# Patient Record
Sex: Female | Born: 1955
Health system: Southern US, Community
[De-identification: ages and names within clinical notes are randomized; demographics above are authoritative.]

## PROBLEM LIST (undated history)

## (undated) DIAGNOSIS — D136 Benign neoplasm of pancreas: Secondary | ICD-10-CM

## (undated) DIAGNOSIS — E119 Type 2 diabetes mellitus without complications: Secondary | ICD-10-CM

## (undated) DIAGNOSIS — F411 Generalized anxiety disorder: Secondary | ICD-10-CM

## (undated) DIAGNOSIS — Z9221 Personal history of antineoplastic chemotherapy: Secondary | ICD-10-CM

## (undated) DIAGNOSIS — E7800 Pure hypercholesterolemia, unspecified: Secondary | ICD-10-CM

## (undated) DIAGNOSIS — M810 Age-related osteoporosis without current pathological fracture: Secondary | ICD-10-CM

## (undated) DIAGNOSIS — Z923 Personal history of irradiation: Secondary | ICD-10-CM

## (undated) DIAGNOSIS — C50919 Malignant neoplasm of unspecified site of unspecified female breast: Secondary | ICD-10-CM

## (undated) DIAGNOSIS — R030 Elevated blood-pressure reading, without diagnosis of hypertension: Secondary | ICD-10-CM

## (undated) DIAGNOSIS — H04559 Acquired stenosis of unspecified nasolacrimal duct: Secondary | ICD-10-CM

## (undated) HISTORY — DX: Elevated blood-pressure reading, without diagnosis of hypertension: R03.0

## (undated) HISTORY — DX: Benign neoplasm of pancreas: D13.6

## (undated) HISTORY — PX: NASAL SEPTUM SURGERY: SHX37

## (undated) HISTORY — PX: BREAST BIOPSY: SHX20

## (undated) HISTORY — DX: Acquired stenosis of unspecified nasolacrimal duct: H04.559

## (undated) HISTORY — PX: BREAST LUMPECTOMY: SHX2

## (undated) HISTORY — DX: Generalized anxiety disorder: F41.1

## (undated) HISTORY — PX: OTHER SURGICAL HISTORY: SHX169

## (undated) HISTORY — DX: Pure hypercholesterolemia, unspecified: E78.00

## (undated) HISTORY — PX: BREAST EXCISIONAL BIOPSY: SUR124

## (undated) HISTORY — DX: Malignant neoplasm of unspecified site of unspecified female breast: C50.919

## (undated) HISTORY — DX: Type 2 diabetes mellitus without complications: E11.9

## (undated) HISTORY — DX: Age-related osteoporosis without current pathological fracture: M81.0

---

## 1993-09-08 HISTORY — PX: OTHER SURGICAL HISTORY: SHX169

## 1998-09-08 HISTORY — PX: OTHER SURGICAL HISTORY: SHX169

## 1998-09-20 ENCOUNTER — Inpatient Hospital Stay (HOSPITAL_COMMUNITY): Admission: RE | Admit: 1998-09-20 | Discharge: 1998-09-26 | Payer: Self-pay | Admitting: Surgery

## 1998-11-08 ENCOUNTER — Other Ambulatory Visit: Admission: RE | Admit: 1998-11-08 | Discharge: 1998-11-08 | Payer: Self-pay | Admitting: Gynecology

## 1999-11-22 ENCOUNTER — Encounter: Payer: Self-pay | Admitting: Gynecology

## 1999-11-22 ENCOUNTER — Encounter: Admission: RE | Admit: 1999-11-22 | Discharge: 1999-11-22 | Payer: Self-pay | Admitting: Gynecology

## 2000-04-07 ENCOUNTER — Other Ambulatory Visit: Admission: RE | Admit: 2000-04-07 | Discharge: 2000-04-07 | Payer: Self-pay | Admitting: Gynecology

## 2000-12-08 ENCOUNTER — Encounter: Admission: RE | Admit: 2000-12-08 | Discharge: 2000-12-08 | Payer: Self-pay | Admitting: Gynecology

## 2000-12-08 ENCOUNTER — Encounter: Payer: Self-pay | Admitting: Gynecology

## 2001-08-03 ENCOUNTER — Other Ambulatory Visit: Admission: RE | Admit: 2001-08-03 | Discharge: 2001-08-03 | Payer: Self-pay | Admitting: Gynecology

## 2001-12-13 ENCOUNTER — Encounter: Admission: RE | Admit: 2001-12-13 | Discharge: 2001-12-13 | Payer: Self-pay | Admitting: Gynecology

## 2001-12-13 ENCOUNTER — Encounter: Payer: Self-pay | Admitting: Gynecology

## 2002-12-15 ENCOUNTER — Encounter: Admission: RE | Admit: 2002-12-15 | Discharge: 2002-12-15 | Payer: Self-pay | Admitting: Gynecology

## 2002-12-15 ENCOUNTER — Other Ambulatory Visit: Admission: RE | Admit: 2002-12-15 | Discharge: 2002-12-15 | Payer: Self-pay | Admitting: Gynecology

## 2002-12-15 ENCOUNTER — Encounter: Payer: Self-pay | Admitting: Gynecology

## 2003-01-31 ENCOUNTER — Encounter: Admission: RE | Admit: 2003-01-31 | Discharge: 2003-05-01 | Payer: Self-pay | Admitting: Pulmonary Disease

## 2003-11-08 ENCOUNTER — Encounter: Admission: RE | Admit: 2003-11-08 | Discharge: 2003-11-08 | Payer: Self-pay | Admitting: Gynecology

## 2004-02-12 ENCOUNTER — Other Ambulatory Visit: Admission: RE | Admit: 2004-02-12 | Discharge: 2004-02-12 | Payer: Self-pay | Admitting: Gynecology

## 2004-09-10 ENCOUNTER — Ambulatory Visit: Payer: Self-pay | Admitting: Pulmonary Disease

## 2004-09-13 ENCOUNTER — Ambulatory Visit: Payer: Self-pay | Admitting: Pulmonary Disease

## 2004-11-11 ENCOUNTER — Ambulatory Visit: Payer: Self-pay | Admitting: Pulmonary Disease

## 2005-02-12 ENCOUNTER — Other Ambulatory Visit: Admission: RE | Admit: 2005-02-12 | Discharge: 2005-02-12 | Payer: Self-pay | Admitting: Gynecology

## 2005-02-12 ENCOUNTER — Ambulatory Visit: Payer: Self-pay | Admitting: Pulmonary Disease

## 2005-02-12 ENCOUNTER — Encounter: Admission: RE | Admit: 2005-02-12 | Discharge: 2005-02-12 | Payer: Self-pay | Admitting: Gynecology

## 2005-02-21 ENCOUNTER — Ambulatory Visit: Payer: Self-pay | Admitting: Pulmonary Disease

## 2005-08-21 ENCOUNTER — Ambulatory Visit: Payer: Self-pay | Admitting: Pulmonary Disease

## 2005-08-25 ENCOUNTER — Ambulatory Visit: Payer: Self-pay | Admitting: Pulmonary Disease

## 2005-11-04 ENCOUNTER — Ambulatory Visit: Payer: Self-pay | Admitting: Pulmonary Disease

## 2006-02-18 ENCOUNTER — Other Ambulatory Visit: Admission: RE | Admit: 2006-02-18 | Discharge: 2006-02-18 | Payer: Self-pay | Admitting: Gynecology

## 2006-02-20 ENCOUNTER — Encounter: Admission: RE | Admit: 2006-02-20 | Discharge: 2006-02-20 | Payer: Self-pay | Admitting: Gynecology

## 2006-06-04 ENCOUNTER — Ambulatory Visit: Payer: Self-pay | Admitting: Pulmonary Disease

## 2006-06-10 ENCOUNTER — Ambulatory Visit: Payer: Self-pay | Admitting: Pulmonary Disease

## 2006-12-23 ENCOUNTER — Ambulatory Visit: Payer: Self-pay | Admitting: Pulmonary Disease

## 2006-12-23 LAB — CONVERTED CEMR LAB
AST: 19 units/L (ref 0–37)
Albumin: 3.7 g/dL (ref 3.5–5.2)
Basophils Absolute: 0.1 10*3/uL (ref 0.0–0.1)
CO2: 32 meq/L (ref 19–32)
Calcium: 9.2 mg/dL (ref 8.4–10.5)
Creatinine, Ser: 0.8 mg/dL (ref 0.4–1.2)
Eosinophils Absolute: 0.3 10*3/uL (ref 0.0–0.6)
GFR calc non Af Amer: 81 mL/min
Glucose, Bld: 120 mg/dL — ABNORMAL HIGH (ref 70–99)
HDL: 47.9 mg/dL (ref >39.0)
Hemoglobin: 11.3 g/dL — ABNORMAL LOW (ref 12.0–15.0)
LDL Cholesterol: 59 mg/dL (ref 0–99)
Lymphocytes Relative: 31.8 % (ref 12.0–46.0)
MCHC: 33.8 g/dL (ref 30.0–36.0)
MCV: 87.1 fL (ref 78.0–100.0)
Monocytes Absolute: 0.7 10*3/uL (ref 0.2–0.7)
Monocytes Relative: 13.5 % — ABNORMAL HIGH (ref 3.0–11.0)
Neutrophils Relative %: 48.5 % (ref 43.0–77.0)
RDW: 12.5 % (ref 11.5–14.6)
Sodium: 144 meq/L (ref 135–145)
Total Bilirubin: 0.6 mg/dL (ref 0.3–1.2)
Triglycerides: 74 mg/dL (ref 0–149)
VLDL: 15 mg/dL (ref 0–40)
WBC: 5.5 10*3/uL (ref 4.5–10.5)

## 2007-02-17 ENCOUNTER — Encounter: Admission: RE | Admit: 2007-02-17 | Discharge: 2007-02-17 | Payer: Self-pay | Admitting: Gynecology

## 2007-02-17 ENCOUNTER — Other Ambulatory Visit: Admission: RE | Admit: 2007-02-17 | Discharge: 2007-02-17 | Payer: Self-pay | Admitting: Gynecology

## 2007-05-17 ENCOUNTER — Ambulatory Visit: Payer: Self-pay | Admitting: Pulmonary Disease

## 2007-05-17 LAB — CONVERTED CEMR LAB
BUN: 18 mg/dL (ref 6–23)
Basophils Absolute: 0 10*3/uL (ref 0.0–0.1)
Chloride: 103 meq/L (ref 96–112)
Eosinophils Absolute: 0.4 10*3/uL (ref 0.0–0.6)
Eosinophils Relative: 5.7 % — ABNORMAL HIGH (ref 0.0–5.0)
GFR calc Af Amer: 98 mL/min
GFR calc non Af Amer: 81 mL/min
HDL: 50 mg/dL (ref >39.0)
Hgb A1c MFr Bld: 6.6 % — ABNORMAL HIGH (ref 4.6–6.0)
LDL Cholesterol: 72 mg/dL (ref 0–99)
Lymphocytes Relative: 39 % (ref 12.0–46.0)
MCHC: 33.6 g/dL (ref 30.0–36.0)
Monocytes Absolute: 0.7 10*3/uL (ref 0.2–0.7)
Monocytes Relative: 11.3 % — ABNORMAL HIGH (ref 3.0–11.0)
Neutro Abs: 2.9 10*3/uL (ref 1.4–7.7)
Potassium: 4.1 meq/L (ref 3.5–5.1)
Sodium: 143 meq/L (ref 135–145)
Vit D, 1,25-Dihydroxy: 34 (ref 20–57)

## 2007-05-25 ENCOUNTER — Ambulatory Visit: Payer: Self-pay | Admitting: Pulmonary Disease

## 2007-12-13 ENCOUNTER — Telehealth: Payer: Self-pay | Admitting: Pulmonary Disease

## 2007-12-21 ENCOUNTER — Ambulatory Visit: Payer: Self-pay | Admitting: Pulmonary Disease

## 2008-01-27 ENCOUNTER — Encounter: Payer: Self-pay | Admitting: Pulmonary Disease

## 2008-02-01 LAB — CONVERTED CEMR LAB
AST: 19 units/L (ref 0–37)
Albumin: 3.9 g/dL (ref 3.5–5.2)
Alkaline Phosphatase: 62 units/L (ref 39–117)
BUN: 17 mg/dL (ref 6–23)
Bacteria, UA: NEGATIVE
Basophils Relative: 1.7 % — ABNORMAL HIGH (ref 0.0–1.0)
Bilirubin Urine: NEGATIVE
Bilirubin, Direct: 0.1 mg/dL (ref 0.0–0.3)
CO2: 33 meq/L — ABNORMAL HIGH (ref 19–32)
Chloride: 102 meq/L (ref 96–112)
Cholesterol: 116 mg/dL (ref 0–200)
Creatinine, Ser: 0.8 mg/dL (ref 0.4–1.2)
Crystals: NEGATIVE
Eosinophils Absolute: 0.5 10*3/uL (ref 0.0–0.7)
Eosinophils Relative: 7.1 % — ABNORMAL HIGH (ref 0.0–5.0)
GFR calc non Af Amer: 81 mL/min
HDL: 42.4 mg/dL (ref >39.0)
Ketones, ur: NEGATIVE mg/dL
MCV: 90 fL (ref 78.0–100.0)
Neutrophils Relative %: 42.6 % — ABNORMAL LOW (ref 43.0–77.0)
Nitrite: NEGATIVE
Potassium: 4.2 meq/L (ref 3.5–5.1)
RBC: 4.12 M/uL (ref 3.87–5.11)
TSH: 1.28 microintl units/mL (ref 0.35–5.50)
Total Bilirubin: 0.6 mg/dL (ref 0.3–1.2)
Total CHOL/HDL Ratio: 2.7
Total Protein: 7.2 g/dL (ref 6.0–8.3)
Triglycerides: 73 mg/dL (ref 0–149)
Urine Glucose: NEGATIVE mg/dL
VLDL: 15 mg/dL (ref 0–40)

## 2008-02-15 ENCOUNTER — Encounter: Payer: Self-pay | Admitting: Pulmonary Disease

## 2008-02-22 ENCOUNTER — Encounter: Admission: RE | Admit: 2008-02-22 | Discharge: 2008-02-22 | Payer: Self-pay | Admitting: Gynecology

## 2008-02-24 DIAGNOSIS — I10 Essential (primary) hypertension: Secondary | ICD-10-CM | POA: Insufficient documentation

## 2008-02-24 DIAGNOSIS — E7800 Pure hypercholesterolemia, unspecified: Secondary | ICD-10-CM | POA: Insufficient documentation

## 2008-02-24 DIAGNOSIS — J45909 Unspecified asthma, uncomplicated: Secondary | ICD-10-CM

## 2008-02-24 DIAGNOSIS — F411 Generalized anxiety disorder: Secondary | ICD-10-CM | POA: Insufficient documentation

## 2008-02-24 DIAGNOSIS — Z794 Long term (current) use of insulin: Secondary | ICD-10-CM

## 2008-02-24 DIAGNOSIS — E1165 Type 2 diabetes mellitus with hyperglycemia: Secondary | ICD-10-CM

## 2008-05-22 ENCOUNTER — Encounter: Payer: Self-pay | Admitting: Pulmonary Disease

## 2008-06-05 ENCOUNTER — Telehealth: Payer: Self-pay | Admitting: Pulmonary Disease

## 2008-06-12 ENCOUNTER — Ambulatory Visit: Payer: Self-pay | Admitting: Pulmonary Disease

## 2008-06-22 ENCOUNTER — Ambulatory Visit: Payer: Self-pay | Admitting: Pulmonary Disease

## 2008-06-22 DIAGNOSIS — Z853 Personal history of malignant neoplasm of breast: Secondary | ICD-10-CM

## 2008-06-22 DIAGNOSIS — J309 Allergic rhinitis, unspecified: Secondary | ICD-10-CM | POA: Insufficient documentation

## 2008-06-22 DIAGNOSIS — D136 Benign neoplasm of pancreas: Secondary | ICD-10-CM

## 2008-06-22 DIAGNOSIS — H04559 Acquired stenosis of unspecified nasolacrimal duct: Secondary | ICD-10-CM

## 2008-06-22 LAB — CONVERTED CEMR LAB
AST: 20 units/L (ref 0–37)
Albumin: 4 g/dL (ref 3.5–5.2)
Alkaline Phosphatase: 64 units/L (ref 39–117)
BUN: 18 mg/dL (ref 6–23)
Bilirubin, Direct: 0.2 mg/dL (ref 0.0–0.3)
Calcium: 10 mg/dL (ref 8.4–10.5)
Cholesterol: 137 mg/dL (ref 0–200)
Glucose, Bld: 163 mg/dL — ABNORMAL HIGH (ref 70–99)
HDL: 46.4 mg/dL (ref >39.0)
Hgb A1c MFr Bld: 6.8 % — ABNORMAL HIGH (ref 4.6–6.0)
LDL Cholesterol: 76 mg/dL (ref 0–99)
Potassium: 4 meq/L (ref 3.5–5.1)
Sodium: 142 meq/L (ref 135–145)
Total CHOL/HDL Ratio: 3
Total Protein: 7.2 g/dL (ref 6.0–8.3)
Triglycerides: 71 mg/dL (ref 0–149)

## 2009-01-18 ENCOUNTER — Telehealth: Payer: Self-pay | Admitting: Pulmonary Disease

## 2009-01-25 ENCOUNTER — Ambulatory Visit: Payer: Self-pay | Admitting: Pulmonary Disease

## 2009-02-22 LAB — CONVERTED CEMR LAB
Albumin: 4.1 g/dL (ref 3.5–5.2)
Alkaline Phosphatase: 63 units/L (ref 39–117)
BUN: 22 mg/dL (ref 6–23)
Basophils Relative: 0.3 % (ref 0.0–3.0)
Bilirubin, Direct: 0.2 mg/dL (ref 0.0–0.3)
Creatinine, Ser: 0.8 mg/dL (ref 0.4–1.2)
Eosinophils Absolute: 0.3 10*3/uL (ref 0.0–0.7)
Eosinophils Relative: 5.2 % — ABNORMAL HIGH (ref 0.0–5.0)
Glucose, Bld: 124 mg/dL — ABNORMAL HIGH (ref 70–99)
Hemoglobin: 12.6 g/dL (ref 12.0–15.0)
Hgb A1c MFr Bld: 7 % — ABNORMAL HIGH (ref 4.6–6.5)
LDL Cholesterol: 81 mg/dL (ref 0–99)
Lymphocytes Relative: 34.7 % (ref 12.0–46.0)
MCHC: 33.7 g/dL (ref 30.0–36.0)
MCV: 89.8 fL (ref 78.0–100.0)
Monocytes Absolute: 0.5 10*3/uL (ref 0.1–1.0)
Monocytes Relative: 8.1 % (ref 3.0–12.0)
RDW: 12.1 % (ref 11.5–14.6)
Sodium: 141 meq/L (ref 135–145)
Total Protein: 7.5 g/dL (ref 6.0–8.3)
Triglycerides: 104 mg/dL (ref 0.0–149.0)
VLDL: 20.8 mg/dL (ref 0.0–40.0)
Vit D, 25-Hydroxy: 69 ng/mL (ref 30–89)
WBC: 6.6 10*3/uL (ref 4.5–10.5)

## 2009-02-26 ENCOUNTER — Encounter: Admission: RE | Admit: 2009-02-26 | Discharge: 2009-02-26 | Payer: Self-pay | Admitting: Gynecology

## 2009-04-16 ENCOUNTER — Encounter: Payer: Self-pay | Admitting: Pulmonary Disease

## 2009-05-28 ENCOUNTER — Encounter: Payer: Self-pay | Admitting: Pulmonary Disease

## 2009-06-20 ENCOUNTER — Telehealth: Payer: Self-pay | Admitting: Pulmonary Disease

## 2009-07-06 ENCOUNTER — Encounter: Admission: RE | Admit: 2009-07-06 | Discharge: 2009-07-06 | Payer: Self-pay | Admitting: Otolaryngology

## 2009-07-11 ENCOUNTER — Ambulatory Visit: Payer: Self-pay | Admitting: Pulmonary Disease

## 2009-07-18 ENCOUNTER — Ambulatory Visit: Payer: Self-pay | Admitting: Pulmonary Disease

## 2009-07-18 LAB — CONVERTED CEMR LAB
BUN: 23 mg/dL (ref 6–23)
Chloride: 99 meq/L (ref 96–112)
Glucose, Bld: 97 mg/dL (ref 70–99)
HDL: 49.1 mg/dL (ref >39.00)
Sodium: 141 meq/L (ref 135–145)
Total CHOL/HDL Ratio: 3

## 2010-01-25 ENCOUNTER — Telehealth (INDEPENDENT_AMBULATORY_CARE_PROVIDER_SITE_OTHER): Payer: Self-pay | Admitting: *Deleted

## 2010-02-05 ENCOUNTER — Ambulatory Visit: Payer: Self-pay | Admitting: Pulmonary Disease

## 2010-02-19 LAB — CONVERTED CEMR LAB
AST: 21 units/L (ref 0–37)
Albumin: 4 g/dL (ref 3.5–5.2)
Alkaline Phosphatase: 71 units/L (ref 39–117)
Basophils Absolute: 0 10*3/uL (ref 0.0–0.1)
CO2: 32 meq/L (ref 19–32)
Chloride: 99 meq/L (ref 96–112)
Cholesterol: 153 mg/dL (ref 0–200)
Creatinine, Ser: 0.7 mg/dL (ref 0.4–1.2)
Creatinine,U: 133.2 mg/dL
Glucose, Bld: 144 mg/dL — ABNORMAL HIGH (ref 70–99)
HCT: 36.5 % (ref 36.0–46.0)
Hemoglobin: 12.2 g/dL (ref 12.0–15.0)
Hgb A1c MFr Bld: 7.4 % — ABNORMAL HIGH (ref 4.6–6.5)
MCV: 90.1 fL (ref 78.0–100.0)
Microalb Creat Ratio: 3.2 mg/g (ref 0.0–30.0)
Monocytes Relative: 13.3 % — ABNORMAL HIGH (ref 3.0–12.0)
Neutro Abs: 3.1 10*3/uL (ref 1.4–7.7)
Sodium: 140 meq/L (ref 135–145)
Total Bilirubin: 0.4 mg/dL (ref 0.3–1.2)
Total Protein: 7.3 g/dL (ref 6.0–8.3)
VLDL: 10.4 mg/dL (ref 0.0–40.0)

## 2010-03-13 ENCOUNTER — Encounter: Payer: Self-pay | Admitting: Pulmonary Disease

## 2010-03-13 ENCOUNTER — Encounter: Admission: RE | Admit: 2010-03-13 | Discharge: 2010-03-13 | Payer: Self-pay | Admitting: Gynecology

## 2010-06-04 ENCOUNTER — Encounter: Payer: Self-pay | Admitting: Pulmonary Disease

## 2010-07-01 ENCOUNTER — Ambulatory Visit: Payer: Self-pay | Admitting: Pulmonary Disease

## 2010-07-02 LAB — CONVERTED CEMR LAB
BUN: 20 mg/dL (ref 6–23)
CO2: 30 meq/L (ref 19–32)
Calcium: 9.6 mg/dL (ref 8.4–10.5)
Chloride: 102 meq/L (ref 96–112)
GFR calc non Af Amer: 81.23 mL/min (ref >60)
Glucose, Bld: 120 mg/dL — ABNORMAL HIGH (ref 70–99)

## 2010-07-09 ENCOUNTER — Ambulatory Visit: Payer: Self-pay | Admitting: Pulmonary Disease

## 2010-07-09 ENCOUNTER — Telehealth (INDEPENDENT_AMBULATORY_CARE_PROVIDER_SITE_OTHER): Payer: Self-pay | Admitting: *Deleted

## 2010-07-16 ENCOUNTER — Telehealth (INDEPENDENT_AMBULATORY_CARE_PROVIDER_SITE_OTHER): Payer: Self-pay | Admitting: *Deleted

## 2010-10-02 ENCOUNTER — Telehealth: Payer: Self-pay | Admitting: Pulmonary Disease

## 2010-10-08 NOTE — Letter (Signed)
Summary: [facility]  [facility]   Imported By: Sherian Rein 06/11/2010 10:46:56  _____________________________________________________________________  External Attachment:    Type:   Image     Comment:   External Document

## 2010-10-08 NOTE — Progress Notes (Signed)
Summary: 6 month labs  Phone Note Call from Patient Call back at [facility] Phone [phone]   Caller: Patient Call For: nadel Summary of Call: pt was just seen. she forgot to ask nurse if she needs to do labs 6 months from now. she says that she normally does labs q6 months. pt instructions were to return in 1 yr (appt has been made for this). if pt needs to do labs in 6 months- call home # w/ appt/ date/ time for labs and she will put this on her calendar. (ok to leave detailed msg on phone).  Initial call taken by: Tivis Ringer, CNA,  July 09, 2010 3:34 PM  Follow-up for Phone Call        pt states that she usually gets  fasting labs drawn every 6 months, but she forgot to ask DR. Kriste Basque about this at her appt today. Her instruction states to follow-up in 1 year. Does SN want her to come in 6 months to have labs drawn or wait until 1 year? Please advise. Carron Curie CMA  July 09, 2010 3:55 PM   Additional Follow-up for Phone Call Additional follow up Details #1::        per SN---ok for pt to have fasting labs in 6 months---lip-272.0/bmp-401.9/hepat-790.5/tsh-244.9/cbcd-285.9/a1c-250.00/urine microalbumin-250.00.  thanks Randell Loop CMA  July 15, 2010 3:24 PM     Additional Follow-up for Phone Call Additional follow up Details #2::    Orders placed in IDX for qst wk of May.  LMOM for pt to be aware of this. Follow-up by: Vernie Murders,  July 15, 2010 3:45 PM

## 2010-10-08 NOTE — Progress Notes (Signed)
Summary: lipitor rx-LMTCBx  1  Phone Note Call from Patient Call back at Home Phone [phone]   Caller: Patient Call For: nadel Summary of Call: pt requests a rx sent to [facility] on [address] for lipitor- needs 30 days supply for 1 yr- 10mg .  Initial call taken by: Tivis Ringer, CNA,  July 16, 2010 3:40 PM  Follow-up for Phone Call        We justy gave her rx for all meds on 07/09/10 including this one for a 90 day supply- LMOMTCB to clarify msg Vernie Murders  July 16, 2010 4:01 PM   Additional Follow-up for Phone Call Additional follow up Details #1::        Spoke with pt.  She states that she is needing 30 day supply of med so that she can get 4 dollar copay and also the rx just states "as directed"- so she had to admit to only taking 1/2 of 20 mg tablet and they state this needs to be changed to 10 mg daily and she can still get the 4 dollar copay.  Rx was sent to pharm. Additional Follow-up by: Vernie Murders,  July 16, 2010 4:14 PM    New/Updated Medications: LIPITOR 10 MG TABS (ATORVASTATIN CALCIUM) 1 by mouth once daily Prescriptions: LIPITOR 10 MG TABS (ATORVASTATIN CALCIUM) 1 by mouth once daily  #30 x 11   Entered by:   Vernie Murders   Authorized by:   Michele Mcalpine MD   Signed by:   Vernie Murders on 07/16/2010   Method used:   Electronically to        [facility] (retail)       [address]       [place], [place]  [address]       Ph: [phone]       Fax: [phone]   RxID:   [id number]

## 2010-10-08 NOTE — Assessment & Plan Note (Signed)
Summary: rov ///kp   CC:  Yearly ROV & review of mult medical problems....  History of Present Illness: 55 y/o WF here for a follow up visit... she has multiple medical problems as noted below...    ~  Oct09:  she has had a remarkably good year- no signif medical issues & doing well without new complaints or concerns... she would like to get her meds refilled today... she can't take the flu shots due to an allergy yrs ago to egg whites...   ~  November 10,2010:  she reports another good yr... she saw DrKraus for sporadic intense ear pain & he couldn't find anything wrong- did CT & this is reported neg as well... ?related to fillings/ dental? and she has appt soon to check this...    ~  July 09, 2010:  she continues to do well- no new complaints or concerns... she had labs earlier this yr- all looked good 7 BS was 120-140 w/ A1c= 7.1 to 7.4 on her Metformin & Januvia... she saw DrHecker 9/11- no retinopathy... breathing is good w/o exac & hasn't needed her Proair... she sees DrLomax for GYN & he follows her BMD as well.    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  ALLERGIC RHINITIS (ICD-477.9) - life-long hx of allergic rhinitis and asthma, with eczema as well... allergy testing in 80's w/ markedly pos reactions to trees, grasses, molds, dust, dogs, cats, feathers, shellfish, eggs & other foods including wheat, spinach, beans... ? if IgE / Rast checked- we don't have Vol I of her old chart avail... prev nasal polyp surgery in 1980's by DrKraus...  ASTHMA (ICD-493.90) - hx of asthma and allergies well controlled on SINGULAIR 10mg Qod & PROAIR HFA Prn (hasn't needed)... very active, exercising some & no problems w/ breathing...  ~  11/10: f/u CXR clear... we discussed trying to wean the Singulair slowly...  ~  11/11: states she's using the Singulair Qod & hasn't needed the Proair  rescue.  HYPERTENSION, BORDERLINE (ICD-401.9) - on diet alone... weight = 160#,  5\' 9"  tall for a BMI = 24...  BP= 122/84 today & she denies HA, fatigue, visual changes, CP, palipit, dizziness, syncope, dyspnea, edema, etc...   HYPERCHOLESTEROLEMIA (ICD-272.0) - on LIPITOR 20mg - 1/2 tab daily... takes med regularly & tolerates well...   ~  FLP 9/08 showed TChol 132, TG 52, HDL 50, LDL 72... rec- continue same med.  ~  FLP 10/09 showed TChol 137, TG 71, HDL 46, LDL 76... rec- continue same.  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 on METFORMIN ER 500mg - 2Tabs /d,  & JANUVIA 100mg /d... BS at home are 100-120 range... NOTE: she had SPLENECTOMY at the same time.  ~  labs 9/08 showed BS= 125,  HgA1c= 6.6.Marland KitchenMarland Kitchen rec- same meds + diet.  ~  Ophthalmology= DrHecker 9/09 no diabetic retinopathy...  ~  labs 10/09 showed BS= 163,  HgA1c= 6.8.Marland Kitchen. not as good w/ weight gain, rec- same med + diet.  ~  labs 11/10 showed BS= 97, A1c= 7.0.Marland KitchenMarland Kitchen reminder to avoid sweets etc...  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  Hx of ADENOCARCINOMA, BREAST (ICD-174.9) - right breast cancer diagnosed in 2000 w/ stage IIA disease... right lumpectomy & node bx by DrSreck, XRT by DrGoodchild, & CMF chemotherapy by DrSherrill... no known recurrence in f/u and "released" by DrSherrill.  ~  f/u Mammogram 6/10 at [facility] was neg...  OSTEOPOROSIS (ICD-733.00) - prev on Actonel 35mg /wk, calcium, vitamins, Vit D 2000 u daily (off Actonel now per DrLomax)...  ~  BMD by DrLomax 2007 showed TScores -2.1 in Spine, & -1.7 in [facility]...  ~  BMD by DrLomax in 2009- we don't have these  results...  ~  BMD by DrLomax 7/11 showed TScores -2.0 Spine, & -2.2 FemNeck  ANXIETY (ICD-300.00) - on ALPRAZOLAM 0.5mg  Prn...  Health Maintenance - takes ASA 81mg /d... GYN= DrLomax for PAP, Mammograms [facility]), and BMD's... had PNEUMOVAX 2000... can't get flu shots w/ allergy to eggs...   Allergies (verified): 1)  ! * Albumin  Comments:  Nurse/Medical Assistant: The patient's medications and allergies were reviewed with the patient and were updated in the Medication and Allergy Lists. 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 mother deceased age 43 from leukemia father deceased age 88 from liver cancer 1 sibling alive age 77 1 sibling alive age 32 1 sibling alive age 79 1 sibling alive age 30  Social History: Reviewed history from 07/18/2009 and no changes required. 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  The patient denies fever, chills, sweats, anorexia, fatigue, weakness, malaise, weight loss, sleep disorder, blurring, diplopia, eye irritation, eye discharge, vision loss, eye pain, photophobia, earache, ear discharge, tinnitus, decreased hearing, nasal congestion, nosebleeds, sore throat, hoarseness, chest pain,  palpitations, syncope, orthopnea, PND, peripheral edema, cough, dyspnea at rest, excessive sputum, hemoptysis, wheezing, pleurisy, nausea, vomiting, diarrhea, constipation, change in bowel habits, abdominal pain, melena, hematochezia, jaundice, gas/bloating, indigestion/heartburn, dysphagia, odynophagia, dysuria, hematuria, urinary frequency, urinary hesitancy, nocturia, incontinence, back pain, joint pain, joint swelling, muscle cramps, muscle weakness, stiffness, arthritis, sciatica, restless legs, leg pain at night, leg pain with exertion, rash, itching, dryness, suspicious lesions, paralysis, paresthesias, seizures, tremors, vertigo, transient blindness, frequent falls, frequent headaches, difficulty walking, depression, anxiety, memory loss, confusion, cold intolerance, heat intolerance, polydipsia, polyphagia, polyuria, unusual weight change, abnormal bruising, bleeding, enlarged lymph nodes, urticaria, allergic rash, hay fever, and recurrent infections.    Vital Signs:  Patient profile:   55 year old female Height:      69 inches Weight:      160.25 pounds BMI:     23.75 O2 Sat:      98 % on Room air Temp:     98.9 degrees F oral Pulse rate:   72 / minute BP sitting:   122 / 84  (left arm) Cuff size:   regular  Vitals Entered By: Boone Master CNA/MA (July 09, 2010 2:36 PM)  O2 Flow:  Room air  Physical Exam  Additional Exam:  WD, WN, 55 y/o WF in NAD... GENERAL:  Alert & oriented; pleasant & cooperative... HEENT:  [place]/AT, EOM-wnl, PERRLA, EACs-clear, TMs-wnl, NOSE-clear, THROAT-clear & wnl. NECK:  Supple w/ full ROM; no JVD; normal carotid impulses w/o bruits; no thyromegaly or nodules palpated; no lymphadenopathy. Scar on neck from remote birthmark removal... CHEST:  Clear to P & A; without wheezes/ rales/ or rhonchi. Right breast scar from lumpectomy... HEART:  Regular Rhythm; without murmurs/ rubs/ or gallops. ABDOMEN:  Scar of surg, soft & nontender; normal bowel sounds; no  organomegaly or masses detected. EXT: without deformities or arthritic changes; no varicose veins/ venous insuffic/ or edema. NEURO:  CN's intact; motor testing normal; sensory testing normal; gait normal & balance OK. DERM:  No lesions noted; no rash etc...    Impression & Recommendations:  Problem # 1:  ROUTINE GENERAL MEDICAL EXAM@HEALTH  CARE FACL (ICD-V70.0) Meds refilled per request...  Problem # 2:  ASTHMA (ICD-493.90) Stable>  continue meds. Her updated medication list for this problem includes:    Singulair 10 Mg Tabs (Montelukast sodium) .Marland Kitchen... Take 1 tablet by mouth once a day as needed    Proair Hfa 108 (90 Base) Mcg/act Aers (Albuterol sulfate) ..... Inhale 2 puffs q 4 h as needed for wheezing...  Problem # 3:  HYPERTENSION, BORDERLINE (ICD-401.9) Controlled on diet Rx...  Problem # 4:  HYPERCHOLESTEROLEMIA (ICD-272.0) Controlled on Lip20... Her updated medication list for this problem includes:    Lipitor 20 Mg Tabs (Atorvastatin calcium) .Marland Kitchen... Take as directed...  Problem # 5:  DIABETES MELLITUS (ICD-250.00) She is stable but would love to see the A1c in the 6's... continue meds + diet Rx... Her updated medication list for this problem includes:    Aspirin Adult Low Strength 81 Mg Tbec (Aspirin) .Marland Kitchen... Take 1 tablet by mouth once a day    Metformin Hcl 500 Mg Xr24h-tab (Metformin hcl) .Marland Kitchen... Take 2 tabs by mouth once daily...    Januvia 100 Mg Tabs (Sitagliptin phosphate) .Marland Kitchen... Take 1 tablet by mouth once a day  Problem # 6:  Hx of ADENOCARCINOMA, BREAST (ICD-174.9) Aware>  stable w/o known recurrence...  Problem # 7:  OSTEOPOROSIS (ICD-733.00) Managed by DrLomax... The following medications were removed from the medication list:    Actonel 35 Mg Tabs (Risedronate sodium) .Marland Kitchen... Take one tablet by mouth once every month as directed by dr. Nicholas Lose  Problem # 8:  OTHER MEDICAL PROBLEMS AS NOTED>>  Complete Medication List: 1)  Singulair 10 Mg Tabs (Montelukast  sodium) .... Take 1 tablet by mouth once a day as needed 2)  Proair Hfa 108 (90 Base) Mcg/act Aers (Albuterol sulfate) .... Inhale 2 puffs q 4 h as needed for wheezing... 3)  Aspirin Adult Low Strength 81 Mg Tbec (Aspirin) .... Take 1 tablet by mouth once a day 4)  Lipitor 20 Mg Tabs (Atorvastatin calcium) .... Take as directed... 5)  Metformin Hcl 500 Mg Xr24h-tab (Metformin hcl) .... Take 2 tabs by mouth once daily.Marland KitchenMarland Kitchen 6)  Januvia 100 Mg Tabs (Sitagliptin phosphate) .... Take 1 tablet by mouth once a day 7)  Calcium 600/vitamin D 600-400 Mg-unit Tabs (Calcium carbonate-vitamin d) .... Take 2 tablets by mouth once daily as directed by dr. Nicholas Lose 8)  Daily Multiple Vitamins Tabs (Multiple vitamin) .... Take 1 tablet by mouth once a day 9)  Vitamin D3 2000 Unit  Caps (Cholecalciferol) .... Take 1 capsule by mouth once a day 10)  Alprazolam 0.5 Mg Tabs (Alprazolam) .... Take 1/2 to 1 tablet by mouth three times a day as needed...  Patient Instructions: 1)  Today we updated your med list- see below.... 2)  We refilled your meds for 90d supplies as requested... 3)  Keep up the great job w/ diet & weight bearing exercise... 4)  Call for any problems.Marland KitchenMarland Kitchen 5)  Please schedule a follow-up appointment in 1 year. Prescriptions: ALPRAZOLAM 0.5 MG  TABS (ALPRAZOLAM) take 1/2 to 1 tablet by mouth three times a day as needed...  #90 x 6   Entered and Authorized by:   Michele Mcalpine MD   Signed by:   Michele Mcalpine MD on 07/09/2010   Method used:   Print then Give to Patient   RxID:   [id number] JANUVIA 100 MG  TABS (SITAGLIPTIN PHOSPHATE) Take 1 tablet by mouth once a day  #90 x 4   Entered and Authorized by:   Michele Mcalpine MD   Signed by:   Michele Mcalpine MD on 07/09/2010   Method used:   Print then Give to Patient   RxID:   [id number] METFORMIN HCL 500 MG XR24H-TAB (METFORMIN HCL) take 2 tabs by mouth once daily...  #180 x 4   Entered and Authorized by:   Michele Mcalpine MD   Signed by:   Michele Mcalpine MD on 07/09/2010   Method used:   Print then Give to Patient   RxID:   [id number] LIPITOR 20 MG TABS (ATORVASTATIN CALCIUM) take as directed...  #90 x 4   Entered and Authorized by:   Michele Mcalpine MD   Signed by:   Michele Mcalpine MD on 07/09/2010   Method used:   Print then Give to Patient   RxID:   [id number] PROAIR HFA 108 (90 BASE) MCG/ACT AERS (ALBUTEROL SULFATE) inhale 2 puffs Q 4 H as needed for wheezing...  #1 x prn   Entered and Authorized by:   Michele Mcalpine MD   Signed by:   Michele Mcalpine MD on 07/09/2010   Method used:   Print then Give to Patient   RxID:   [id number] SINGULAIR 10 MG  TABS (MONTELUKAST SODIUM) Take 1 tablet by mouth once a day as needed  #90 x 4   Entered and Authorized by:   Michele Mcalpine MD   Signed by:   Michele Mcalpine MD on 07/09/2010   Method used:   Print then Give to Patient   RxID:   [id number]

## 2010-10-08 NOTE — Progress Notes (Signed)
Summary: set up labs  Phone Note Call from Patient Call back at Work Phone [phone]   Caller: Patient Call For: nadel Summary of Call: pt request that her "6 months fasting labs be set up".  Initial call taken by: Tivis Ringer, CNA,  Jan 25, 2010 9:38 AM  Follow-up for Phone Call        pls mark labs for pt and put in idx   Philipp Deputy CMA  Jan 25, 2010 9:48 AM    per SN---ok for pt to have labs done--flp-bmp-hepat-cbcd-tsh--v70.0  and a1c and microalbumin-250.00.  lmomtcb for pt to see when she will come in for the blood work. Randell Loop CMA  Jan 25, 2010 2:30 PM   Additional Follow-up for Phone Call Additional follow up Details #1::        labs are in computer for pt on 5-31 Randell Loop [facility]  Feb 01, 2010 1:57 PM

## 2010-10-10 NOTE — Progress Notes (Signed)
Summary: appointment today  Phone Note Call from Patient   Caller: Patient Call For: DR Lanea Vankirk Summary of Call: Patient phoned and would like to be seen today. Dr. Kriste Basque nor Babette Relic has anything today.  She started with head congestion but thinks it has moved to her lungs. She was coughing up a dark mucus this morning. She can be reached at [phone]. Pt uses [facility] at [facility] Initial call taken by: Vedia Coffer,  October 02, 2010 9:16 AM  Follow-up for Phone Call        Spoke with pt.  She is c/o prod cough with brown sputum since this am.  She states that she started to have sinus pressure and congestion 4 days ago and now it has moved to chest.  Chest feels tight.  She requested ov, I offered her appt with TP for tommorrow and she states that this can not wait.  Pls advise thanks! allergic to albumin Follow-up by: Vernie Murders,  October 02, 2010 9:29 AM  Additional Follow-up for Phone Call Additional follow up Details #1::        per SN---ok for pt to have augmentin 875  #14  1 by mouth two times a day  and use mucinex 600mg   2 by mouth two times a day with plenty of fluids.  called and spoke with pt and she is aware of med recs per SN---will call for any further problems Randell Loop CMA  October 02, 2010 1:44 PM     New/Updated Medications: AUGMENTIN 875-125 MG TABS (AMOXICILLIN-POT CLAVULANATE) take one tablet by mouth two times a day until gone---generic is ok Prescriptions: AUGMENTIN 875-125 MG TABS (AMOXICILLIN-POT CLAVULANATE) take one tablet by mouth two times a day until gone---generic is ok  #14 x 0   Entered by:   Randell Loop CMA   Authorized by:   Michele Mcalpine MD   Signed by:   Randell Loop CMA on 10/02/2010   Method used:   Electronically to        [facility] (retail)       [address]       [place], [place]  [address]       Ph: [phone]       Fax: [phone]   RxID:   [id number]

## 2011-01-07 ENCOUNTER — Other Ambulatory Visit: Payer: Self-pay

## 2011-01-07 ENCOUNTER — Other Ambulatory Visit: Payer: Self-pay | Admitting: Pulmonary Disease

## 2011-01-07 DIAGNOSIS — I10 Essential (primary) hypertension: Secondary | ICD-10-CM

## 2011-01-07 DIAGNOSIS — E039 Hypothyroidism, unspecified: Secondary | ICD-10-CM

## 2011-01-07 DIAGNOSIS — E119 Type 2 diabetes mellitus without complications: Secondary | ICD-10-CM

## 2011-01-07 DIAGNOSIS — R748 Abnormal levels of other serum enzymes: Secondary | ICD-10-CM

## 2011-01-07 DIAGNOSIS — E785 Hyperlipidemia, unspecified: Secondary | ICD-10-CM

## 2011-01-07 DIAGNOSIS — D649 Anemia, unspecified: Secondary | ICD-10-CM

## 2011-01-23 ENCOUNTER — Telehealth: Payer: Self-pay | Admitting: Pulmonary Disease

## 2011-01-23 NOTE — Telephone Encounter (Signed)
Called and spoke with pt and she is aware that her lab order is still in the computer and she can come in still for her labs.  Pt voiced her understanding.

## 2011-01-27 ENCOUNTER — Other Ambulatory Visit (INDEPENDENT_AMBULATORY_CARE_PROVIDER_SITE_OTHER): Payer: BC Managed Care – PPO

## 2011-01-27 DIAGNOSIS — D649 Anemia, unspecified: Secondary | ICD-10-CM

## 2011-01-27 DIAGNOSIS — E119 Type 2 diabetes mellitus without complications: Secondary | ICD-10-CM

## 2011-01-27 DIAGNOSIS — R748 Abnormal levels of other serum enzymes: Secondary | ICD-10-CM

## 2011-01-27 DIAGNOSIS — E039 Hypothyroidism, unspecified: Secondary | ICD-10-CM

## 2011-01-27 DIAGNOSIS — E785 Hyperlipidemia, unspecified: Secondary | ICD-10-CM

## 2011-01-27 DIAGNOSIS — I10 Essential (primary) hypertension: Secondary | ICD-10-CM

## 2011-01-27 LAB — CBC WITH DIFFERENTIAL/PLATELET
Eosinophils Absolute: 0.4 10*3/uL (ref 0.0–0.7)
HCT: 38.2 % (ref 36.0–46.0)
Lymphs Abs: 2.4 10*3/uL (ref 0.7–4.0)
MCHC: 33.8 g/dL (ref 30.0–36.0)
Monocytes Relative: 11.2 % (ref 3.0–12.0)
Platelets: 314 10*3/uL (ref 150.0–400.0)
RDW: 13.5 % (ref 11.5–14.6)
WBC: 7.1 10*3/uL (ref 4.5–10.5)

## 2011-01-27 LAB — BASIC METABOLIC PANEL
Calcium: 9.8 mg/dL (ref 8.4–10.5)
Creatinine, Ser: 0.8 mg/dL (ref 0.4–1.2)
Glucose, Bld: 131 mg/dL — ABNORMAL HIGH (ref 70–99)

## 2011-01-27 LAB — LIPID PANEL
HDL: 49.9 mg/dL (ref >39.00)
Total CHOL/HDL Ratio: 3
Triglycerides: 104 mg/dL (ref 0.0–149.0)

## 2011-01-27 LAB — MICROALBUMIN / CREATININE URINE RATIO
Creatinine,U: 269.4 mg/dL
Microalb Creat Ratio: 2.3 mg/g (ref 0.0–30.0)
Microalb, Ur: 6.2 mg/dL — ABNORMAL HIGH (ref 0.0–1.9)

## 2011-01-27 LAB — HEPATIC FUNCTION PANEL: Bilirubin, Direct: 0 mg/dL (ref 0.0–0.3)

## 2011-02-03 ENCOUNTER — Other Ambulatory Visit: Payer: Self-pay | Admitting: Pulmonary Disease

## 2011-02-05 ENCOUNTER — Other Ambulatory Visit: Payer: Self-pay | Admitting: Pulmonary Disease

## 2011-02-05 DIAGNOSIS — Z0000 Encounter for general adult medical examination without abnormal findings: Secondary | ICD-10-CM | POA: Insufficient documentation

## 2011-02-11 ENCOUNTER — Other Ambulatory Visit: Payer: Self-pay | Admitting: *Deleted

## 2011-02-11 MED ORDER — ALPRAZOLAM 0.5 MG PO TABS: 0.5000 mg | ORAL_TABLET | Freq: Three times a day (TID) | ORAL | Status: DC | PRN

## 2011-03-17 ENCOUNTER — Other Ambulatory Visit: Payer: Self-pay | Admitting: Gynecology

## 2011-03-17 DIAGNOSIS — Z1231 Encounter for screening mammogram for malignant neoplasm of breast: Secondary | ICD-10-CM

## 2011-03-26 ENCOUNTER — Ambulatory Visit
Admission: RE | Admit: 2011-03-26 | Discharge: 2011-03-26 | Disposition: A | Discharge status: Home / Self Care | Payer: BC Managed Care – PPO | Source: Ambulatory Visit | Attending: Gynecology | Admitting: Gynecology

## 2011-03-26 DIAGNOSIS — Z1231 Encounter for screening mammogram for malignant neoplasm of breast: Secondary | ICD-10-CM

## 2011-04-21 ENCOUNTER — Telehealth: Payer: Self-pay | Admitting: Pulmonary Disease

## 2011-04-21 MED ORDER — ATORVASTATIN CALCIUM 10 MG PO TABS: ORAL_TABLET | ORAL | Status: DC

## 2011-04-21 NOTE — Telephone Encounter (Signed)
Spoke with pt and is aware generic lipitor was sent to pharmacy. Pt verbalized understanding and needed nothing else further

## 2011-06-30 ENCOUNTER — Other Ambulatory Visit (INDEPENDENT_AMBULATORY_CARE_PROVIDER_SITE_OTHER): Payer: BC Managed Care – PPO

## 2011-06-30 DIAGNOSIS — Z0000 Encounter for general adult medical examination without abnormal findings: Secondary | ICD-10-CM

## 2011-06-30 LAB — HEPATIC FUNCTION PANEL
AST: 20 U/L (ref 0–37)
Albumin: 4 g/dL (ref 3.5–5.2)
Total Bilirubin: 0.5 mg/dL (ref 0.3–1.2)

## 2011-06-30 LAB — LIPID PANEL
HDL: 52.7 mg/dL (ref >39.00)
LDL Cholesterol: 85 mg/dL (ref 0–99)
Total CHOL/HDL Ratio: 3
Triglycerides: 75 mg/dL (ref 0.0–149.0)
VLDL: 15 mg/dL (ref 0.0–40.0)

## 2011-06-30 LAB — BASIC METABOLIC PANEL
CO2: 32 mEq/L (ref 19–32)
Calcium: 9.3 mg/dL (ref 8.4–10.5)
Chloride: 100 mEq/L (ref 96–112)
Glucose, Bld: 156 mg/dL — ABNORMAL HIGH (ref 70–99)
Potassium: 4.4 mEq/L (ref 3.5–5.1)
Sodium: 139 mEq/L (ref 135–145)

## 2011-06-30 LAB — CBC WITH DIFFERENTIAL/PLATELET
Eosinophils Relative: 7.8 % — ABNORMAL HIGH (ref 0.0–5.0)
HCT: 37.4 % (ref 36.0–46.0)
Hemoglobin: 12.6 g/dL (ref 12.0–15.0)
Lymphs Abs: 2.5 10*3/uL (ref 0.7–4.0)
Monocytes Relative: 12.6 % — ABNORMAL HIGH (ref 3.0–12.0)
Neutro Abs: 3.2 10*3/uL (ref 1.4–7.7)
WBC: 7.3 10*3/uL (ref 4.5–10.5)

## 2011-06-30 LAB — TSH: TSH: 1.73 u[IU]/mL (ref 0.35–5.50)

## 2011-07-08 ENCOUNTER — Encounter: Payer: Self-pay | Admitting: Pulmonary Disease

## 2011-07-08 ENCOUNTER — Ambulatory Visit (INDEPENDENT_AMBULATORY_CARE_PROVIDER_SITE_OTHER)
Admission: RE | Admit: 2011-07-08 | Discharge: 2011-07-08 | Disposition: A | Discharge status: Home / Self Care | Payer: BC Managed Care – PPO | Source: Ambulatory Visit | Attending: Pulmonary Disease | Admitting: Pulmonary Disease

## 2011-07-08 ENCOUNTER — Ambulatory Visit (INDEPENDENT_AMBULATORY_CARE_PROVIDER_SITE_OTHER): Payer: BC Managed Care – PPO | Admitting: Pulmonary Disease

## 2011-07-08 VITALS — BP 130/86 | HR 83 | Temp 96.7°F | Ht 69.0 in | Wt 169.4 lb

## 2011-07-08 DIAGNOSIS — F411 Generalized anxiety disorder: Secondary | ICD-10-CM

## 2011-07-08 DIAGNOSIS — J45909 Unspecified asthma, uncomplicated: Secondary | ICD-10-CM

## 2011-07-08 DIAGNOSIS — C50919 Malignant neoplasm of unspecified site of unspecified female breast: Secondary | ICD-10-CM

## 2011-07-08 DIAGNOSIS — Z0000 Encounter for general adult medical examination without abnormal findings: Secondary | ICD-10-CM

## 2011-07-08 DIAGNOSIS — M810 Age-related osteoporosis without current pathological fracture: Secondary | ICD-10-CM

## 2011-07-08 DIAGNOSIS — I10 Essential (primary) hypertension: Secondary | ICD-10-CM

## 2011-07-08 DIAGNOSIS — E119 Type 2 diabetes mellitus without complications: Secondary | ICD-10-CM

## 2011-07-08 DIAGNOSIS — E7800 Pure hypercholesterolemia, unspecified: Secondary | ICD-10-CM

## 2011-07-08 DIAGNOSIS — D136 Benign neoplasm of pancreas: Secondary | ICD-10-CM

## 2011-07-08 MED ORDER — DICYCLOMINE HCL 20 MG PO TABS: 20.0000 mg | ORAL_TABLET | Freq: Three times a day (TID) | ORAL | Status: DC | PRN

## 2011-07-08 MED ORDER — GLIMEPIRIDE 1 MG PO TABS: 1.0000 mg | ORAL_TABLET | Freq: Every day | ORAL | Status: DC

## 2011-07-08 MED ORDER — METFORMIN HCL 500 MG PO TABS: 1000.0000 mg | ORAL_TABLET | Freq: Every day | ORAL | Status: DC

## 2011-07-08 MED ORDER — MONTELUKAST SODIUM 10 MG PO TABS: 10.0000 mg | ORAL_TABLET | Freq: Every day | ORAL | Status: DC

## 2011-07-08 MED ORDER — ALPRAZOLAM 0.5 MG PO TABS: 0.5000 mg | ORAL_TABLET | Freq: Three times a day (TID) | ORAL | Status: DC | PRN

## 2011-07-08 MED ORDER — SITAGLIPTIN PHOSPHATE 100 MG PO TABS: 100.0000 mg | ORAL_TABLET | Freq: Every day | ORAL | Status: DC

## 2011-07-08 MED ORDER — ALBUTEROL SULFATE HFA 108 (90 BASE) MCG/ACT IN AERS: 2.0000 | INHALATION_SPRAY | Freq: Four times a day (QID) | RESPIRATORY_TRACT | Status: DC | PRN

## 2011-07-08 MED ORDER — ATORVASTATIN CALCIUM 10 MG PO TABS: ORAL_TABLET | ORAL | Status: DC

## 2011-07-08 NOTE — Patient Instructions (Signed)
Today we updated your med list in our EPIC system...    We decided to adjust your Diabetic meds as follows>>       Take the METFORMIN 500mg  - on etab twice daily at breakfast & dinner...       Add the new GLIMEPIRIDE 1mg  at breakfast...        And take the JANUVIA 100mg  at dinner...  We also wrote a new prescription for BENTYL (Dicyclomine) 20mg  to take up to 3 times daily as needed for abd cramping...    Don't forget to try the Metamucil/ Fiberall/ citrucel etc... approx 1 tablesp or so in water, juice, or other liq daily & adjust the amt of fluid to help regulate your stools...    You may also try GAS-X &/ or PHAZYME up to 4 times daily as needed for gas...  Today we did your follow up CXR...    Please call the PHONE TREE in a few days for your results...    Dial [id number] & when prompted enter your patient number followed by the # symbol...    Your patient number is:   [id number]    Keep up the good exercise program + your diet etc...  Call for any questions...  Let's plan a follow up visit to recheck your BS/ A1c in about 4-50months.Marland KitchenMarland Kitchen

## 2011-07-08 NOTE — Progress Notes (Signed)
Subjective:    Patient ID: Amber Washington, female    DOB: March 11, 1956, 55 y.o.   MRN: [id number]  HPI 55 y/o WF here for a follow up visit... she has multiple medical problems as noted below...   ~  Oct09:  she has had a remarkably good year- no signif medical issues & doing well without new complaints or concerns... she would like to get her meds refilled today... she can't take the flu shots due to an allergy yrs ago to egg whites...  ~  November 10,2010:  she reports another good yr... she saw DrKraus for sporadic intense ear pain & he couldn't find anything wrong- did CT & this is reported neg as well... ?related to fillings/ dental? and she has appt soon to check this...   ~  July 09, 2010:  she continues to do well- no new complaints or concerns... she had labs earlier this yr- all looked good & BS was 120-140 w/ A1c= 7.1 to 7.4 on her Metformin & Januvia... she saw DrHecker 9/11- no retinopathy... breathing is good w/o exac & hasn't needed her Proair... she sees DrLomax for GYN & he follows her BMD as well.  ~  July 08, 2011:  Yearly ROV & her CC= IBS-like symptoms w/ mild cramping & irreg BMs w/ gas etc (she has seen DrMedoff in the past);  otherw she reports breathing is good w/o asthma exac, BP controlled on diet rx alone, FLP looks good on Lip10, but BS/A1c are up w/ her 10# wt gain & we decided to add Glimep1mg /d (see below)... 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  ALLERGIC RHINITIS (ICD-477.9) - life-long hx of allergic rhinitis and asthma, with eczema as well... allergy testing in 80's w/ markedly pos reactions to trees, grasses, molds, dust, dogs, cats, feathers, shellfish, eggs & other foods including wheat, spinach, beans... ? if IgE / Rast checked- we don't have Vol I of her old chart avail... prev nasal  polyp surgery in 1980's by DrKraus...  ASTHMA (ICD-493.90) - hx of asthma and allergies well controlled on SINGULAIR 10mg Qod & PROAIR HFA Prn (hasn't needed)... very active, exercising some & no problems w/ breathing... ~  11/10: f/u CXR clear... we discussed trying to wean the Singulair slowly... ~  11/11: states she's using the Singulair Qod & hasn't needed the Proair rescue.  HYPERTENSION, BORDERLINE (ICD-401.9) - on diet alone... ~  11/11:  BP= 122/84 today (wt=160#) & she denies HA, fatigue, visual changes, CP, palipit, dizziness, syncope, dyspnea, edema, etc...  ~  10/12:  BP= 130/86 today (wt=169#) & she remains asymptomatic...  HYPERCHOLESTEROLEMIA (ICD-272.0) - on LIPITOR 10mg  daily... takes med regularly & tolerates well...  ~  FLP 9/08 showed TChol 132, TG 52, HDL 50, LDL 72... rec- continue same med. ~  FLP 10/09 showed TChol 137, TG 71, HDL 46, LDL 76... rec- continue same. 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 on METFORMIN ER 500mg - 2Tabs /d,  & JANUVIA 100mg /d... BS at home are  100-120 range... NOTE: she had SPLENECTOMY at the same time. ~  labs 9/08 showed BS= 125,  HgA1c= 6.6.Marland KitchenMarland Kitchen rec- same meds + diet. ~  Ophthalmology= DrHecker 9/09 no diabetic retinopathy... ~  labs 10/09 showed BS= 163,  HgA1c= 6.8.Marland Kitchen. not as good w/ weight gain, rec- same med + diet. ~  labs 11/10 showed BS= 97, A1c= 7.0.Marland KitchenMarland Kitchen reminder to avoid sweets etc... ~  labs in 2011 showed BS= 120-140, A1c= 7.1-7.4 on MetformER500-2Qam + Januv100/d. ~  9/12:  Neg Ophthalmology eval by DrHecker, no retinopathy... ~  Labs 10/12 showed BS= 156, A1c= 7.6... We decided to add GLIMEPIRIDE 1mg Qam + diet & get wt back down.  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  IBS & COLON POLYPS >> followed by [facility] but we don't have notes from him... ~  Colonoscopy 8/10 showed one 8mm sessile polyp in asc colon> bx= polypoid colonic mucosa w/ benign lymphoid aggregates...  Hx of ADENOCARCINOMA, BREAST (ICD-174.9) - right breast cancer diagnosed in 2000 w/ stage IIA disease... right lumpectomy & node bx by DrSreck, XRT by DrGoodchild, & CMF chemotherapy by DrSherrill... no known recurrence in f/u and "released" by DrSherrill. ~  f/u Mamm[facility] 6/10 at [facility] was neg...  OSTEOPOROSIS (ICD-733.00) - prev on Actonel 35mg /wk, calcium, vitamins, Vit D 2000 u daily (off Actonel now per DrLomax)... ~  BMD by DrLomax 2007 showed TScores -2.1 in Spine, & -1.7 in [facility]... ~  BMD by DrLomax in 2009- we don't have these results... ~  BMD by DrLomax 7/11 showed TScores -2.0 Spine, & -2.2 FemNeck  ANXIETY (ICD-300.00) - on ALPRAZOLAM 0.5mg  Prn...  Health Maintenance - takes ASA 81mg /d... GYN= DrLomax for PAP, Mammograms [facility]), and BMD's... had PNEUMOVAX 2000... can't get flu shots w/ allergy to eggs...   Past Surgical History  Procedure Date  . Surgery on neck for removal of birthmark   . Right breast cancer sugery 1995  . Distal pancreatectomy and splenectomy for mucinous cystadenoma of pancreas 2000    Outpatient Encounter Prescriptions as of 07/08/2011  Medication Sig Dispense Refill  . albuterol (PROVENTIL HFA;VENTOLIN HFA) 108 (90 BASE) MCG/ACT inhaler Inhale 2 puffs into the lungs every 6 (six) hours as needed.        . ALPRAZolam (XANAX) 0.5 MG tablet Take 1 tablet (0.5 mg total) by mouth 3 (three) times daily as needed for sleep or anxiety.  90 tablet  5  . aspirin 81 MG tablet Take 81 mg by mouth daily.        Marland Kitchen atorvastatin (LIPITOR) 10 MG tablet Take 1 tablet by mouth once a day  30 tablet  5  . Calcium Carbonate-Vitamin D  (CALCIUM 600+D) 600-400 MG-UNIT per tablet Take 1 tablet by mouth daily. Per Dr. Nicholas Lose      . Cholecalciferol (VITAMIN D) 2000 UNITS CAPS Take 1 capsule by mouth daily.        . metFORMIN (GLUCOPHAGE) 500 MG tablet Take 1,000 mg by mouth daily with breakfast.        . montelukast (SINGULAIR) 10 MG tablet Take 10 mg by mouth at bedtime.        . Multiple Vitamins-Minerals (MULTIVITAMIN & MINERAL PO) Take 1 tablet by mouth daily.        . sitaGLIPtin (JANUVIA) 100 MG tablet Take 100 mg by mouth daily.  Allergies  Allergen Reactions  . Other     Flu vaccine due to egg whites that she was allergic to as a child    Current Medications, Allergies, Past Medical History, Past Surgical History, Family History, and Social History were reviewed in [employer] record.    Review of Systems        The patient complains of dyspnea on exertion.  The patient denies fever, chills, sweats, anorexia, fatigue, weakness, malaise, weight loss, sleep disorder, blurring, diplopia, eye irritation, eye discharge, vision loss, eye pain, photophobia, earache, ear discharge, tinnitus, decreased hearing, nasal congestion, nosebleeds, sore throat, hoarseness, chest pain, palpitations, syncope, orthopnea, PND, peripheral edema, cough, dyspnea at rest, excessive sputum, hemoptysis, wheezing, pleurisy, nausea, vomiting, diarrhea, constipation, change in bowel habits, abdominal pain, melena, hematochezia, jaundice, gas/bloating, indigestion/heartburn, dysphagia, odynophagia, dysuria, hematuria, urinary frequency, urinary hesitancy, nocturia, incontinence, back pain, joint pain, joint swelling, muscle cramps, muscle weakness, stiffness, arthritis, sciatica, restless legs, leg pain at night, leg pain with exertion, rash, itching, dryness, suspicious lesions, paralysis, paresthesias, seizures, tremors, vertigo, transient blindness, frequent falls, frequent headaches, difficulty walking, depression,  anxiety, memory loss, confusion, cold intolerance, heat intolerance, polydipsia, polyphagia, polyuria, unusual weight change, abnormal bruising, bleeding, enlarged lymph nodes, urticaria, allergic rash, hay fever, and recurrent infections.   Objective:   Physical Exam     WD, WN, 55 y/o WF in NAD... GENERAL:  Alert & oriented; pleasant & cooperative... HEENT:  [place]/AT, EOM-wnl, PERRLA, EACs-clear, TMs-wnl, NOSE-clear, THROAT-clear & wnl. NECK:  Supple w/ full ROM; no JVD; normal carotid impulses w/o bruits; no thyromegaly or nodules palpated; no lymphadenopathy. Scar on neck from remote birthmark removal... CHEST:  Clear to P & A; without wheezes/ rales/ or rhonchi. Right breast scar from lumpectomy... HEART:  Regular Rhythm; without murmurs/ rubs/ or gallops. ABDOMEN:  Scar of surg, soft & nontender; normal bowel sounds; no organomegaly or masses detected. EXT: without deformities or arthritic changes; no varicose veins/ venous insuffic/ or edema. NEURO:  CN's intact; motor testing normal; sensory testing normal; gait normal & balance OK. DERM:  No lesions noted; no rash etc...   Assessment & Plan:   AR & ASTHMA>  Stable on Singulair Qod & Proair as needed; continue same...  HBP>  Controlled on diet alone, but caution w/ 10# wt gain; she will try harder for wt loss & monitor BP at home...  CHOL>  Stable on Lip10 + diet...  DM>  Her A1c is up to 7.6 on the Metformin+Januvia; we discussed diet/ exercise/ get wt down but decided to add GLIMEPIRIDE 1mg  Qam...  GI> Prob IBS & Polyps>  Followed by [facility] & up to date on colon screen...  Hx Breast Cancer>  No known recurrence, see details above, she gets yearly f/u mammograms etc...  Osteopenia>  Prev on Actonel but now drug holiday per [facility]; he does her BMDs & we reviewed these reportsw...  Other medical problems as noted.Marland KitchenMarland Kitchen

## 2011-07-09 ENCOUNTER — Other Ambulatory Visit: Payer: Self-pay | Admitting: Pulmonary Disease

## 2011-07-09 MED ORDER — METFORMIN HCL ER 500 MG PO TB24: 1000.0000 mg | ORAL_TABLET | Freq: Every day | ORAL | Status: DC

## 2011-07-09 NOTE — Telephone Encounter (Signed)
Last prescription was sent for regular Metformin and not the extended release. At 07/08/11 OV, Sn stated that the pt is taking Metformin ER 500 mg 2 tabs by mouth daily. We will make the correction to the pt's medication list and send a new prescription to the pharmacy.

## 2011-07-23 ENCOUNTER — Encounter: Payer: Self-pay | Admitting: Pulmonary Disease

## 2011-12-30 ENCOUNTER — Other Ambulatory Visit (INDEPENDENT_AMBULATORY_CARE_PROVIDER_SITE_OTHER): Payer: BC Managed Care – PPO

## 2011-12-30 DIAGNOSIS — I10 Essential (primary) hypertension: Secondary | ICD-10-CM

## 2011-12-30 DIAGNOSIS — E119 Type 2 diabetes mellitus without complications: Secondary | ICD-10-CM

## 2011-12-30 LAB — BASIC METABOLIC PANEL
CO2: 31 mEq/L (ref 19–32)
Calcium: 9.9 mg/dL (ref 8.4–10.5)
Creatinine, Ser: 0.8 mg/dL (ref 0.4–1.2)

## 2011-12-30 LAB — HEMOGLOBIN A1C: Hgb A1c MFr Bld: 6.7 % — ABNORMAL HIGH (ref 4.6–6.5)

## 2012-01-06 ENCOUNTER — Ambulatory Visit (INDEPENDENT_AMBULATORY_CARE_PROVIDER_SITE_OTHER): Payer: BC Managed Care – PPO | Admitting: Pulmonary Disease

## 2012-01-06 ENCOUNTER — Encounter: Payer: Self-pay | Admitting: Pulmonary Disease

## 2012-01-06 VITALS — BP 128/80 | HR 72 | Temp 97.0°F | Ht 69.0 in | Wt 172.2 lb

## 2012-01-06 DIAGNOSIS — F411 Generalized anxiety disorder: Secondary | ICD-10-CM

## 2012-01-06 DIAGNOSIS — M810 Age-related osteoporosis without current pathological fracture: Secondary | ICD-10-CM

## 2012-01-06 DIAGNOSIS — J309 Allergic rhinitis, unspecified: Secondary | ICD-10-CM

## 2012-01-06 DIAGNOSIS — E7800 Pure hypercholesterolemia, unspecified: Secondary | ICD-10-CM

## 2012-01-06 DIAGNOSIS — R0609 Other forms of dyspnea: Secondary | ICD-10-CM

## 2012-01-06 DIAGNOSIS — R0600 Dyspnea, unspecified: Secondary | ICD-10-CM

## 2012-01-06 DIAGNOSIS — D136 Benign neoplasm of pancreas: Secondary | ICD-10-CM

## 2012-01-06 DIAGNOSIS — E119 Type 2 diabetes mellitus without complications: Secondary | ICD-10-CM

## 2012-01-06 DIAGNOSIS — I10 Essential (primary) hypertension: Secondary | ICD-10-CM

## 2012-01-06 DIAGNOSIS — C50919 Malignant neoplasm of unspecified site of unspecified female breast: Secondary | ICD-10-CM

## 2012-01-06 DIAGNOSIS — J45909 Unspecified asthma, uncomplicated: Secondary | ICD-10-CM

## 2012-01-06 MED ORDER — FLUTICASONE-SALMETEROL 250-50 MCG/DOSE IN AEPB: 1.0000 | INHALATION_SPRAY | Freq: Two times a day (BID) | RESPIRATORY_TRACT | Status: DC

## 2012-01-06 MED ORDER — METHYLPREDNISOLONE ACETATE 80 MG/ML IJ SUSP
80.0000 mg | Freq: Once | INTRAMUSCULAR | Status: AC
  Administered 2012-01-06: 80 mg via INTRAMUSCULAR

## 2012-01-06 MED ORDER — PREDNISONE (PAK) 5 MG PO TABS: ORAL_TABLET | ORAL | Status: DC

## 2012-01-06 NOTE — Patient Instructions (Addendum)
Today we updated your med list in our EPIC system...    Continue your current medications the same...    We refilled the meds you requested...  We decided to treat your acute asthma exacerbation w/ a brief round of Prednisone> take as directed on the dosepak...    And we are adding ADVAIR 250/50- one inhalation twice daily...  Call for any questions...  Let's plan a regular follow up in 6 months, sooner if needed for any problems.Marland KitchenMarland Kitchen

## 2012-01-06 NOTE — Progress Notes (Signed)
Subjective:    Patient ID: Amber Washington, female    DOB: [birth year], 56 y.o.   MRN: [id number]  HPI 56 y/o WF here for a follow up visit... she has multiple medical problems as noted below...   ~  Oct09:  she has had a remarkably good year- no signif medical issues & doing well without new complaints or concerns... she would like to get her meds refilled today... she can't take the flu shots due to an allergy yrs ago to egg whites...  ~  November 10,2010:  she reports another good yr... she saw DrKraus for sporadic intense ear pain & he couldn't find anything wrong- did CT & this is reported neg as well... ?related to fillings/ dental? and she has appt soon to check this...   ~  July 09, 2010:  she continues to do well- no new complaints or concerns... she had labs earlier this yr- all looked good & BS was 120-140 w/ A1c= 7.1 to 7.4 on her Metformin & Januvia... she saw DrHecker 9/11- no retinopathy... breathing is good w/o exac & hasn't needed her Proair... she sees DrLomax for GYN & he follows her BMD as well.  ~  July 08, 2011:  Yearly ROV & her CC= IBS-like symptoms w/ mild cramping & irreg BMs w/ gas etc (she has seen DrMedoff in the past);  otherw she reports breathing is good w/o asthma exac, BP controlled on diet rx alone, FLP looks good on Lip10, but BS/A1c are up w/ her 10# wt gain & we decided to add Glimep1mg /d (see below)... She is allergic to flu vaccine; see prob list below:  ~  January 06, 2012:  92mo ROV & Amber Washington has noted incr asthma symptoms this spring- watery eyes, sneezing, congestion, SOB, "tight",sl wheezy, etc; taking her Singulair daily & using the Proair daily;  PFTs below w/ mild to mod airflow obstruction & we discusse4d need for controller med> Depo80/ Dosepak/ SYMBICORT250Bid...  otherw reports stable & we reviewed meds below> on all3 DM meds w/ FBS=126, A1c=6.7 Improved...  See prob list>> PFTs 4/13:  FVC=3.27 (85%), FEV1=2.14 (70%), %1sec=65, mid-flows= 40%  predicted... LABS 4/13:  BS=126, A1c=6.7, other chems= ok...   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  ALLERGIC RHINITIS (ICD-477.9) - life-long hx of allergic rhinitis and asthma, with eczema as well... allergy testing in 80's w/ markedly pos reactions to trees, grasses, molds, dust, dogs, cats, feathers, shellfish, eggs & other foods including wheat, spinach, beans... ? if IgE / Rast checked- we don't have Vol I of her old chart avail... prev nasal polyp surgery in 1980's by DrKraus... ~  4/13: she notes incr allergy symptoms this spring> on Singulair daily & advised OTC Antihist, Saline nasal mist, etc...  ASTHMA (ICD-493.90) - hx of asthma and allergies well controlled on SINGULAIR 10mg Qod & PROAIR HFA Prn... very active, exercising some & no problems w/ breathing... ~  11/10: f/u CXR clear... we discussed trying to wean the Singulair slowly... ~  11/11: states she's using the Singulair Qod & hasn't needed the Proair rescue. ~  4/13:  She notes incr Asthma symptoms w/ tightness, congestion, some wheezing; Rx w/ Depo/ Dosepak & SYMBICORT250-Bid... ~  PFTs 4/13:  FVC=3.27 (85%), FEV1=2.14 (70%), %1sec=65, mid-flows= 40% predicted...  HYPERTENSION, BORDERLINE (ICD-401.9) - on diet alone... ~  11/11:  BP= 122/84 (wt=160#) & she denies HA, fatigue, visual changes, CP, palipit, dizziness, syncope, dyspnea, edema, etc...  ~  10/12:  BP= 130/86 (wt=169#) & she remains asymptomatic... ~  4/13:  BP= 128/80 (wt=172#) & she denies HA, CP, palpit, edema, etc...  HYPERCHOLESTEROLEMIA (ICD-272.0) - on LIPITOR 10mg  daily... takes med regularly & tolerates well...  ~  FLP 9/08 showed TChol 132, TG 52, HDL 50, LDL 72... rec- continue same med. ~  FLP 10/09 showed TChol 137, TG 71, HDL 46, LDL 76... rec- continue same. 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 on METFORMIN ER 500mg - 2Tabs/d,  & JANUVIA 100mg /d... BS at home are 100-120 range... NOTE: she had SPLENECTOMY at the same time. ~  labs 9/08 showed BS= 125,  HgA1c= 6.6.Marland KitchenMarland Kitchen rec- same meds + diet. ~  Ophthalmology= DrHecker 9/09 no diabetic retinopathy... ~  labs 10/09 showed BS= 163,  HgA1c= 6.8.Marland Kitchen. not as good w/ weight gain, rec- same med + diet. ~  labs 11/10 showed BS= 97, A1c= 7.0.Marland KitchenMarland Kitchen reminder to avoid sweets etc... ~  labs in 2011 showed BS= 120-140, A1c= 7.1-7.4 on MetformER500-2Qam + Januv100/d. ~  9/12:  Neg Ophthalmology eval by DrHecker, no retinopathy... ~  Labs 10/12 showed BS= 156, A1c= 7.6... We decided to add GLIMEPIRIDE 1mg Qam + diet & get wt back down. ~  Labs 4/13 on all showed FBS=126, A1c=6.7.Marland KitchenMarland Kitchen Improved, continue same...  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  IBS & COLON POLYPS >> followed by [facility] but we don't have notes from him... ~  Colonoscopy 8/10 showed one 8mm sessile polyp in asc colon> bx= polypoid colonic mucosa w/ benign lymphoid aggregates...  Hx of ADENOCARCINOMA, BREAST (ICD-174.9) - right breast cancer diagnosed in 2000 w/ stage IIA disease... right lumpectomy & node bx by DrSreck, XRT by DrGoodchild, & CMF chemotherapy by DrSherrill... no known recurrence in f/u and "released" by DrSherrill. ~  f/u [facility] 6/10 at [facility] was neg...  OSTEOPOROSIS (ICD-733.00) - prev on Actonel 35mg /wk, calcium, vitamins, Vit D 2000 u daily (off Actonel now per DrLomax)... ~  BMD by DrLomax 2007 showed TScores -2.1 in Spine, & -1.7 in [facility]... ~  BMD by DrLomax in 2009- we don't have  these results... ~  BMD by DrLomax 7/11 showed TScores -2.0 Spine, & -2.2 FemNeck  ANXIETY (ICD-300.00) - on ALPRAZOLAM 0.5mg  Prn...  Health Maintenance - takes ASA 81mg /d... GYN= DrLomax for PAP, Mammograms [facility]), and BMD's... had PNEUMOVAX 2000... can't get flu shots w/ allergy to eggs...   Past Surgical History  Procedure Date  . Surgery on neck for removal of birthmark   . Right breast cancer sugery 1995  . Distal pancreatectomy and splenectomy for mucinous cystadenoma of pancreas 2000    Outpatient Encounter Prescriptions as of 01/06/2012  Medication Sig Dispense Refill  . albuterol (PROVENTIL HFA;VENTOLIN HFA) 108 (90 BASE) MCG/ACT inhaler Inhale 2 puffs into the lungs every 6 (six) hours as needed.  3 Inhaler  3  . ALPRAZolam (XANAX) 0.5 MG tablet Take 1 tablet (0.5 mg total) by mouth 3 (three)  times daily as needed for sleep or anxiety.  90 tablet  5  . aspirin 81 MG tablet Take 81 mg by mouth daily.        Marland Kitchen atorvastatin (LIPITOR) 10 MG tablet Take 1 tablet by mouth once a day  90 tablet  3  . Calcium Carbonate-Vitamin D (CALCIUM 600+D) 600-400 MG-UNIT per tablet Take 1 tablet by mouth daily. Per Dr. Nicholas Lose      . Cholecalciferol (VITAMIN D) 2000 UNITS CAPS Take 1 capsule by mouth daily.        Marland Kitchen glimepiride (AMARYL) 1 MG tablet Take 1 tablet (1 mg total) by mouth daily before breakfast.  90 tablet  3  . metFORMIN (GLUCOPHAGE-XR) 500 MG 24 hr tablet Take 2 tablets (1,000 mg total) by mouth daily with breakfast.  60 tablet  5  . montelukast (SINGULAIR) 10 MG tablet Take 1 tablet (10 mg total) by mouth at bedtime.  90 tablet  3  . Multiple Vitamins-Minerals (MULTIVITAMIN & MINERAL PO) Take 1 tablet by mouth daily.        . sitaGLIPtin (JANUVIA) 100 MG tablet Take 1 tablet (100 mg total) by mouth daily.  90 tablet  3  . dicyclomine (BENTYL) 20 MG tablet Take 1 tablet (20 mg total) by mouth 3 (three) times daily as needed.  180 tablet  3    Allergies  Allergen Reactions    . Other     Flu vaccine due to egg whites that she was allergic to as a child    Current Medications, Allergies, Past Medical History, Past Surgical History, Family History, and Social History were reviewed in [employer] record.    Review of Systems        The patient complains of dyspnea on exertion.  The patient denies fever, chills, sweats, anorexia, fatigue, weakness, malaise, weight loss, sleep disorder, blurring, diplopia, eye irritation, eye discharge, vision loss, eye pain, photophobia, earache, ear discharge, tinnitus, decreased hearing, nasal congestion, nosebleeds, sore throat, hoarseness, chest pain, palpitations, syncope, orthopnea, PND, peripheral edema, cough, dyspnea at rest, excessive sputum, hemoptysis, wheezing, pleurisy, nausea, vomiting, diarrhea, constipation, change in bowel habits, abdominal pain, melena, hematochezia, jaundice, gas/bloating, indigestion/heartburn, dysphagia, odynophagia, dysuria, hematuria, urinary frequency, urinary hesitancy, nocturia, incontinence, back pain, joint pain, joint swelling, muscle cramps, muscle weakness, stiffness, arthritis, sciatica, restless legs, leg pain at night, leg pain with exertion, rash, itching, dryness, suspicious lesions, paralysis, paresthesias, seizures, tremors, vertigo, transient blindness, frequent falls, frequent headaches, difficulty walking, depression, anxiety, memory loss, confusion, cold intolerance, heat intolerance, polydipsia, polyphagia, polyuria, unusual weight change, abnormal bruising, bleeding, enlarged lymph nodes, urticaria, allergic rash, hay fever, and recurrent infections.   Objective:   Physical Exam     WD, WN, 56 y/o WF in NAD... GENERAL:  Alert & oriented; pleasant & cooperative... HEENT:  [place]/AT, EOM-wnl, PERRLA, EACs-clear, TMs-wnl, NOSE-clear, THROAT-clear & wnl. NECK:  Supple w/ full ROM; no JVD; normal carotid impulses w/o bruits; no thyromegaly or nodules palpated; no  lymphadenopathy. Scar on neck from remote birthmark removal... CHEST:  Clear to P & A; without wheezes/ rales/ or rhonchi. Right breast scar from lumpectomy... HEART:  Regular Rhythm; without murmurs/ rubs/ or gallops. ABDOMEN:  Scar of surg, soft & nontender; normal bowel sounds; no organomegaly or masses detected. EXT: without deformities or arthritic changes; no varicose veins/ venous insuffic/ or edema. NEURO:  CN's intact; motor testing normal; sensory testing normal; gait normal & balance OK. DERM:  No lesions noted; no rash etc..Marland Kitchen  RADIOLOGY DATA:  Reviewed in the EPIC EMR & discussed w/ the patient...  LABORATORY DATA:  Reviewed in the EPIC EMR & discussed w/ the patient...   Assessment & Plan:   AR & ASTHMA>  Increased symptoms recently w/ PFT showing mild/Mod obstruction- rec Depo80, Dosepak, Symbicort250Bid regularly...  HBP>  Controlled on diet alone, but caution w/ wt gain; she will try harder for wt loss & monitor BP at home...  CHOL>  Stable on Lip10 + diet...  DM>  Her control is better w/ addition of GLIMEPIRIDE 1mg  Qam; she will continue all 3 meds & work on diet & wt reduction...  GI> Prob IBS & Polyps>  Followed by [facility] & up to date on colon screen...  Hx Breast Cancer>  No known recurrence, see details above, she gets yearly f/u mammograms etc...  Osteopenia>  Prev on Actonel but now drug holiday per [facility]; he does her BMDs & we reviewed these reportsw...  Other medical problems as noted...   Patient's Medications  New Prescriptions   FLUTICASONE-SALMETEROL (ADVAIR DISKUS) 250-50 MCG/DOSE AEPB    Inhale 1 puff into the lungs 2 (two) times daily.   PREDNISONE (STERAPRED UNI-PAK) 5 MG TABS    Take as directed--please give 6 day pack  Previous Medications   ALBUTEROL (PROVENTIL HFA;VENTOLIN HFA) 108 (90 BASE) MCG/ACT INHALER    Inhale 2 puffs into the lungs every 6 (six) hours as needed.   ALPRAZOLAM (XANAX) 0.5 MG TABLET    Take 1 tablet (0.5 mg total)  by mouth 3 (three) times daily as needed for sleep or anxiety.   ASPIRIN 81 MG TABLET    Take 81 mg by mouth daily.     ATORVASTATIN (LIPITOR) 10 MG TABLET    Take 1 tablet by mouth once a day   CALCIUM CARBONATE-VITAMIN D (CALCIUM 600+D) 600-400 MG-UNIT PER TABLET    Take 1 tablet by mouth daily. Per Dr. Nicholas Lose   CHOLECALCIFEROL (VITAMIN D) 2000 UNITS CAPS    Take 1 capsule by mouth daily.     DICYCLOMINE (BENTYL) 20 MG TABLET    Take 1 tablet (20 mg total) by mouth 3 (three) times daily as needed.   GLIMEPIRIDE (AMARYL) 1 MG TABLET    Take 1 tablet (1 mg total) by mouth daily before breakfast.   METFORMIN (GLUCOPHAGE-XR) 500 MG 24 HR TABLET    Take 2 tablets (1,000 mg total) by mouth daily with breakfast.   MONTELUKAST (SINGULAIR) 10 MG TABLET    Take 1 tablet (10 mg total) by mouth at bedtime.   MULTIPLE VITAMINS-MINERALS (MULTIVITAMIN & MINERAL PO)    Take 1 tablet by mouth daily.     SITAGLIPTIN (JANUVIA) 100 MG TABLET    Take 1 tablet (100 mg total) by mouth daily.  Modified Medications   No medications on file  Discontinued Medications   No medications on file

## 2012-01-29 ENCOUNTER — Other Ambulatory Visit: Payer: Self-pay | Admitting: Pulmonary Disease

## 2012-04-06 ENCOUNTER — Other Ambulatory Visit: Payer: Self-pay | Admitting: Gynecology

## 2012-04-06 DIAGNOSIS — Z1231 Encounter for screening mammogram for malignant neoplasm of breast: Secondary | ICD-10-CM

## 2012-04-14 ENCOUNTER — Ambulatory Visit
Admission: RE | Admit: 2012-04-14 | Discharge: 2012-04-14 | Disposition: A | Discharge status: Home / Self Care | Payer: BC Managed Care – PPO | Source: Ambulatory Visit | Attending: Gynecology | Admitting: Gynecology

## 2012-04-14 DIAGNOSIS — Z1231 Encounter for screening mammogram for malignant neoplasm of breast: Secondary | ICD-10-CM

## 2012-06-22 ENCOUNTER — Other Ambulatory Visit: Payer: Self-pay | Admitting: Pulmonary Disease

## 2012-06-22 ENCOUNTER — Telehealth: Payer: Self-pay | Admitting: Pulmonary Disease

## 2012-06-22 DIAGNOSIS — E119 Type 2 diabetes mellitus without complications: Secondary | ICD-10-CM

## 2012-06-22 DIAGNOSIS — I10 Essential (primary) hypertension: Secondary | ICD-10-CM

## 2012-06-22 DIAGNOSIS — E7800 Pure hypercholesterolemia, unspecified: Secondary | ICD-10-CM

## 2012-06-22 DIAGNOSIS — F411 Generalized anxiety disorder: Secondary | ICD-10-CM

## 2012-06-22 DIAGNOSIS — Z0000 Encounter for general adult medical examination without abnormal findings: Secondary | ICD-10-CM

## 2012-06-22 NOTE — Telephone Encounter (Signed)
Please advise SN thanks 

## 2012-06-22 NOTE — Telephone Encounter (Signed)
Per SN---ok to have the pt to come in for fasting labs prior to ov.   Called and spoke with pt and she is aware.

## 2012-06-28 ENCOUNTER — Other Ambulatory Visit (INDEPENDENT_AMBULATORY_CARE_PROVIDER_SITE_OTHER): Payer: BC Managed Care – PPO

## 2012-06-28 ENCOUNTER — Other Ambulatory Visit: Payer: Self-pay | Admitting: Pulmonary Disease

## 2012-06-28 DIAGNOSIS — Z0000 Encounter for general adult medical examination without abnormal findings: Secondary | ICD-10-CM

## 2012-06-28 DIAGNOSIS — E119 Type 2 diabetes mellitus without complications: Secondary | ICD-10-CM

## 2012-06-28 LAB — CBC WITH DIFFERENTIAL/PLATELET
Basophils Absolute: 0.1 10*3/uL (ref 0.0–0.1)
Eosinophils Relative: 4 % (ref 0.0–5.0)
Lymphs Abs: 3 10*3/uL (ref 0.7–4.0)
MCV: 89.4 fl (ref 78.0–100.0)
Monocytes Absolute: 0.8 10*3/uL (ref 0.1–1.0)
Monocytes Relative: 9.9 % (ref 3.0–12.0)
Neutrophils Relative %: 49.6 % (ref 43.0–77.0)
Platelets: 326 10*3/uL (ref 150.0–400.0)
RDW: 13.2 % (ref 11.5–14.6)
WBC: 8.4 10*3/uL (ref 4.5–10.5)

## 2012-06-28 LAB — BASIC METABOLIC PANEL
BUN: 22 mg/dL (ref 6–23)
CO2: 31 mEq/L (ref 19–32)
Chloride: 103 mEq/L (ref 96–112)
Glucose, Bld: 143 mg/dL — ABNORMAL HIGH (ref 70–99)
Potassium: 4.6 mEq/L (ref 3.5–5.1)
Sodium: 140 mEq/L (ref 135–145)

## 2012-06-28 LAB — HEMOGLOBIN A1C: Hgb A1c MFr Bld: 7 % — ABNORMAL HIGH (ref 4.6–6.5)

## 2012-06-28 LAB — MICROALBUMIN / CREATININE URINE RATIO: Microalb Creat Ratio: 3.4 mg/g (ref 0.0–30.0)

## 2012-06-28 LAB — LIPID PANEL: Cholesterol: 145 mg/dL (ref 0–200)

## 2012-06-28 LAB — HEPATIC FUNCTION PANEL
ALT: 19 U/L (ref 0–35)
AST: 18 U/L (ref 0–37)
Albumin: 3.9 g/dL (ref 3.5–5.2)
Alkaline Phosphatase: 62 U/L (ref 39–117)

## 2012-06-28 LAB — TSH: TSH: 2.21 u[IU]/mL (ref 0.35–5.50)

## 2012-07-06 ENCOUNTER — Encounter: Payer: Self-pay | Admitting: *Deleted

## 2012-07-07 ENCOUNTER — Encounter: Payer: Self-pay | Admitting: Pulmonary Disease

## 2012-07-07 ENCOUNTER — Ambulatory Visit (INDEPENDENT_AMBULATORY_CARE_PROVIDER_SITE_OTHER): Payer: BC Managed Care – PPO | Admitting: Pulmonary Disease

## 2012-07-07 VITALS — BP 122/80 | HR 76 | Temp 97.3°F | Ht 69.0 in | Wt 175.4 lb

## 2012-07-07 DIAGNOSIS — M810 Age-related osteoporosis without current pathological fracture: Secondary | ICD-10-CM

## 2012-07-07 DIAGNOSIS — E119 Type 2 diabetes mellitus without complications: Secondary | ICD-10-CM

## 2012-07-07 DIAGNOSIS — C50919 Malignant neoplasm of unspecified site of unspecified female breast: Secondary | ICD-10-CM

## 2012-07-07 DIAGNOSIS — D136 Benign neoplasm of pancreas: Secondary | ICD-10-CM

## 2012-07-07 DIAGNOSIS — I10 Essential (primary) hypertension: Secondary | ICD-10-CM

## 2012-07-07 DIAGNOSIS — F411 Generalized anxiety disorder: Secondary | ICD-10-CM

## 2012-07-07 DIAGNOSIS — J45909 Unspecified asthma, uncomplicated: Secondary | ICD-10-CM

## 2012-07-07 DIAGNOSIS — E7800 Pure hypercholesterolemia, unspecified: Secondary | ICD-10-CM

## 2012-07-07 DIAGNOSIS — J309 Allergic rhinitis, unspecified: Secondary | ICD-10-CM

## 2012-07-07 DIAGNOSIS — Z23 Encounter for immunization: Secondary | ICD-10-CM

## 2012-07-07 MED ORDER — ATORVASTATIN CALCIUM 10 MG PO TABS: ORAL_TABLET | ORAL | Status: DC

## 2012-07-07 MED ORDER — SITAGLIPTIN PHOSPHATE 100 MG PO TABS: 100.0000 mg | ORAL_TABLET | Freq: Every day | ORAL | Status: DC

## 2012-07-07 MED ORDER — MONTELUKAST SODIUM 10 MG PO TABS: 10.0000 mg | ORAL_TABLET | Freq: Every day | ORAL | Status: DC

## 2012-07-07 MED ORDER — ALPRAZOLAM 0.5 MG PO TABS: 0.5000 mg | ORAL_TABLET | Freq: Three times a day (TID) | ORAL | Status: DC | PRN

## 2012-07-07 MED ORDER — ACCU-CHEK AVIVA PLUS W/DEVICE KIT: 1.0000 | PACK | Freq: Once | Status: DC

## 2012-07-07 MED ORDER — METFORMIN HCL ER 500 MG PO TB24: ORAL_TABLET | ORAL | Status: DC

## 2012-07-07 MED ORDER — GLIMEPIRIDE 1 MG PO TABS: 1.0000 mg | ORAL_TABLET | Freq: Every day | ORAL | Status: DC

## 2012-07-07 MED ORDER — FLUTICASONE-SALMETEROL 250-50 MCG/DOSE IN AEPB: 1.0000 | INHALATION_SPRAY | Freq: Two times a day (BID) | RESPIRATORY_TRACT | Status: DC

## 2012-07-07 MED ORDER — FLUOCINONIDE-E 0.05 % EX CREA: TOPICAL_CREAM | Freq: Two times a day (BID) | CUTANEOUS | Status: DC | PRN

## 2012-07-07 MED ORDER — ALBUTEROL SULFATE HFA 108 (90 BASE) MCG/ACT IN AERS: 2.0000 | INHALATION_SPRAY | Freq: Four times a day (QID) | RESPIRATORY_TRACT | Status: DC | PRN

## 2012-07-07 NOTE — Patient Instructions (Addendum)
Today we updated your med list in our EPIC system...    Continue your current medications the same...    We refilled your meds per request...    We also wrote for a cortisone cream- LIDEX-E to use as needed for the eczema...  Today we gave you the combination Tetanus vaccine called the TDAP (it should be god for 10 yrs)...  We reviewed yoiur recent blood work 7 gave you a copy...  Call for any questions...  Let's plan a follow up visit in 6 months.Marland KitchenMarland Kitchen

## 2012-07-07 NOTE — Progress Notes (Signed)
Subjective:    Patient ID: Amber Washington, female    DOB: Dec 12, 1955, 56 y.o.   MRN: [id number]  HPI 56 y/o WF here for a follow up visit... she has multiple medical problems as noted below...   ~  July 09, 2010:  she continues to do well- no new complaints or concerns... she had labs earlier this yr- all looked good & BS was 120-140 w/ A1c= 7.1 to 7.4 on her Metformin & Januvia... she saw DrHecker 9/11- no retinopathy... breathing is good w/o exac & hasn't needed her Proair... she sees DrLomax for GYN & he follows her BMD as well.  ~  July 08, 2011:  Yearly ROV & her CC= IBS-like symptoms w/ mild cramping & irreg BMs w/ gas etc (she has seen DrMedoff in the past);  otherw she reports breathing is good w/o asthma exac, BP controlled on diet rx alone, FLP looks good on Lip10, but BS/A1c are up w/ her 10# wt gain & we decided to add Glimep1mg /d (see below)... She is allergic to flu vaccine; see prob list below:  ~  January 06, 2012:  54mo ROV & [name] has noted incr asthma symptoms this spring- watery eyes, sneezing, congestion, SOB, "tight",sl wheezy, etc; taking her Singulair daily & using the Proair daily;  PFTs below w/ mild to mod airflow obstruction & we discusse4d need for controller med> Depo80/ Dosepak/ SYMBICORT250Bid...  otherw reports stable & we reviewed meds below> on all3 DM meds w/ FBS=126, A1c=6.7 Improved...  See prob list>> PFTs 4/13:  FVC=3.27 (85%), FEV1=2.14 (70%), %1sec=65, mid-flows= 40% predicted... LABS 4/13:  BS=126, A1c=6.7, other chems= ok...  ~  July 07, 2102:  54mo ROV & [name] has had a good interval- but c/o being tired, caring for new grandbaby (daugh lives w/ her); notes flair of eczema on elbows & we discussed Rx w/ LidexE cream... We reviewed the following medical problems during today's office visit>>     AR, Asthma> on Singulair10, Advair250 (just once/d per pt), ProairHFA prn; she had resp exac 4/13 w/ Advair & Dosepak Rx> improved.    BorderlineHBP> on  ASA81; BP= 122/80 & she denies CP, palpit, SOB, edema, etc...    Chol> on Lip10; FLP showed TChol 145, TG 94, HDL 46, LDL 80    DM> on Metop500Bid, Glimep1mg , Januvia100; FBS= 143, A1c= 7.0; she is rec to get on better diet, exercise, etc...    GI- IBS, polyps> off prev Bentyl; followed by [facility]- last colon 8/10 was neg x one benign polyp, no adenomatous changes...    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    Hx breast cancer> right breast ca diagnosed 2000- surg, XRT, ChemoRx by DrSherrill; no known recurrence & she is followed by [facility] Prima now...    Osteopenia> off calcium, on VitD2000; she gets her BMDs from DrLomax, last 7/11 w/ TScore -2.2 in [facility] (she reports f/u 2013 was stable).    Anxiety> on Xanax0.5; she notes that Daugh & grandbaby have moved in w/ them... We reviewed prob list, meds, xrays and labs> see below for updates >> she refuses Flu shot, OK TDAP today...   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  ALLERGIC RHINITIS (ICD-477.9) - life-long hx of allergic rhinitis and asthma, with eczema as well... allergy testing in  80's w/ markedly pos reactions to trees, grasses, molds, dust, dogs, cats, feathers, shellfish, eggs & other foods including wheat, spinach, beans... ? if IgE / Rast checked- we don't have Vol I of her old chart avail... prev nasal polyp surgery in 1980's by DrKraus... ~  4/13: she notes incr allergy symptoms this spring> on Singulair daily & advised OTC Antihist, Saline nasal mist, etc...  ASTHMA (ICD-493.90) - hx of asthma and allergies well controlled on SINGULAIR 10mg Qod & PROAIR HFA Prn... very active, exercising some & no problems w/ breathing... ~  11/10: f/u CXR clear... we discussed trying to wean the Singulair slowly... ~  11/11: states she's using the  Singulair Qod & hasn't needed the Proair rescue. ~  4/13:  She notes incr Asthma symptoms w/ tightness, congestion, some wheezing; Rx w/ Depo/ Dosepak & ADVAIR250-Bid... ~  PFTs 4/13:  FVC=3.27 (85%), FEV1=2.14 (70%), %1sec=65, mid-flows= 40% predicted...  HYPERTENSION, BORDERLINE (ICD-401.9) - on diet alone... ~  11/11:  BP= 122/84 (wt=160#) & she denies HA, fatigue, visual changes, CP, palipit, dizziness, syncope, dyspnea, edema, etc...  ~  10/12:  BP= 130/86 (wt=169#) & she remains asymptomatic... ~  4/13:  BP= 128/80 (wt=172#) & she denies HA, CP, palpit, edema, etc... ~  10/13: on ASA81 + diet; BP= 122/80 & she denies CP, palpit, SOB, edema, etc...  HYPERCHOLESTEROLEMIA (ICD-272.0) - on LIPITOR 10mg  daily... takes med regularly & tolerates well...  ~  FLP 9/08 showed TChol 132, TG 52, HDL 50, LDL 72... rec- continue same med. ~  FLP 10/09 showed TChol 137, TG 71, HDL 46, LDL 76... rec- continue same. ~  FLP 11/10 showed Tchol 145, TG 84, HDL 49, LDL 79 ~  FLP 5/11 on Lip10 showed TChol 153, TG 52, HDL 52, LDL 91 ~  FLP 10/12 on Lip10 showed TChol 153, TG 76, HDL 53, LDL 85 ~  FLP 10/13 on Lip10 showed TChol 145, TG 94, HDL 46, LDL 80  DIABETES MELLITUS (ICD-250.00) - started after her partial pancreatectomy in 2000... on METFORMIN ER 500mg - 2Tabs/d,  & JANUVIA 100mg /d... BS at home are 100-120 range... NOTE: she had SPLENECTOMY at the same time. ~  labs 9/08 showed BS= 125,  HgA1c= 6.6.Marland KitchenMarland Kitchen rec- same meds + diet. ~  Ophthalmology= DrHecker 9/09 no diabetic retinopathy... ~  labs 10/09 showed BS= 163,  HgA1c= 6.8.Marland Kitchen. not as good w/ weight gain, rec- same med + diet. ~  labs 11/10 showed BS= 97, A1c= 7.0.Marland KitchenMarland Kitchen reminder to avoid sweets etc... ~  labs in 2011 showed BS= 120-140, A1c= 7.1-7.4 on MetformER500-2Qam + Januv100/d. ~  9/12:  Neg Ophthalmology eval by DrHecker, no retinopathy... ~  Labs 10/12 showed BS= 156, A1c= 7.6... We decided to add GLIMEPIRIDE 1mg Qam + diet & get wt back down. ~   Labs 4/13 on all showed FBS=126, A1c=6.7.Marland KitchenMarland Kitchen Improved, continue same... ~  Labs 10/13 on on Metop500Bid, Glimep1mg , Januvia100; FBS= 143, A1c= 7.0; needs better diet, same meds for now.  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  IBS & COLON POLYPS >> followed by [facility] but we don't have notes from him... ~  Colonoscopy 8/10 showed one 8mm sessile polyp in asc colon> bx= polypoid colonic mucosa w/ benign lymphoid aggregates...  Hx of ADENOCARCINOMA, BREAST (ICD-174.9) - right breast cancer diagnosed in 2000 w/ stage IIA disease... right lumpectomy & node bx  by DrSreck, XRT by DrGoodchild, & CMF chemotherapy by DrSherrill... no known recurrence in f/u and "released" by DrSherrill. ~  f/u Mammogram 6/10 at [facility] was neg...  OSTEOPOROSIS (ICD-733.00) - prev on Actonel 35mg /wk, calcium, vitamins, Vit D 2000 u daily (off Actonel now per DrLomax)... ~  BMD by DrLomax 2007 showed TScores -2.1 in Spine, & -1.7 in [facility]... ~  BMD by DrLomax in 2009- we don't have these results... ~  BMD by DrLomax 7/11 showed TScores -2.0 Spine, & -2.2 FemNeck ~  BMD 2013> we don't have copy but pt reports that it was stable...  ANXIETY (ICD-300.00) - on ALPRAZOLAM 0.5mg  Prn...  Health Maintenance - takes ASA 81mg /d... GYN= DrLomax for PAP, Mammograms [facility]), and BMD's... had PNEUMOVAX 2000... can't get flu shots w/ allergy to eggs...   Past Surgical History  Procedure Date  . Surgery on neck for removal of birthmark   . Right breast cancer sugery 1995  . Distal pancreatectomy and splenectomy for mucinous cystadenoma of pancreas 2000    Outpatient Encounter Prescriptions as of 07/07/2012  Medication Sig Dispense Refill  . albuterol (PROVENTIL HFA;VENTOLIN HFA) 108 (90 BASE) MCG/ACT inhaler Inhale 2 puffs into the lungs every 6 (six)  hours as needed.  3 Inhaler  3  . ALPRAZolam (XANAX) 0.5 MG tablet TAKE ONE TABLET THREE TIMES DAILY AS NEEDED FOR SLEEP OR ANXIETY  90 tablet  5  . aspirin 81 MG tablet Take 81 mg by mouth daily.        Marland Kitchen atorvastatin (LIPITOR) 10 MG tablet Take 1 tablet by mouth once a day  90 tablet  3  . Cholecalciferol (VITAMIN D) 2000 UNITS CAPS Take 1 capsule by mouth daily.        . Fluticasone-Salmeterol (ADVAIR DISKUS) 250-50 MCG/DOSE AEPB Inhale 1 puff into the lungs 2 (two) times daily.  60 each  5  . glimepiride (AMARYL) 1 MG tablet Take 1 tablet (1 mg total) by mouth daily before breakfast.  90 tablet  3  . metFORMIN (GLUCOPHAGE-XR) 500 MG 24 hr tablet       . montelukast (SINGULAIR) 10 MG tablet Take 1 tablet (10 mg total) by mouth at bedtime.  90 tablet  3  . sitaGLIPtin (JANUVIA) 100 MG tablet Take 1 tablet (100 mg total) by mouth daily.  90 tablet  3  . DISCONTD: metFORMIN (GLUCOPHAGE-XR) 500 MG 24 hr tablet TAKE TWO TABLETS BY MOUTH EVERY DAY WITH BREAKFAST  60 tablet  5  . DISCONTD: Calcium Carbonate-Vitamin D (CALCIUM 600+D) 600-400 MG-UNIT per tablet Take 1 tablet by mouth daily. Per Dr. Nicholas Lose      . DISCONTD: dicyclomine (BENTYL) 20 MG tablet Take 1 tablet (20 mg total) by mouth 3 (three) times daily as needed.  180 tablet  3  . DISCONTD: Multiple Vitamins-Minerals (MULTIVITAMIN & MINERAL PO) Take 1 tablet by mouth daily.        Marland Kitchen DISCONTD: predniSONE (STERAPRED UNI-PAK) 5 MG TABS Take as directed--please give 6 day pack  21 each  0    Allergies  Allergen Reactions  . Other     Flu vaccine due to egg whites that she was allergic to as a child    Current Medications, Allergies, Past Medical History, Past Surgical History, Family History, and Social History were reviewed in [employer] record.    Review of Systems        The patient complains of dyspnea on exertion.  The patient denies fever,  chills, sweats, anorexia, fatigue, weakness, malaise, weight loss,  sleep disorder, blurring, diplopia, eye irritation, eye discharge, vision loss, eye pain, photophobia, earache, ear discharge, tinnitus, decreased hearing, nasal congestion, nosebleeds, sore throat, hoarseness, chest pain, palpitations, syncope, orthopnea, PND, peripheral edema, cough, dyspnea at rest, excessive sputum, hemoptysis, wheezing, pleurisy, nausea, vomiting, diarrhea, constipation, change in bowel habits, abdominal pain, melena, hematochezia, jaundice, gas/bloating, indigestion/heartburn, dysphagia, odynophagia, dysuria, hematuria, urinary frequency, urinary hesitancy, nocturia, incontinence, back pain, joint pain, joint swelling, muscle cramps, muscle weakness, stiffness, arthritis, sciatica, restless legs, leg pain at night, leg pain with exertion, rash, itching, dryness, suspicious lesions, paralysis, paresthesias, seizures, tremors, vertigo, transient blindness, frequent falls, frequent headaches, difficulty walking, depression, anxiety, memory loss, confusion, cold intolerance, heat intolerance, polydipsia, polyphagia, polyuria, unusual weight change, abnormal bruising, bleeding, enlarged lymph nodes, urticaria, allergic rash, hay fever, and recurrent infections.   Objective:   Physical Exam     WD, WN, 56 y/o WF in NAD... GENERAL:  Alert & oriented; pleasant & cooperative... HEENT:  [place]/AT, EOM-wnl, PERRLA, EACs-clear, TMs-wnl, NOSE-clear, THROAT-clear & wnl. NECK:  Supple w/ full ROM; no JVD; normal carotid impulses w/o bruits; no thyromegaly or nodules palpated; no lymphadenopathy. Scar on neck from remote birthmark removal... CHEST:  Clear to P & A; without wheezes/ rales/ or rhonchi. Right breast scar from lumpectomy... HEART:  Regular Rhythm; without murmurs/ rubs/ or gallops. ABDOMEN:  Scar of surg, soft & nontender; normal bowel sounds; no organomegaly or masses detected. EXT: without deformities or arthritic changes; no varicose veins/ venous insuffic/ or edema. NEURO:  CN's  intact; motor testing normal; sensory testing normal; gait normal & balance OK. DERM:  No lesions noted; no rash etc...  RADIOLOGY DATA:  Reviewed in the EPIC EMR & discussed w/ the patient...  LABORATORY DATA:  Reviewed in the EPIC EMR & discussed w/ the patient...   Assessment & Plan:    AR & ASTHMA>  Increased symptoms recently w/ PFT showing mild/Mod obstruction- rec Depo80, Dosepak, Symbicort250Bid regularly...  HBP>  Controlled on diet alone, but caution w/ wt gain; she will try harder for wt loss & monitor BP at home...  CHOL>  Stable on Lip10 + diet...  DM>  Her control is better w/ addition of GLIMEPIRIDE 1mg  Qam; she will continue all 3 meds & work on diet & wt reduction...  GI> Prob IBS & Polyps>  Followed by [facility] & up to date on colon screen...  Hx Breast Cancer>  No known recurrence, see details above, she gets yearly f/u mammograms etc...  Osteopenia>  Prev on Actonel but now drug holiday per [facility]; he does her BMDs & we reviewed these reportsw...  Other medical problems as noted...   Patient's Medications  New Prescriptions   BD ULTRA-FINE LANCETS LANCETS    Use as instructed   BLOOD GLUCOSE MONITORING SUPPL (ACCU-CHEK AVIVA PLUS) W/DEVICE KIT    1 kit by Does not apply route once.   FLUOCINONIDE-EMOLLIENT (LIDEX-E) 0.05 % CREAM    Apply topically 2 (two) times daily as needed.  Previous Medications   ASPIRIN 81 MG TABLET    Take 81 mg by mouth daily.     CHOLECALCIFEROL (VITAMIN D) 2000 UNITS CAPS    Take 1 capsule by mouth daily.    Modified Medications   Modified Medication Previous Medication   ALBUTEROL (PROVENTIL HFA;VENTOLIN HFA) 108 (90 BASE) MCG/ACT INHALER albuterol (PROVENTIL HFA;VENTOLIN HFA) 108 (90 BASE) MCG/ACT inhaler      Inhale 2 puffs into the lungs every  6 (six) hours as needed.    Inhale 2 puffs into the lungs every 6 (six) hours as needed.   ALPRAZOLAM (XANAX) 0.5 MG TABLET ALPRAZolam (XANAX) 0.5 MG tablet      Take 1 tablet (0.5 mg  total) by mouth 3 (three) times daily as needed for sleep.    TAKE ONE TABLET THREE TIMES DAILY AS NEEDED FOR SLEEP OR ANXIETY   ATORVASTATIN (LIPITOR) 10 MG TABLET atorvastatin (LIPITOR) 10 MG tablet      Take 1 tablet by mouth once a day    Take 1 tablet by mouth once a day   FLUTICASONE-SALMETEROL (ADVAIR DISKUS) 250-50 MCG/DOSE AEPB Fluticasone-Salmeterol (ADVAIR DISKUS) 250-50 MCG/DOSE AEPB      Inhale 1 puff into the lungs 2 (two) times daily.    Inhale 1 puff into the lungs 2 (two) times daily.   GLIMEPIRIDE (AMARYL) 1 MG TABLET glimepiride (AMARYL) 1 MG tablet      Take 1 tablet (1 mg total) by mouth daily before breakfast.    Take 1 tablet (1 mg total) by mouth daily before breakfast.   GLUCOSE BLOOD (ACCU-CHEK AVIVA PLUS) TEST STRIP glucose blood (ACCU-CHEK AVIVA PLUS) test strip      TEST ONCE DAILY,  DX  250.00    Use as instructed   METFORMIN (GLUCOPHAGE-XR) 500 MG 24 HR TABLET metFORMIN (GLUCOPHAGE-XR) 500 MG 24 hr tablet      Take one tablet by mouth two times daily    TAKE TWO TABLETS BY MOUTH EVERY DAY WITH BREAKFAST   MONTELUKAST (SINGULAIR) 10 MG TABLET montelukast (SINGULAIR) 10 MG tablet      Take 1 tablet (10 mg total) by mouth at bedtime.    Take 1 tablet (10 mg total) by mouth at bedtime.   SITAGLIPTIN (JANUVIA) 100 MG TABLET sitaGLIPtin (JANUVIA) 100 MG tablet      Take 1 tablet (100 mg total) by mouth daily.    Take 1 tablet (100 mg total) by mouth daily.  Discontinued Medications   CALCIUM CARBONATE-VITAMIN D (CALCIUM 600+D) 600-400 MG-UNIT PER TABLET    Take 1 tablet by mouth daily. Per Dr. Nicholas Lose   DICYCLOMINE (BENTYL) 20 MG TABLET    Take 1 tablet (20 mg total) by mouth 3 (three) times daily as needed.   METFORMIN (GLUCOPHAGE-XR) 500 MG 24 HR TABLET       MULTIPLE VITAMINS-MINERALS (MULTIVITAMIN & MINERAL PO)    Take 1 tablet by mouth daily.     PREDNISONE (STERAPRED UNI-PAK) 5 MG TABS    Take as directed--please give 6 day pack

## 2012-07-14 ENCOUNTER — Other Ambulatory Visit: Payer: Self-pay | Admitting: *Deleted

## 2012-07-14 MED ORDER — BD ULTRA-FINE LANCETS MISC: Status: DC

## 2012-07-14 MED ORDER — GLUCOSE BLOOD VI STRP: ORAL_STRIP | Status: DC

## 2012-07-16 ENCOUNTER — Other Ambulatory Visit: Payer: Self-pay | Admitting: *Deleted

## 2012-07-16 MED ORDER — GLUCOSE BLOOD VI STRP: ORAL_STRIP | Status: DC

## 2012-08-30 ENCOUNTER — Other Ambulatory Visit: Payer: Self-pay | Admitting: Pulmonary Disease

## 2012-09-06 ENCOUNTER — Emergency Department (HOSPITAL_COMMUNITY)
Admission: EM | Admit: 2012-09-06 | Discharge: 2012-09-06 | Disposition: A | Discharge status: Home / Self Care | Payer: BC Managed Care – PPO | Source: Home / Self Care | Attending: Emergency Medicine | Admitting: Emergency Medicine

## 2012-09-06 ENCOUNTER — Encounter (HOSPITAL_COMMUNITY): Payer: Self-pay | Admitting: Emergency Medicine

## 2012-09-06 DIAGNOSIS — S61419A Laceration without foreign body of unspecified hand, initial encounter: Secondary | ICD-10-CM

## 2012-09-06 DIAGNOSIS — S61409A Unspecified open wound of unspecified hand, initial encounter: Secondary | ICD-10-CM

## 2012-09-06 MED ORDER — HYDROCODONE-ACETAMINOPHEN 5-500 MG PO TABS: 1.0000 | ORAL_TABLET | Freq: Four times a day (QID) | ORAL | Status: DC | PRN

## 2012-09-06 NOTE — ED Notes (Signed)
Called to evaluate this pt's hand lac. Pt has approx 1 inch lac to LEFT palm. States she cut it w/ a new sharp kitchen knife. Pt has +PMS to all digits this hand. Lac covered w/ 4"x4" gauze pads, wrapped w/ 2" coban. All w/o difficulty. VS assessed at this time as well. Pt instructed to wait in waiting area and to inform staff if anything changed in her present condition/complaint priro to being called to the exam room/treatment area. Pt & her husband verbalized their understanding of this instruction.

## 2012-09-06 NOTE — ED Notes (Signed)
Pt is here for a laceration to left palm that occurred around 17:30 while using a new knife.... Laceration is straight and about 2.5 cm... She is alert w/no signs of acute distress.

## 2012-09-06 NOTE — ED Provider Notes (Signed)
History     CSN: [id number]  Arrival date & time 09/06/12  4098   First MD Initiated Contact with Patient 09/06/12 1853      Chief Complaint  Patient presents with  . Laceration    (Consider location/radiation/quality/duration/timing/severity/associated sxs/prior treatment) HPI Comments: Patient was cutting a avocado where she accidentally cut her left palm. Was bleeding until a few minutes ago when she started putting pressure on the wound. Denies any numbness or tingling and has no difficulty extending or flexing the fifth or fourth digits.  Patient is a 56 y.o. female presenting with skin laceration. The history is provided by the patient.  Laceration  The incident occurred 1 to 2 hours ago. The laceration is 5 cm in size. The laceration mechanism was a a clean knife. The pain is at a severity of 5/10. The pain is moderate. The pain has been constant since onset. She reports no foreign bodies present.    Past Medical History  Diagnosis Date  . Stenosis of nasolacrimal duct, acquired   . Allergic rhinitis   . Asthma   . Borderline hypertension   . Hypercholesteremia   . Type II or unspecified type diabetes mellitus without mention of complication, not stated as uncontrolled   . Benign neoplasm of pancreas, except islets of Langerhans   . Malignant neoplasm of breast (female), unspecified site   . Osteoporosis, unspecified   . Anxiety state, unspecified     Past Surgical History  Procedure Date  . Surgery on neck for removal of birthmark   . Right breast cancer sugery 1995  . Distal pancreatectomy and splenectomy for mucinous cystadenoma of pancreas 2000    No family history on file.  History  Substance Use Topics  . Smoking status: Never Smoker   . Smokeless tobacco: Not on file  . Alcohol Use: Yes     Comment: social use    OB History    Grav Para Term Preterm Abortions TAB SAB Ect Mult Living                  Review of Systems  Constitutional: Negative  for chills, activity change and appetite change.  Skin: Positive for wound.  Neurological: Negative for weakness and numbness.    Allergies  Other  Home Medications   Current Outpatient Rx  Name  Route  Sig  Dispense  Refill  . ALPRAZOLAM 0.5 MG PO TABS      TAKE ONE TABLET BY MOUTH THREE TIMES DAILY AS NEEDED FOR SLEEP OR ANXIETY   90 tablet   5   . ATORVASTATIN CALCIUM 10 MG PO TABS      Take 1 tablet by mouth once a day   90 tablet   3   . GLIMEPIRIDE 1 MG PO TABS   Oral   Take 1 tablet (1 mg total) by mouth daily before breakfast.   90 tablet   3   . METFORMIN HCL ER 500 MG PO TB24      Take one tablet by mouth two times daily   180 tablet   3   . MONTELUKAST SODIUM 10 MG PO TABS   Oral   Take 1 tablet (10 mg total) by mouth at bedtime.   90 tablet   3   . SITAGLIPTIN PHOSPHATE 100 MG PO TABS   Oral   Take 1 tablet (100 mg total) by mouth daily.   90 tablet   3   . ALBUTEROL SULFATE HFA 108 (  90 BASE) MCG/ACT IN AERS   Inhalation   Inhale 2 puffs into the lungs every 6 (six) hours as needed.   1 Inhaler   6   . ASPIRIN 81 MG PO TABS   Oral   Take 81 mg by mouth daily.           . BD ULTRA-FINE LANCETS MISC      Use as instructed   100 each   12   . ACCU-CHEK AVIVA PLUS W/DEVICE KIT   Does not apply   1 kit by Does not apply route once.   1 kit   0     Test blood sugar as directed   . VITAMIN D 2000 UNITS PO CAPS   Oral   Take 1 capsule by mouth daily.           Marland Kitchen FLUOCINONIDE-E 0.05 % EX CREA   Topical   Apply topically 2 (two) times daily as needed.   30 g   5   . FLUTICASONE-SALMETEROL 250-50 MCG/DOSE IN AEPB   Inhalation   Inhale 1 puff into the lungs 2 (two) times daily.   3 each   3   . GLUCOSE BLOOD VI STRP      TEST ONCE DAILY,  DX  250.00   100 each   12   . HYDROCODONE-ACETAMINOPHEN 5-500 MG PO TABS   Oral   Take 1 tablet by mouth every 6 (six) hours as needed for pain.   15 tablet   0     BP 143/96   Pulse 76  Temp 98.1 F (36.7 C) (Oral)  Resp 18  SpO2 100%  Physical Exam  Vitals reviewed. Constitutional: She appears well-developed and well-nourished.  Musculoskeletal: She exhibits tenderness.       Hands: Neurological: She is alert.    ED Course  LACERATION REPAIR Performed by: Lynea Rollison Authorized by: Jimmie Molly Consent: Verbal consent obtained. Consent given by: patient Patient understanding: patient states understanding of the procedure being performed Required items: required blood products, implants, devices, and special equipment available Patient identity confirmed: verbally with patient Body area: upper extremity Location details: left hand Contamination: The wound is contaminated. Tendon involvement: none Nerve involvement: none Local anesthetic: lidocaine 1% with epinephrine Anesthetic total: 5 ml Irrigation solution: saline Skin closure: 4-0 nylon Technique: simple Approximation: close Approximation difficulty: simple Dressing: 4x4 sterile gauze Patient tolerance: Patient tolerated the procedure well with no immediate complications.   (including critical care time)  Labs Reviewed - No data to display No results found.   1. Laceration of hand       MDM   Uncomplicated- 5 cm laceration to the palmar surface of left hand. No tendon involvement no neural deficits. Sutured with nonabsorbable sutures otherwise to return 10 days for suture removal or sooner if any signs of infection. Patient instructed to use topical antibiotics.       Jimmie Molly, MD 09/06/12 [phone]

## 2012-12-21 ENCOUNTER — Telehealth: Payer: Self-pay | Admitting: Pulmonary Disease

## 2012-12-21 DIAGNOSIS — Z0000 Encounter for general adult medical examination without abnormal findings: Secondary | ICD-10-CM

## 2012-12-21 NOTE — Telephone Encounter (Signed)
Per SN---ok to come in for fasting labs.  i called and spoke with the pt and she is aware of orders placed in the computer for her. Nothing further is needed.

## 2012-12-21 NOTE — Telephone Encounter (Signed)
Last OV 07/07/12 Pending 01/07/13 for 6 month f/u Please advise what labs pt will need SN thanks

## 2012-12-23 ENCOUNTER — Other Ambulatory Visit (INDEPENDENT_AMBULATORY_CARE_PROVIDER_SITE_OTHER): Payer: BC Managed Care – PPO

## 2012-12-23 DIAGNOSIS — Z0000 Encounter for general adult medical examination without abnormal findings: Secondary | ICD-10-CM

## 2012-12-23 LAB — HEPATIC FUNCTION PANEL
AST: 20 U/L (ref 0–37)
Albumin: 4.2 g/dL (ref 3.5–5.2)
Alkaline Phosphatase: 64 U/L (ref 39–117)
Total Bilirubin: 0.7 mg/dL (ref 0.3–1.2)

## 2012-12-23 LAB — CBC WITH DIFFERENTIAL/PLATELET
Eosinophils Relative: 6.2 % — ABNORMAL HIGH (ref 0.0–5.0)
HCT: 38.8 % (ref 36.0–46.0)
Hemoglobin: 12.9 g/dL (ref 12.0–15.0)
Lymphs Abs: 2.8 10*3/uL (ref 0.7–4.0)
MCV: 87.4 fl (ref 78.0–100.0)
Monocytes Absolute: 0.9 10*3/uL (ref 0.1–1.0)
Monocytes Relative: 9.4 % (ref 3.0–12.0)
Neutro Abs: 5.3 10*3/uL (ref 1.4–7.7)
Platelets: 349 10*3/uL (ref 150.0–400.0)
WBC: 9.6 10*3/uL (ref 4.5–10.5)

## 2012-12-23 LAB — BASIC METABOLIC PANEL
BUN: 20 mg/dL (ref 6–23)
Calcium: 9.8 mg/dL (ref 8.4–10.5)
GFR: 70.13 mL/min (ref >60.00)
Potassium: 4.6 mEq/L (ref 3.5–5.1)
Sodium: 140 mEq/L (ref 135–145)

## 2012-12-23 LAB — LIPID PANEL
HDL: 40.9 mg/dL (ref >39.00)
LDL Cholesterol: 66 mg/dL (ref 0–99)
VLDL: 23.6 mg/dL (ref 0.0–40.0)

## 2012-12-23 LAB — TSH: TSH: 1.17 u[IU]/mL (ref 0.35–5.50)

## 2013-01-07 ENCOUNTER — Encounter: Payer: Self-pay | Admitting: Pulmonary Disease

## 2013-01-07 ENCOUNTER — Ambulatory Visit (INDEPENDENT_AMBULATORY_CARE_PROVIDER_SITE_OTHER): Payer: BC Managed Care – PPO | Admitting: Pulmonary Disease

## 2013-01-07 VITALS — BP 126/78 | HR 73 | Temp 97.5°F | Ht 69.0 in | Wt 165.0 lb

## 2013-01-07 DIAGNOSIS — E119 Type 2 diabetes mellitus without complications: Secondary | ICD-10-CM

## 2013-01-07 DIAGNOSIS — J309 Allergic rhinitis, unspecified: Secondary | ICD-10-CM

## 2013-01-07 DIAGNOSIS — D136 Benign neoplasm of pancreas: Secondary | ICD-10-CM

## 2013-01-07 DIAGNOSIS — C50919 Malignant neoplasm of unspecified site of unspecified female breast: Secondary | ICD-10-CM

## 2013-01-07 DIAGNOSIS — M899 Disorder of bone, unspecified: Secondary | ICD-10-CM

## 2013-01-07 DIAGNOSIS — F411 Generalized anxiety disorder: Secondary | ICD-10-CM

## 2013-01-07 DIAGNOSIS — E7800 Pure hypercholesterolemia, unspecified: Secondary | ICD-10-CM

## 2013-01-07 DIAGNOSIS — I10 Essential (primary) hypertension: Secondary | ICD-10-CM

## 2013-01-07 DIAGNOSIS — J45909 Unspecified asthma, uncomplicated: Secondary | ICD-10-CM

## 2013-01-07 DIAGNOSIS — Z0000 Encounter for general adult medical examination without abnormal findings: Secondary | ICD-10-CM

## 2013-01-07 DIAGNOSIS — M8580 Other specified disorders of bone density and structure, unspecified site: Secondary | ICD-10-CM | POA: Insufficient documentation

## 2013-01-07 MED ORDER — GLIMEPIRIDE 1 MG PO TABS: ORAL_TABLET | ORAL | Status: DC

## 2013-01-07 NOTE — Progress Notes (Addendum)
Subjective:    Patient ID: Amber Washington, female    DOB: 09-19-1955, 57 y.o.   MRN: [id number]  HPI 57 y/o WF here for a follow up visit... she has multiple medical problems as noted below...   ~  July 08, 2011:  Yearly ROV & her CC= IBS-like symptoms w/ mild cramping & irreg BMs w/ gas etc (she has seen DrMedoff in the past);  otherw she reports breathing is good w/o asthma exac, BP controlled on diet rx alone, FLP looks good on Lip10, but BS/A1c are up w/ her 10# wt gain & we decided to add Glimep1mg /d (see below)... She is allergic to flu vaccine; see prob list below:  ~  January 06, 2012:  55mo ROV & Amber Washington has noted incr asthma symptoms this spring- watery eyes, sneezing, congestion, SOB, "tight",sl wheezy, etc; taking her Singulair daily & using the Proair daily;  PFTs below w/ mild to mod airflow obstruction & we discusse4d need for controller med> Depo80/ Dosepak/ SYMBICORT250Bid...  otherw reports stable & we reviewed meds below> on all3 DM meds w/ FBS=126, A1c=6.7 Improved...  See prob list>> PFTs 4/13:  FVC=3.27 (85%), FEV1=2.14 (70%), %1sec=65, mid-flows= 40% predicted... LABS 4/13:  BS=126, A1c=6.7, other chems= ok...  ~  July 07, 2102:  55mo ROV & Amber Washington has had a good interval- but c/o being tired, caring for new grandbaby (daugh lives w/ her); notes flair of eczema on elbows & we discussed Rx w/ LidexE cream... We reviewed the following medical problems during today's office visit>>     AR, Asthma> on Singulair10, Advair250 (just once/d per pt), ProairHFA prn; she had resp exac 4/13 w/ Advair & Dosepak Rx> improved.    BorderlineHBP> on ASA81; BP= 122/80 & she denies CP, palpit, SOB, edema, etc...    Chol> on Lip10; FLP showed TChol 145, TG 94, HDL 46, LDL 80    DM> on Metop500Bid, Glimep1mg , Januvia100; FBS= 143, A1c= 7.0; she is rec to get on better diet, exercise, etc...    GI- IBS, polyps> off prev Bentyl; followed by [facility]- last colon 8/10 was neg x one benign polyp, no  adenomatous changes...    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    Hx breast cancer> right breast ca diagnosed 2000- surg, XRT, ChemoRx by DrSherrill; no known recurrence & she is followed by Amber Washington now...    Osteopenia> off calcium, on VitD2000; she gets her BMDs from DrLomax, last 7/11 w/ TScore -2.2 in [facility] (she reports f/u 2013 was stable).    Anxiety> on Xanax0.5; she notes that Daugh & grandbaby have moved in w/ them... We reviewed prob list, meds, xrays and labs> see below for updates >> she refuses Flu shot, OK TDAP today...  ~  Jan 07, 2013:  55mo ROV & CPX> Amber Washington has had a good interval overall and has lost 10# on diet & exercise program;  She reports 2 recent hypoglycemic episodes that occurred in the middle of the night & responded to oral intake;  We decided to DECREASE he Glimep1mg  to 1/2 tab Qam & she will continue to monitor...      AR, Asthma> on Singulair10, [id number] (just once/d per pt), ProairHFA prn; she denies resp infections or asthma exac; no cough, sput, SOB, wheezing, etc...    BorderlineHBP> on ASA81; BP= 126/78 & she denies CP, palpit, SOB, edema, etc...    Chol> on Lip10; FLP shows TChol 130,  TG 118, HDL 41, LDL 66    DM> on Metop500Bid, Glimep1mg , Januvia100; FBS= 118, A1c= 6.5; she has lost 10# down to 165# & had 2 recent hypoglycemic episodes; we decided to decr the Glimep1mg  to 1/2 tab Qam...    GI- IBS, polyps> followed by [facility]- last colon 8/10 was neg x one benign polyp, no adenomatous changes...    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    Hx breast cancer> right breast ca diagnosed 2000- surg, XRT, ChemoRx by DrSherrill; no known recurrence & she is followed by her GYN now (mammogram 8/13- neg)...    Osteopenia> off calcium, on VitD2000; she gets her BMDs from her GYN, report from  7/11 w/ TScore -2.2 in [facility] (she reports f/u 2013 was stable).    Anxiety> on Xanax0.5; she notes that Daugh & grandbaby have moved in w/ them...  We reviewed prob list, meds, xrays and labs> see below for updates >> she declines the seasonal Flu vaccine (allegy to eggs);  Depression screen is neg... LABS 4/14:  FLP- at goals on Lip10;  Chems- ok w/ BS=118 A1c=6.5;  CBC- wnl;  TSH=1.17;  VitD=58;  Umicroalb was neg 10/13... EKG 5/14 showed NSR, rate68, WNL, NAD...           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  ALLERGIC RHINITIS (ICD-477.9) - life-long hx of allergic rhinitis and asthma, with eczema as well... allergy testing in 80's w/ markedly pos reactions to trees, grasses, molds, dust, dogs, cats, feathers, shellfish, eggs & other foods including wheat, spinach, beans... ? if IgE / Rast checked- we don't have Vol I of her old chart avail... prev nasal polyp surgery in 1980's by DrKraus... ~  4/13: she notes incr allergy symptoms this spring> on Singulair daily & advised OTC Antihist, Saline nasal mist, etc...  ASTHMA (ICD-493.90) - hx of asthma and allergies well controlled on SINGULAIR 10mg Qod & PROAIR HFA Prn... very active, exercising some & no problems w/ breathing... ~  11/10: f/u CXR clear... we discussed trying to wean the Singulair slowly... ~  11/11: states she's using the Singulair Qod & hasn't needed the Proair rescue. ~  CXR 10/12 showed norm heart size, clear lungs, surg clips right axilla & left upper quad, NAD... ~  4/13:  She notes incr Asthma symptoms w/ tightness, congestion, some wheezing; Rx w/ Depo/ Dosepak & ADVAIR250-Bid... ~  PFTs 4/13:  FVC=3.27 (85%), FEV1=2.14 (70%), %1sec=65, mid-flows= 40% predicted... ~  5/14:  on Singulair10, Advair250 (just once/d per pt), ProairHFA prn; she denies resp infections or asthma exac; no cough, sput, SOB,  wheezing, etc  HYPERTENSION, BORDERLINE (ICD-401.9) - on diet alone... ~  11/11:  BP= 122/84 (wt=160#) & she denies HA, fatigue, visual changes, CP, palipit, dizziness, syncope, dyspnea, edema, etc...  ~  10/12:  BP= 130/86 (wt=169#) & she remains asymptomatic... ~  4/13:  BP= 128/80 (wt=172#) & she denies HA, CP, palpit, edema, etc... ~  10/13: on ASA81 + diet; BP= 122/80 & she denies CP, palpit, SOB, edema, etc... ~  5/14: on ASA81; BP= 126/78 after 10# wt loss & she denies CP, palpit, SOB, edema, etc  HYPERCHOLESTEROLEMIA (ICD-272.0) - on LIPITOR 10mg  daily... takes med regularly & tolerates well...  ~  FLP 9/08 showed TChol 132, TG 52, HDL 50,  LDL 72... rec- continue same med. ~  FLP 10/09 showed TChol 137, TG 71, HDL 46, LDL 76... rec- continue same. ~  FLP 11/10 showed Tchol 145, TG 84, HDL 49, LDL 79 ~  FLP 5/11 on Lip10 showed TChol 153, TG 52, HDL 52, LDL 91 ~  FLP 10/12 on Lip10 showed TChol 153, TG 76, HDL 53, LDL 85 ~  FLP 10/13 on Lip10 showed TChol 145, TG 94, HDL 46, LDL 80 ~  FLP 4/14 on Lip10 showed TChol 130, TG 118, HDL 41, LDL 66   DIABETES MELLITUS (ICD-250.00) - started after her partial pancreatectomy in 2000... on METFORMIN ER 500mg - 2Tabs/d,  & JANUVIA 100mg /d... BS at home are 100-120 range... NOTE: she had SPLENECTOMY at the same time. ~  labs 9/08 showed BS= 125,  HgA1c= 6.6.Marland KitchenMarland Kitchen rec- same meds + diet. ~  Ophthalmology= DrHecker 9/09 no diabetic retinopathy... ~  labs 10/09 showed BS= 163,  HgA1c= 6.8.Marland Kitchen. not as good w/ weight gain, rec- same med + diet. ~  labs 11/10 showed BS= 97, A1c= 7.0.Marland KitchenMarland Kitchen reminder to avoid sweets etc... ~  labs in 2011 showed BS= 120-140, A1c= 7.1-7.4 on MetformER500-2Qam + Januv100/d. ~  9/12:  Neg Ophthalmology eval by DrHecker, no retinopathy... ~  Labs 10/12 showed BS= 156, A1c= 7.6... We decided to add GLIMEPIRIDE 1mg Qam + diet & get wt back down. ~  Labs 4/13 on all showed FBS=126, A1c=6.7.Marland KitchenMarland Kitchen Improved, continue same... ~  Eye eval  9/13 from DrHecker was neg w/o retinopathy or macular edema... ~  Labs 10/13 on on Metop500Bid, Glimep1mg , Januvia100; FBS= 143, A1c= 7.0; needs better diet, same meds for now. ~  on Metop500Bid, Glimep1mg , Januvia100; FBS= 118, A1c= 6.5; she has lost 10# down to 165# & had 2 recent hypoglycemic episodes; we decided to decr the Glimep1mg  to 1/2 tab Qam.  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  IBS & COLON POLYPS >> followed by [facility] but we don't have notes from him... ~  Colonoscopy 8/10 showed one 8mm sessile polyp in asc colon> bx= polypoid colonic mucosa w/ benign lymphoid aggregates...  Hx of ADENOCARCINOMA, BREAST (ICD-174.9) - right breast cancer diagnosed in 2000 w/ stage IIA disease... right lumpectomy & node bx by DrSreck, XRT by DrGoodchild, & CMF chemotherapy by DrSherrill... no known recurrence in f/u and "released" by DrSherrill. ~  f/u Mammograms [facility] have all been neg... ~  5/14: right breast ca diagnosed 2000- surg, XRT, ChemoRx by DrSherrill; no known recurrence & she is followed by her GYN now.  OSTEOPOROSIS (ICD-733.00) - prev on Actonel 35mg /wk, calcium, vitamins, Vit D 2000 u daily (off Actonel now per DrLomax)... ~  BMD by DrLomax 2007 showed TScores -2.1 in Spine, & -1.7 in [facility]... ~  BMD by DrLomax in 2009- we don't have these results... ~  BMD by DrLomax 7/11 showed TScores -2.0 Spine, & -2.2 FemNeck ~  BMD 2013> we don't have copy but pt reports that it was stable...  ANXIETY (ICD-300.00) - on ALPRAZOLAM 0.5mg  Prn...  Health Maintenance - takes ASA 81mg /d... GYN= DrLomax for PAP, Mammograms [facility]), and BMD's... had PNEUMOVAX 2000... TDAP given 10/13... can't get flu shots w/ allergy to eggs...   Past Surgical History  Procedure Laterality Date  . Surgery on neck for removal of  birthmark    . Right breast cancer  sugery  1995  . Distal pancreatectomy and splenectomy for mucinous cystadenoma of pancreas  2000    Outpatient Encounter Prescriptions as of 01/07/2013  Medication Sig Dispense Refill  . atorvastatin (LIPITOR) 10 MG tablet Take 1 tablet by mouth once a day  90 tablet  3  . BD ULTRA-FINE LANCETS lancets Use as instructed  100 each  12  . albuterol (PROVENTIL HFA;VENTOLIN HFA) 108 (90 BASE) MCG/ACT inhaler Inhale 2 puffs into the lungs every 6 (six) hours as needed.  1 Inhaler  6  . ALPRAZolam (XANAX) 0.5 MG tablet TAKE ONE TABLET BY MOUTH THREE TIMES DAILY AS NEEDED FOR SLEEP OR ANXIETY  90 tablet  5  . aspirin 81 MG tablet Take 81 mg by mouth daily.        . Blood Glucose Monitoring Suppl (ACCU-CHEK AVIVA PLUS) W/DEVICE KIT 1 kit by Does not apply route once.  1 kit  0  . Cholecalciferol (VITAMIN D) 2000 UNITS CAPS Take 1 capsule by mouth daily.        . fluocinonide-emollient (LIDEX-E) 0.05 % cream Apply topically 2 (two) times daily as needed.  30 g  5  . Fluticasone-Salmeterol (ADVAIR DISKUS) 250-50 MCG/DOSE AEPB Inhale 1 puff into the lungs 2 (two) times daily.  3 each  3  . glimepiride (AMARYL) 1 MG tablet Take 1 tablet (1 mg total) by mouth daily before breakfast.  90 tablet  3  . glucose blood (ACCU-CHEK AVIVA PLUS) test strip TEST ONCE DAILY,  DX  250.00  100 each  12  . HYDROcodone-acetaminophen (LORTAB 5) 5-500 MG per tablet Take 1 tablet by mouth every 6 (six) hours as needed for pain.  15 tablet  0  . metFORMIN (GLUCOPHAGE-XR) 500 MG 24 hr tablet Take one tablet by mouth two times daily  180 tablet  3  . montelukast (SINGULAIR) 10 MG tablet Take 1 tablet (10 mg total) by mouth at bedtime.  90 tablet  3  . sitaGLIPtin (JANUVIA) 100 MG tablet Take 1 tablet (100 mg total) by mouth daily.  90 tablet  3   No facility-administered encounter medications on file as of 01/07/2013.    Allergies  Allergen Reactions  . Other     Flu vaccine due to egg  whites that she was allergic to as a child    Current Medications, Allergies, Past Medical History, Past Surgical History, Family History, and Social History were reviewed in [employer] record.    Review of Systems        The patient complains of dyspnea on exertion.  The patient denies fever, chills, sweats, anorexia, fatigue, weakness, malaise, weight loss, sleep disorder, blurring, diplopia, eye irritation, eye discharge, vision loss, eye pain, photophobia, earache, ear discharge, tinnitus, decreased hearing, nasal congestion, nosebleeds, sore throat, hoarseness, chest pain, palpitations, syncope, orthopnea, PND, peripheral edema, cough, dyspnea at rest, excessive sputum, hemoptysis, wheezing, pleurisy, nausea, vomiting, diarrhea, constipation, change in bowel habits, abdominal pain, melena, hematochezia, jaundice, gas/bloating, indigestion/heartburn, dysphagia, odynophagia, dysuria, hematuria, urinary frequency, urinary hesitancy, nocturia, incontinence, back pain, joint pain, joint swelling, muscle cramps, muscle weakness, stiffness, arthritis, sciatica, restless legs, leg pain at night, leg pain with exertion, rash, itching, dryness, suspicious lesions, paralysis, paresthesias, seizures, tremors, vertigo, transient blindness, frequent falls, frequent headaches, difficulty walking, depression, anxiety, memory loss, confusion, cold intolerance, heat intolerance, polydipsia, polyphagia, polyuria, unusual weight change, abnormal bruising, bleeding, enlarged lymph nodes, urticaria, allergic rash, hay fever, and recurrent infections.   Objective:   Physical  Exam     WD, WN, 57 y/o WF in NAD... GENERAL:  Alert & oriented; pleasant & cooperative... HEENT:  [place]/AT, EOM-wnl, PERRLA, EACs-clear, TMs-wnl, NOSE-clear, THROAT-clear & wnl. NECK:  Supple w/ full ROM; no JVD; normal carotid impulses w/o bruits; no thyromegaly or nodules palpated; no lymphadenopathy. Scar on neck from  remote birthmark removal... CHEST:  Clear to P & A; without wheezes/ rales/ or rhonchi. Right breast scar from lumpectomy... HEART:  Regular Rhythm; without murmurs/ rubs/ or gallops. ABDOMEN:  Scar of surg, soft & nontender; normal bowel sounds; no organomegaly or masses detected. EXT: without deformities or arthritic changes; no varicose veins/ venous insuffic/ or edema. NEURO:  CN's intact; motor testing normal; sensory testing normal; gait normal & balance OK. DERM:  No lesions noted; no rash etc...  RADIOLOGY DATA:  Reviewed in the EPIC EMR & discussed w/ the patient...  LABORATORY DATA:  Reviewed in the EPIC EMR & discussed w/ the patient...   Assessment & Plan:    AR & ASTHMA> stable on Advair, Proair rescue & singulair w/o recent exac...  HBP>  Controlled on diet alone, & improved w/ wt loss...  CHOL>  Stable on Lip10 + diet...  DM>  Her control is sl too tight in light of her 10# wt loss and two hypoglycemic episodes; we decided to decr the Glimep1mg  to 1/2 tab Qam...  GI> Prob IBS & Polyps>  Followed by [facility] & up to date on colon screen...  Hx Breast Cancer>  No known recurrence, see details above, she gets yearly f/u mammograms etc...  Osteopenia>  Prev on Actonel but now drug holiday per [facility]; he does her BMDs & we reviewed these reportsw...  Other medical problems as noted...   Patient's Medications  New Prescriptions   No medications on file  Previous Medications   ALBUTEROL (PROVENTIL HFA;VENTOLIN HFA) 108 (90 BASE) MCG/ACT INHALER    Inhale 2 puffs into the lungs every 6 (six) hours as needed.   ALPRAZOLAM (XANAX) 0.5 MG TABLET    TAKE ONE TABLET BY MOUTH THREE TIMES DAILY AS NEEDED FOR SLEEP OR ANXIETY   ASPIRIN 81 MG TABLET    Take 81 mg by mouth daily.     ATORVASTATIN (LIPITOR) 10 MG TABLET    Take 1 tablet by mouth once a day   BD ULTRA-FINE LANCETS LANCETS    Use as instructed   BLOOD GLUCOSE MONITORING SUPPL (ACCU-CHEK AVIVA PLUS) W/DEVICE KIT     1 kit by Does not apply route once.   CHOLECALCIFEROL (VITAMIN D) 2000 UNITS CAPS    Take 1 capsule by mouth daily.     FLUOCINONIDE-EMOLLIENT (LIDEX-E) 0.05 % CREAM    Apply topically 2 (two) times daily as needed.   FLUTICASONE-SALMETEROL (ADVAIR DISKUS) 250-50 MCG/DOSE AEPB    Inhale 1 puff into the lungs 2 (two) times daily.   GLUCOSE BLOOD (ACCU-CHEK AVIVA PLUS) TEST STRIP    TEST ONCE DAILY,  DX  250.00   METFORMIN (GLUCOPHAGE-XR) 500 MG 24 HR TABLET    Take one tablet by mouth two times daily   MONTELUKAST (SINGULAIR) 10 MG TABLET    Take 1 tablet (10 mg total) by mouth at bedtime.   SITAGLIPTIN (JANUVIA) 100 MG TABLET    Take 1 tablet (100 mg total) by mouth daily.  Modified Medications   Modified Medication Previous Medication   GLIMEPIRIDE (AMARYL) 1 MG TABLET glimepiride (AMARYL) 1 MG tablet      Take 1/2 tablet by mouth  every morning    Take 1 tablet (1 mg total) by mouth daily before breakfast.  Discontinued Medications   HYDROCODONE-ACETAMINOPHEN (LORTAB 5) 5-500 MG PER TABLET    Take 1 tablet by mouth every 6 (six) hours as needed for pain.

## 2013-01-07 NOTE — Patient Instructions (Addendum)
Today we updated your med list in our EPIC system...     We decided to DECREASE your GLIMEPIRIDE 1mg  to 1/2 tab each AM...    Call us if you have any further hypoglycemic episodes...  Keep up the great job w/ diet & exercise...  Call for any questions...  Let's plan a follow up visit in 34mo, sooner if needed for problems.Marland KitchenMarland Kitchen

## 2013-04-01 ENCOUNTER — Other Ambulatory Visit: Payer: Self-pay

## 2013-04-01 DIAGNOSIS — Z1231 Encounter for screening mammogram for malignant neoplasm of breast: Secondary | ICD-10-CM

## 2013-04-22 ENCOUNTER — Ambulatory Visit
Admission: RE | Admit: 2013-04-22 | Discharge: 2013-04-22 | Disposition: A | Discharge status: Home / Self Care | Payer: BC Managed Care – PPO | Source: Ambulatory Visit

## 2013-04-22 DIAGNOSIS — Z1231 Encounter for screening mammogram for malignant neoplasm of breast: Secondary | ICD-10-CM

## 2013-05-17 ENCOUNTER — Other Ambulatory Visit: Payer: Self-pay | Admitting: Pulmonary Disease

## 2013-05-23 ENCOUNTER — Telehealth: Payer: Self-pay | Admitting: Pulmonary Disease

## 2013-05-23 MED ORDER — AMLODIPINE BESYLATE 5 MG PO TABS: 5.0000 mg | ORAL_TABLET | Freq: Every day | ORAL | Status: DC

## 2013-05-23 NOTE — Telephone Encounter (Signed)
Pt aware of recs. RX has been sent. appt scheduled. Nothing further needed

## 2013-05-23 NOTE — Telephone Encounter (Signed)
Per SN---low sodium diet and start on amlodipine 5 mg  1 daily and refill prn.  ROV with BP check in 1 month.

## 2013-05-23 NOTE — Telephone Encounter (Signed)
I spoke with pt. She stated she saw her chiropractor on Friday for tension in her neck. She checked pt BP and it 140/99. Told pt to monitor her BP over the weekend. She stated only one time the lowest it got was 130's/80's. Mainly it was staying in the 140's-150's/80's. Before I called her this AM it was 146/86 and the nurse at school checked her BP. She stated she even went to check her BP at [facility] to make sure her BP cuff was not off and it was saying the same 157/80's. She does not take any BP medication. She stated nothing anything in her life has changed so not sure what is going on.  Please advise SN thanks Last OV 01/07/13 Pending 07/18/13 Allergies  Allergen Reactions  . Other     Flu vaccine due to egg whites that she was allergic to as a child

## 2013-05-27 ENCOUNTER — Telehealth: Payer: Self-pay | Admitting: Pulmonary Disease

## 2013-05-27 NOTE — Telephone Encounter (Signed)
I spoke with the pt and she started BP medication on Tuesday and was concerned because she is still getting some high readings. She states today it was 130's over 70's. I advised this is an ok reading and that it takes the medication time to get in your system to do its job and lower the bp to a normal level and stay there. I advised to continue to monitor and follow SN recs and f/u in 1 month. Advised if BP elevates like it originally was and continues to remain there and if she has HA, chest pain, blurry vision, etc to call. Carron Curie, CMA

## 2013-06-23 ENCOUNTER — Encounter: Payer: Self-pay | Admitting: Pulmonary Disease

## 2013-06-23 ENCOUNTER — Ambulatory Visit (INDEPENDENT_AMBULATORY_CARE_PROVIDER_SITE_OTHER): Payer: BC Managed Care – PPO | Admitting: Pulmonary Disease

## 2013-06-23 ENCOUNTER — Other Ambulatory Visit (INDEPENDENT_AMBULATORY_CARE_PROVIDER_SITE_OTHER): Payer: BC Managed Care – PPO

## 2013-06-23 VITALS — BP 112/68 | HR 71 | Temp 98.0°F | Ht 69.0 in | Wt 166.0 lb

## 2013-06-23 DIAGNOSIS — C50919 Malignant neoplasm of unspecified site of unspecified female breast: Secondary | ICD-10-CM

## 2013-06-23 DIAGNOSIS — D136 Benign neoplasm of pancreas: Secondary | ICD-10-CM

## 2013-06-23 DIAGNOSIS — F411 Generalized anxiety disorder: Secondary | ICD-10-CM

## 2013-06-23 DIAGNOSIS — E119 Type 2 diabetes mellitus without complications: Secondary | ICD-10-CM

## 2013-06-23 DIAGNOSIS — I10 Essential (primary) hypertension: Secondary | ICD-10-CM

## 2013-06-23 DIAGNOSIS — M8580 Other specified disorders of bone density and structure, unspecified site: Secondary | ICD-10-CM

## 2013-06-23 DIAGNOSIS — J45909 Unspecified asthma, uncomplicated: Secondary | ICD-10-CM

## 2013-06-23 DIAGNOSIS — M899 Disorder of bone, unspecified: Secondary | ICD-10-CM

## 2013-06-23 DIAGNOSIS — E7800 Pure hypercholesterolemia, unspecified: Secondary | ICD-10-CM

## 2013-06-23 LAB — BASIC METABOLIC PANEL
CO2: 29 mEq/L (ref 19–32)
Chloride: 100 mEq/L (ref 96–112)
Potassium: 4.8 mEq/L (ref 3.5–5.1)
Sodium: 138 mEq/L (ref 135–145)

## 2013-06-23 LAB — HEMOGLOBIN A1C: Hgb A1c MFr Bld: 7.1 % — ABNORMAL HIGH (ref 4.6–6.5)

## 2013-06-23 MED ORDER — LOSARTAN POTASSIUM 50 MG PO TABS: 50.0000 mg | ORAL_TABLET | Freq: Every day | ORAL | Status: DC

## 2013-06-23 NOTE — Patient Instructions (Signed)
Today we updated your med list in our EPIC system...     We decided to STOP the Amlodipine & START LOSARTAN 50mg  one tab daily...    Monitor your BP as we discussed & call me w/ any problems/ questions/ etc...  Today we did your follow up DM blood work...    We will contact you w/ the results when available...   Get back into your exercise routine and take care of Alencia...  Let's plan a follow up visit in 26mo, sooner if needed for problems.Marland KitchenMarland Kitchen

## 2013-06-23 NOTE — Progress Notes (Signed)
Subjective:    Patient ID: Amber Washington, female    DOB: 1956/08/12, 57 y.o.   MRN: [id number]  HPI 57 y/o WF here for a follow up visit... she has multiple medical problems as noted below...   ~  January 06, 2012:  92mo ROV & Amber Washington has noted incr asthma symptoms this spring- watery eyes, sneezing, congestion, SOB, "tight",sl wheezy, etc; taking her Singulair daily & using the Proair daily;  PFTs below w/ mild to mod airflow obstruction & we discusse4d need for controller med> Depo80/ Dosepak/ SYMBICORT250Bid...  otherw reports stable & we reviewed meds below> on all3 DM meds w/ FBS=126, A1c=6.7 Improved...  See prob list>> PFTs 4/13:  FVC=3.27 (85%), FEV1=2.14 (70%), %1sec=65, mid-flows= 40% predicted... LABS 4/13:  BS=126, A1c=6.7, other chems= ok...  ~  July 07, 2102:  92mo ROV & Amber Washington has had a good interval- but c/o being tired, caring for new grandbaby (daugh lives w/ her); notes flair of eczema on elbows & we discussed Rx w/ LidexE cream... We reviewed the following medical problems during today's office visit>>     AR, Asthma> on Singulair10, Advair250 (just once/d per pt), ProairHFA prn; she had resp exac 4/13 w/ Advair & Dosepak Rx> improved.    BorderlineHBP> on ASA81; BP= 122/80 & she denies CP, palpit, SOB, edema, etc...    Chol> on Lip10; FLP showed TChol 145, TG 94, HDL 46, LDL 80    DM> on Metop500Bid, Glimep1mg , Januvia100; FBS= 143, A1c= 7.0; she is rec to get on better diet, exercise, etc...    GI- IBS, polyps> off prev Bentyl; followed by [facility]- last colon 8/10 was neg x one benign polyp, no adenomatous changes...    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    Hx breast cancer> right breast ca diagnosed 2000- surg, XRT, ChemoRx by DrSherrill; no known recurrence & she is followed by [facility] Prima now...    Osteopenia> off calcium, on VitD2000; she gets her BMDs from DrLomax, last 7/11 w/ TScore  -2.2 in [facility] (she reports f/u 2013 was stable).    Anxiety> on Xanax0.5; she notes that Daugh & grandbaby have moved in w/ them... We reviewed prob list, meds, xrays and labs> see below for updates >> she refuses Flu shot, OK TDAP today...  ~  Jan 07, 2013:  92mo ROV & CPX> Amber Washington has had a good interval overall and has lost 10# on diet & exercise program;  She reports 2 recent hypoglycemic episodes that occurred in the middle of the night & responded to oral intake;  We decided to DECREASE he Glimep1mg  to 1/2 tab Qam & she will continue to monitor...     AR, Asthma> on Singulair10, [id number] (just once/d per pt), ProairHFA prn; she denies resp infections or asthma exac; no cough, sput, SOB, wheezing, etc...    BorderlineHBP> on ASA81; BP= 126/78 & she denies CP, palpit, SOB, edema, etc...    Chol> on Lip10; FLP shows TChol 130, TG 118, HDL 41, LDL 66    DM> on Metop500Bid, Glimep1mg , Januvia100; FBS= 118, A1c= 6.5; she has lost 10# down to 165# & had 2 recent hypoglycemic episodes; we decided to decr the Glimep1mg  to 1/2 tab Qam...    GI- IBS, polyps> followed by [facility]- last colon 8/10 was neg x one benign polyp, no adenomatous changes...    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    Hx breast cancer> right breast ca diagnosed 2000- surg, XRT, ChemoRx by DrSherrill; no known recurrence & she is followed by her GYN now (mammogram 8/13- neg)...    Osteopenia> off calcium, on VitD2000; she gets her BMDs from her GYN, report from 7/11 w/ TScore -2.2 in [facility] (she reports f/u 2013 was stable).    Anxiety> on Xanax0.5; she notes that Daugh & grandbaby have moved in w/ them... We reviewed prob list, meds, xrays and labs> see below for updates >> she declines the seasonal Flu vaccine (allegy to eggs);  Depression screen is neg... LABS 4/14:  FLP- at goals on Lip10;  Chems- ok w/ BS=118 A1c=6.5;  CBC- wnl;  TSH=1.17;   VitD=58;  Umicroalb was neg 10/13... EKG 5/14 showed NSR, rate68, WNL, NAD...   ~  June 23, 2013:  5-48mo ROV & add-on appt> Amber Washington describes recent elev BP assoc w/ onset on neck pain, had chiropractic rx but BP remained elev even when pain resolved; we called in Amlod5 but states feeling tired on this med & wants to switch; we discussed change to [facility] & monitor BP at home...     AR, Asthma> on Singulair10, [id number] (just once/d per pt), ProairHFA prn; she denies resp infections or asthma exac; no cough, sput, SOB, wheezing, etc...    BorderlineHBP> on ASA81 & Amlod5; BP= 112/68 & she denies CP, palpit, SOB, edema, etc; but she is tired/ fatigued & wants to stop the Amlod & ch to [facility]...    Chol> on Lip10; FLP 4/14 shows TChol 130, TG 118, HDL 41, LDL 66    DM> on Metop500Bid, Glimep1mg -1/2, Januvia100; FBS= 97, A1c= 7.1; wt is stable at 166# w/o further hypoglycemic episodes...    GI- IBS, polyps> followed by [facility]- last colon 8/10 was neg x one benign polyp, no adenomatous changes...    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    Hx breast cancer> right breast ca diagnosed 2000- surg, XRT, ChemoRx by DrSherrill; no known recurrence & she is followed by her GYN now (mammogram 8/13- neg)...    Osteopenia> off calcium, on VitD2000; she gets her BMDs from her GYN, report from 7/11 w/ TScore -2.2 in [facility] (she reports f/u 2013 was stable).    Anxiety> on Xanax0.5; she notes that Daugh & grandbaby have moved in w/ them... 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  ALLERGIC RHINITIS (ICD-477.9) - life-long hx of allergic rhinitis and asthma, with eczema as well...  allergy testing in 80's w/ markedly pos reactions to trees, grasses, molds, dust, dogs, cats, feathers, shellfish, eggs & other foods including wheat, spinach, beans... ? if IgE / Rast checked- we don't have Vol I of her old chart avail... prev nasal polyp surgery in 1980's by DrKraus... ~  4/13: she notes incr allergy symptoms this spring> on Singulair daily & advised OTC Antihist, Saline nasal mist, etc...  ASTHMA (ICD-493.90) - hx of asthma and allergies well controlled on SINGULAIR 10mg Qod & PROAIR HFA Prn... very active, exercising some & no problems w/ breathing... ~  11/10: f/u CXR clear.Marland KitchenMarland Kitchen  we discussed trying to wean the Singulair slowly... ~  11/11: states she's using the Singulair Qod & hasn't needed the Proair rescue. ~  CXR 10/12 showed norm heart size, clear lungs, surg clips right axilla & left upper quad, NAD... ~  4/13:  She notes incr Asthma symptoms w/ tightness, congestion, some wheezing; Rx w/ Depo/ Dosepak & ADVAIR250-Bid... ~  PFTs 4/13:  FVC=3.27 (85%), FEV1=2.14 (70%), %1sec=65, mid-flows= 40% predicted... ~  5/14 & 10/14:  on Singulair10, Advair250 (just once/d per pt), ProairHFA prn; she denies resp infections or asthma exac; no cough, sput, SOB, wheezing, etc  HYPERTENSION, BORDERLINE (ICD-401.9) - on diet alone... ~  11/11:  BP= 122/84 (wt=160#) & she denies HA, fatigue, visual changes, CP, palipit, dizziness, syncope, dyspnea, edema, etc...  ~  10/12:  BP= 130/86 (wt=169#) & she remains asymptomatic... ~  4/13:  BP= 128/80 (wt=172#) & she denies HA, CP, palpit, edema, etc... ~  10/13: on ASA81 + diet; BP= 122/80 & she denies CP, palpit, SOB, edema, etc... ~  5/14: on ASA81; BP= 126/78 after 10# wt loss & she denies CP, palpit, SOB, edema, etc ~  10/14: she called w/ BP elev from neck pain, anxiety; Amlodip5 called in & BP improved to 112/68;   HYPERCHOLESTEROLEMIA (ICD-272.0) - on LIPITOR 10mg  daily... takes med regularly & tolerates well...  ~  FLP 9/08 showed TChol  132, TG 52, HDL 50, LDL 72... rec- continue same med. ~  FLP 10/09 showed TChol 137, TG 71, HDL 46, LDL 76... rec- continue same. ~  FLP 11/10 showed Tchol 145, TG 84, HDL 49, LDL 79 ~  FLP 5/11 on Lip10 showed TChol 153, TG 52, HDL 52, LDL 91 ~  FLP 10/12 on Lip10 showed TChol 153, TG 76, HDL 53, LDL 85 ~  FLP 10/13 on Lip10 showed TChol 145, TG 94, HDL 46, LDL 80 ~  FLP 4/14 on Lip10 showed TChol 130, TG 118, HDL 41, LDL 66   DIABETES MELLITUS (ICD-250.00) - started after her partial pancreatectomy in 2000... on METFORMIN ER 500mg - 2Tabs/d,  & JANUVIA 100mg /d... BS at home are 100-120 range... NOTE: she had SPLENECTOMY at the same time. ~  labs 9/08 showed BS= 125,  HgA1c= 6.6.Marland KitchenMarland Kitchen rec- same meds + diet. ~  Ophthalmology= DrHecker 9/09 no diabetic retinopathy... ~  labs 10/09 showed BS= 163,  HgA1c= 6.8.Marland Kitchen. not as good w/ weight gain, rec- same med + diet. ~  labs 11/10 showed BS= 97, A1c= 7.0.Marland KitchenMarland Kitchen reminder to avoid sweets etc... ~  labs in 2011 showed BS= 120-140, A1c= 7.1-7.4 on MetformER500-2Qam + Januv100/d. ~  9/12:  Neg Ophthalmology eval by DrHecker, no retinopathy... ~  Labs 10/12 showed BS= 156, A1c= 7.6... We decided to add GLIMEPIRIDE 1mg Qam + diet & get wt back down. ~  Labs 4/13 on all showed FBS=126, A1c=6.7.Marland KitchenMarland Kitchen Improved, continue same... ~  Eye eval 9/13 from DrHecker was neg w/o retinopathy or macular edema... ~  Labs 10/13 on on Metop500Bid, Glimep1mg , Januvia100; FBS= 143, A1c= 7.0; needs better diet, same meds for now. ~  5/14: on Metop500Bid, Glimep1mg , Januvia100; FBS= 118, A1c= 6.5; she has lost 10# down to 165# & had 2 recent hypoglycemic episodes; we decided to decr the Glimep1mg  to 1/2 tab Qam. 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  IBS & COLON POLYPS >> followed by [facility], [facility], [facility] but we don't have notes from him... ~  Colonoscopy 8/10 showed one 8mm sessile polyp in asc colon> bx= polypoid colonic mucosa w/ benign lymphoid aggregates...  Hx of ADENOCARCINOMA, BREAST (ICD-174.9) - right breast cancer diagnosed in 2000 w/ stage IIA disease... right lumpectomy & node bx by DrSreck, XRT by DrGoodchild, & CMF chemotherapy by DrSherrill... no known recurrence in f/u and "released" by DrSherrill. ~  f/u Mammograms at [facility] have all been neg... ~  5/14: right breast ca diagnosed 2000- surg, XRT, ChemoRx by DrSherrill; no known recurrence & she is followed by her GYN now.  OSTEOPOROSIS (ICD-733.00) - prev on Actonel 35mg /wk, calcium, vitamins, Vit D 2000 u daily (off Actonel now per DrLomax)... ~  BMD by DrLomax 2007 showed TScores -2.1 in Spine, & -1.7 in [facility]... ~  BMD by DrLomax in 2009- we don't have these results... ~  BMD by DrLomax 7/11 showed TScores -2.0 Spine, & -2.2 FemNeck ~  BMD 2013> we don't have copy but pt reports that it was stable...  ANXIETY (ICD-300.00) - on ALPRAZOLAM 0.5mg  Prn...  Health Maintenance - takes ASA 81mg /d... GYN= DrLomax for PAP, Mammograms [facility]), and BMD's... had PNEUMOVAX 2000... TDAP given 10/13... can't get flu shots w/ allergy to eggs...   Past Surgical History  Procedure Laterality Date  . Surgery on neck for removal of birthmark    . Right breast cancer sugery  1995  . Distal pancreatectomy and splenectomy for mucinous cystadenoma of pancreas  2000    Outpatient Encounter Prescriptions as of 06/23/2013  Medication Sig Dispense Refill  . albuterol (PROVENTIL HFA;VENTOLIN HFA) 108 (90 BASE) MCG/ACT inhaler Inhale 2 puffs into the lungs every 6 (six) hours as needed.  1 Inhaler  6  . ALPRAZolam (XANAX) 0.5 MG tablet TAKE ONE TABLET BY MOUTH THREE TIMES DAILY AS NEEDED FOR SLEEP OR ANXIETY  90 tablet  0  . amLODipine  (NORVASC) 5 MG tablet Take 1 tablet (5 mg total) by mouth daily.  30 tablet  11  . aspirin 81 MG tablet Take 81 mg by mouth daily.        Marland Kitchen atorvastatin (LIPITOR) 10 MG tablet Take 1 tablet by mouth once a day  90 tablet  3  . BD ULTRA-FINE LANCETS lancets Use as instructed  100 each  12  . Blood Glucose Monitoring Suppl (ACCU-CHEK AVIVA PLUS) W/DEVICE KIT 1 kit by Does not apply route once.  1 kit  0  . Cholecalciferol (VITAMIN D) 2000 UNITS CAPS Take 1 capsule by mouth daily.        . fluocinonide-emollient (LIDEX-E) 0.05 % cream Apply topically 2 (two) times daily as needed.  30 g  5  . Fluticasone-Salmeterol (ADVAIR DISKUS) 250-50 MCG/DOSE AEPB Inhale 1 puff into the lungs 2 (two) times daily.  3 each  3  . glimepiride (AMARYL) 1 MG tablet Take 1/2 tablet by mouth every morning  45 tablet  3  . glucose blood (ACCU-CHEK AVIVA PLUS) test strip TEST ONCE DAILY,  DX  250.00  100 each  12  . metFORMIN (GLUCOPHAGE-XR) 500 MG 24 hr tablet Take one tablet by mouth two times daily  180 tablet  3  . montelukast (SINGULAIR) 10 MG tablet Take 1 tablet (10 mg total) by mouth at bedtime.  90 tablet  3  . sitaGLIPtin (JANUVIA) 100 MG tablet Take  1 tablet (100 mg total) by mouth daily.  90 tablet  3   No facility-administered encounter medications on file as of 06/23/2013.    Allergies  Allergen Reactions  . Other     Flu vaccine due to egg whites that she was allergic to as a child    Current Medications, Allergies, Past Medical History, Past Surgical History, Family History, and Social History were reviewed in [employer] record.    Review of Systems        The patient complains of dyspnea on exertion.  The patient denies fever, chills, sweats, anorexia, fatigue, weakness, malaise, weight loss, sleep disorder, blurring, diplopia, eye irritation, eye discharge, vision loss, eye pain, photophobia, earache, ear discharge, tinnitus, decreased hearing, nasal congestion,  nosebleeds, sore throat, hoarseness, chest pain, palpitations, syncope, orthopnea, PND, peripheral edema, cough, dyspnea at rest, excessive sputum, hemoptysis, wheezing, pleurisy, nausea, vomiting, diarrhea, constipation, change in bowel habits, abdominal pain, melena, hematochezia, jaundice, gas/bloating, indigestion/heartburn, dysphagia, odynophagia, dysuria, hematuria, urinary frequency, urinary hesitancy, nocturia, incontinence, back pain, joint pain, joint swelling, muscle cramps, muscle weakness, stiffness, arthritis, sciatica, restless legs, leg pain at night, leg pain with exertion, rash, itching, dryness, suspicious lesions, paralysis, paresthesias, seizures, tremors, vertigo, transient blindness, frequent falls, frequent headaches, difficulty walking, depression, anxiety, memory loss, confusion, cold intolerance, heat intolerance, polydipsia, polyphagia, polyuria, unusual weight change, abnormal bruising, bleeding, enlarged lymph nodes, urticaria, allergic rash, hay fever, and recurrent infections.   Objective:   Physical Exam     WD, WN, 57 y/o WF in NAD... GENERAL:  Alert & oriented; pleasant & cooperative... HEENT:  [place]/AT, EOM-wnl, PERRLA, EACs-clear, TMs-wnl, NOSE-clear, THROAT-clear & wnl. NECK:  Supple w/ full ROM; no JVD; normal carotid impulses w/o bruits; no thyromegaly or nodules palpated; no lymphadenopathy. Scar on neck from remote birthmark removal... CHEST:  Clear to P & A; without wheezes/ rales/ or rhonchi. Right breast scar from lumpectomy... HEART:  Regular Rhythm; without murmurs/ rubs/ or gallops. ABDOMEN:  Scar of surg, soft & nontender; normal bowel sounds; no organomegaly or masses detected. EXT: without deformities or arthritic changes; no varicose veins/ venous insuffic/ or edema. NEURO:  CN's intact; motor testing normal; sensory testing normal; gait normal & balance OK. DERM:  No lesions noted; no rash etc...  RADIOLOGY DATA:  Reviewed in the EPIC EMR &  discussed w/ the patient...  LABORATORY DATA:  Reviewed in the EPIC EMR & discussed w/ the patient...   Assessment & Plan:    AR & ASTHMA> stable on Advair, Proair rescue & Singulair w/o recent exac...  HBP>  Controlled on diet alone, & improved w/ wt loss...  CHOL>  Stable on Lip10 + diet...  DM>  Stable on MetformBid, Glimep1mg - 1/2 tab Qam, and Januvia100 ...  GI> Prob IBS & Polyps>  Followed by [id number] & up to date on colon screen...  Hx Breast Cancer>  No known recurrence, see details above, she gets yearly f/u mammograms etc...  Osteopenia>  Prev on Actonel but now drug holiday per [facility]; he does her BMDs & we reviewed these reportsw...  Other medical problems as noted...   Patient's Medications  New Prescriptions   LOSARTAN (COZAAR) 50 MG TABLET    Take 1 tablet (50 mg total) by mouth daily.  Previous Medications   ALBUTEROL (PROVENTIL HFA;VENTOLIN HFA) 108 (90 BASE) MCG/ACT INHALER    Inhale 2 puffs into the lungs every 6 (six) hours as needed.   ALPRAZOLAM (XANAX) 0.5 MG TABLET    TAKE  ONE TABLET BY MOUTH THREE TIMES DAILY AS NEEDED FOR SLEEP OR ANXIETY   ASPIRIN 81 MG TABLET    Take 81 mg by mouth daily.     ATORVASTATIN (LIPITOR) 10 MG TABLET    Take 1 tablet by mouth once a day   BD ULTRA-FINE LANCETS LANCETS    Use as instructed   BLOOD GLUCOSE MONITORING SUPPL (ACCU-CHEK AVIVA PLUS) W/DEVICE KIT    1 kit by Does not apply route once.   CHOLECALCIFEROL (VITAMIN D) 2000 UNITS CAPS    Take 1 capsule by mouth daily.     FLUOCINONIDE-EMOLLIENT (LIDEX-E) 0.05 % CREAM    Apply topically 2 (two) times daily as needed.   FLUTICASONE-SALMETEROL (ADVAIR DISKUS) 250-50 MCG/DOSE AEPB    Inhale 1 puff into the lungs 2 (two) times daily.   GLIMEPIRIDE (AMARYL) 1 MG TABLET    Take 1/2 tablet by mouth every morning   GLUCOSE BLOOD (ACCU-CHEK AVIVA PLUS) TEST STRIP    TEST ONCE DAILY,  DX  250.00   METFORMIN (GLUCOPHAGE-XR) 500 MG 24 HR TABLET    Take one tablet by mouth two  times daily   MONTELUKAST (SINGULAIR) 10 MG TABLET    Take 1 tablet (10 mg total) by mouth at bedtime.   SITAGLIPTIN (JANUVIA) 100 MG TABLET    Take 1 tablet (100 mg total) by mouth daily.  Modified Medications   No medications on file  Discontinued Medications   AMLODIPINE (NORVASC) 5 MG TABLET    Take 1 tablet (5 mg total) by mouth daily.

## 2013-06-28 ENCOUNTER — Telehealth: Payer: Self-pay | Admitting: *Deleted

## 2013-06-28 MED ORDER — ATORVASTATIN CALCIUM 10 MG PO TABS: ORAL_TABLET | ORAL | Status: DC

## 2013-06-28 MED ORDER — ALBUTEROL SULFATE HFA 108 (90 BASE) MCG/ACT IN AERS: 2.0000 | INHALATION_SPRAY | Freq: Four times a day (QID) | RESPIRATORY_TRACT | Status: DC | PRN

## 2013-06-28 MED ORDER — LOSARTAN POTASSIUM 50 MG PO TABS: 50.0000 mg | ORAL_TABLET | Freq: Every day | ORAL | Status: DC

## 2013-06-28 MED ORDER — GLIMEPIRIDE 1 MG PO TABS: ORAL_TABLET | ORAL | Status: DC

## 2013-06-28 MED ORDER — MONTELUKAST SODIUM 10 MG PO TABS: 10.0000 mg | ORAL_TABLET | Freq: Every day | ORAL | Status: DC

## 2013-06-28 MED ORDER — SITAGLIPTIN PHOSPHATE 100 MG PO TABS: 100.0000 mg | ORAL_TABLET | Freq: Every day | ORAL | Status: DC

## 2013-06-28 MED ORDER — METFORMIN HCL ER 500 MG PO TB24: ORAL_TABLET | ORAL | Status: DC

## 2013-06-28 NOTE — Telephone Encounter (Signed)
Called and spoke with pt and she is aware of lab results and pt is aware.  Refills have been sent in to the pharmacy and she is aware.

## 2013-06-28 NOTE — Telephone Encounter (Signed)
Pt returned [name]'s call & can be reached at [phone].  Pt's mail order pharmacy is [facility].  Amber Washington

## 2013-06-28 NOTE — Telephone Encounter (Signed)
Called to review lab results with pt.  i also need to find out which mail order she would like her meds to go to.

## 2013-07-18 ENCOUNTER — Ambulatory Visit: Payer: BC Managed Care – PPO | Admitting: Pulmonary Disease

## 2013-07-19 ENCOUNTER — Ambulatory Visit (INDEPENDENT_AMBULATORY_CARE_PROVIDER_SITE_OTHER)
Admission: RE | Admit: 2013-07-19 | Discharge: 2013-07-19 | Disposition: A | Discharge status: Home / Self Care | Payer: BC Managed Care – PPO | Source: Ambulatory Visit | Attending: Pulmonary Disease | Admitting: Pulmonary Disease

## 2013-07-19 ENCOUNTER — Encounter: Payer: Self-pay | Admitting: Pulmonary Disease

## 2013-07-19 ENCOUNTER — Ambulatory Visit (INDEPENDENT_AMBULATORY_CARE_PROVIDER_SITE_OTHER): Payer: BC Managed Care – PPO | Admitting: Pulmonary Disease

## 2013-07-19 VITALS — BP 120/70 | HR 77 | Temp 98.9°F | Ht 69.0 in | Wt 167.0 lb

## 2013-07-19 DIAGNOSIS — E7800 Pure hypercholesterolemia, unspecified: Secondary | ICD-10-CM

## 2013-07-19 DIAGNOSIS — J069 Acute upper respiratory infection, unspecified: Secondary | ICD-10-CM | POA: Insufficient documentation

## 2013-07-19 DIAGNOSIS — J45909 Unspecified asthma, uncomplicated: Secondary | ICD-10-CM

## 2013-07-19 DIAGNOSIS — E119 Type 2 diabetes mellitus without complications: Secondary | ICD-10-CM

## 2013-07-19 DIAGNOSIS — I10 Essential (primary) hypertension: Secondary | ICD-10-CM

## 2013-07-19 MED ORDER — PREDNISONE (PAK) 5 MG PO TABS: ORAL_TABLET | ORAL | Status: DC

## 2013-07-19 MED ORDER — HYDROCOD POLST-CHLORPHEN POLST 10-8 MG/5ML PO LQCR: 5.0000 mL | Freq: Two times a day (BID) | ORAL | Status: DC | PRN

## 2013-07-19 MED ORDER — AZITHROMYCIN 250 MG PO TABS: ORAL_TABLET | ORAL | Status: DC

## 2013-07-19 MED ORDER — METHYLPREDNISOLONE ACETATE 80 MG/ML IJ SUSP
80.0000 mg | Freq: Once | INTRAMUSCULAR | Status: AC
  Administered 2013-07-19: 80 mg via INTRAMUSCULAR

## 2013-07-19 NOTE — Patient Instructions (Signed)
Today we updated your med list in our EPIC system...    Continue your current medications the same...  For the upper resp infection & cough>>    We did a follow up CXR to be sure all is clear (we will call you w/ the result)...    We gave you a Depo shot for the airway inflammation...    Take the ZPak antibiotic as directed...    We wrote for a Prednisone dosepak to start tomorrow AM...    Finally you may use the TUSSIONEX cough syrup every 12h as needed...  Continue the Advair, Singulair, & Mucinex as you are doing...  Call for any questions.Marland KitchenMarland Kitchen

## 2013-08-22 ENCOUNTER — Other Ambulatory Visit: Payer: Self-pay | Admitting: Pulmonary Disease

## 2013-08-24 ENCOUNTER — Other Ambulatory Visit: Payer: Self-pay | Admitting: Pulmonary Disease

## 2013-08-24 ENCOUNTER — Telehealth: Payer: Self-pay | Admitting: Pulmonary Disease

## 2013-08-24 MED ORDER — ALPRAZOLAM 0.5 MG PO TABS: ORAL_TABLET | ORAL | Status: DC

## 2013-08-24 NOTE — Telephone Encounter (Signed)
rx for the alprazolam has been called to the pharmacy.   Pt is aware and nothing further is needed.

## 2013-09-22 NOTE — Progress Notes (Signed)
Subjective:    Patient ID: Amber Washington, female    DOB: Sep 20, 1955, 58 y.o.   MRN: [id number]  HPI 58 y/o WF here for a follow up visit... she has multiple medical problems as noted below...   ~  January 06, 2012:  50moROV & VAstinhas noted incr asthma symptoms this spring- watery eyes, sneezing, congestion, SOB, "tight",sl wheezy, etc; taking her Singulair daily & using the Proair daily;  PFTs below w/ mild to mod airflow obstruction & we discusse4d need for controller med> Depo80/ Dosepak/ SYMBICORT250Bid...  otherw reports stable & we reviewed meds below> on all3 DM meds w/ FBS=126, A1c=6.7 Improved...  See prob list>> PFTs 4/13:  FVC=3.27 (85%), FEV1=2.14 (70%), %1sec=65, mid-flows= 40% predicted... LABS 4/13:  BS=126, A1c=6.7, other chems= ok...  ~  July 07, 2102:  637moOV & ViBrannonas had a good interval- but c/o being tired, caring for new grandbaby (daugh lives w/ her); notes flair of eczema on elbows & we discussed Rx w/ LidexE cream... We reviewed the following medical problems during today's office visit>>     AR, Asthma> on Singulair10, Advair250 (just once/d per pt), ProairHFA prn; she had resp exac 4/13 w/ Advair & Dosepak Rx> improved.    BorderlineHBP> on ASA81; BP= 122/80 & she denies CP, palpit, SOB, edema, etc...    Chol> on Lip10; FLP showed TChol 145, TG 94, HDL 46, LDL 80    DM> on Metop500Bid, Glimep1m88mJanuvia100; FBS= 143, A1c= 7.0; she is rec to get on better diet, exercise, etc...    GI- IBS, polyps> off prev Bentyl; [facility] was neg x one benign polyp, no adenomatous changes...    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    Hx breast cancer> right breast ca diagnosed 2000- surg, XRT, ChemoRx by DrSherrill; no known recurrence & she is followed by DrLOsvaldo Shipperw...    Osteopenia> off calcium, on VitD2000; she gets her BMDs from DrLomax, last 7/11 [facility]/ [facility]).    Anxiety> on Xanax0.5; she notes that DauRaviagrandbaby have moved in w/ them... We reviewed prob list, meds, xrays and labs> see below for updates >> she refuses Flu shot, OK TDAP today...  ~  Jan 07, 2013:  44mo38mo & CPX> ViviMarchetta had a good interval overall and has lost 10# on diet & exercise program;  She reports 2 recent hypoglycemic episodes that occurred in the middle of the night & responded to oral intake;  We decided to DECREASE he Glimep1mg 42m1/2 tab Qam & she will continue to monitor...     AR, Asthma> on Singulair10, [id number] once/d per pt), ProairHFA prn; she denies resp infections or asthma exac; no cough, sput, SOB, wheezing, etc...    BorderlineHBP> on ASA81; BP= 126/78 & she denies CP, palpit, SOB, edema, etc...    Chol> on Lip10; FLP shows TChol 130, TG 118, HDL 41, LDL 66    DM> on Metop500Bid, Glimep1mg, 69muvia100; FBS= 118, A1c= 6.5; she has lost 10# down to 165# & had 2 recent hypoglycemic episodes; we decided to decr the Glimep1mg to68m2 tab Qam...    GI- IBS, polyps> [facility] was neg x one benign polyp, no adenomatous changes...    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    Hx breast cancer> right breast ca diagnosed 2000- surg, XRT, ChemoRx by DrSherrill; no known recurrence & she is followed by her GYN now (mammogram 8/13- neg)...    Osteopenia> off calcium, on VitD2000; she gets her BMDs from her GYN, report from 7/11 w/ TScore -2.2 in [facility] (she reports f/u 2013 was stable).    Anxiety> on Xanax0.5; she notes that [place] & grandbaby have moved in w/ them... We reviewed prob list, meds, xrays and labs> see below for updates >> she declines the seasonal Flu vaccine (allegy to eggs);  Depression screen is neg... LABS 4/14:  FLP- at goals on Lip10;  Chems- ok w/ BS=118 A1c=6.5;  CBC- wnl;  TSH=1.17;   VitD=58;  Umicroalb was neg 10/13... EKG 5/14 showed NSR, rate68, WNL, NAD...   ~  June 23, 2013:  5-19moROV & add-on appt> VRishitadescribes recent elev BP assoc w/ onset on neck pain, had chiropractic rx but BP remained elev even when pain resolved; we called in Amlod5 but states feeling tired on this med & wants to switch; we [facility] monitor BP at home...     AR, Asthma> on Singulair10, [id number](just once/d per pt), ProairHFA prn; she denies resp infections or asthma exac; no cough, sput, SOB, wheezing, etc...    BorderlineHBP> on ASA81 & Amlod5; BP= 112/68 & she denies CP, palpit, SOB, edema, etc; but she is tired/ fatigued & wants to stop the Amlod & [facility]    Chol> on Lip10; FLP 4/14 shows TChol 130, TG 118, HDL 41, LDL 66    DM> on Metop500Bid, Glimep144m1/2, Januvia100; FBS= 97, A1c= 7.1; wt is stable at 166# w/o further hypoglycemic episodes...    GI- IBS, polyps> [facility]10 was neg x one benign polyp, no adenomatous changes...    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    Hx breast cancer> right breast ca diagnosed 2000- surg, XRT, ChemoRx by DrSherrill; no known recurrence & she is followed by her GYN now (mammogram 8/13- neg)...    Osteopenia> off calcium, on VitD2000; she gets her BMDs from her GYN, report from 7/11 w/ [facility] f/u 2013 was stable).    Anxiety> on Xanax0.5; she notes that DaLangdon grandbaby have moved in w/ them... We reviewed prob list, meds, xrays and labs> see below for updates >> She declines the 2014 Flu vaccine... LABS 10/14:  Chems- ok w/ BS=97, A1c=7.1...Marland Kitchen ~  July 19, 2013:  64m864moV & add-on appt requested for URI symptoms> VivTemimaticed the onset of URI "cold" symptoms over the last 1-2wks w/ head congestion, stopped up, dry cough, chest tightness/ aching/ sore, w/ onset some  wheezing/ SOB; she's had assoc cills but no fever or sweats;  Exam shows nasal congestion, pharyngeal erythema w/o exud/ drainage, few scat rhonchi & end-exp wheezing;  CXR shows norm heart size, clear lungs x min scarring at left base and surg clips in the right axillary area & LUQ of abd, NAD;  We decided to treat this AB exac w/ ZPak, Depo80, Pred taper, Tussionex, and continue the AdvRFFMBW466ingulair10, AlbutHFA rescue inhaler, & Mucinex/ Fluids/ etc...           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  ALLERGIC RHINITIS (ICD-477.9) - life-long hx of allergic rhinitis and asthma, with eczema as well... allergy testing in 80's w/ markedly pos reactions to trees, grasses, molds, dust, dogs, cats, feathers, shellfish, eggs & other foods including wheat, spinach, beans... ? if IgE / Rast checked- we don't have Vol I of her old chart avail... prev nasal polyp surgery in 1980's by DrKraus... ~  4/13: she notes incr allergy symptoms this spring> on Singulair daily & advised OTC Antihist, Saline nasal mist, etc...  ASTHMA (ICD-493.90) - hx of asthma and allergies well controlled on SINGULAIR 31mQod & PROAIR HFA Prn... very active, exercising some & no problems w/ breathing... ~  11/10: f/u CXR clear... we discussed trying to wean the Singulair slowly... ~  11/11: states she's using the Singulair Qod & hasn't needed the Proair rescue. ~  CXR 10/12 showed norm heart size, clear lungs, surg clips right axilla & left upper quad, NAD... ~  4/13:  She notes incr Asthma symptoms w/ tightness, congestion, some wheezing; Rx w/ Depo/ Dosepak & ADVAIR250-Bid... ~  PFTs 4/13:  FVC=3.27 (85%), FEV1=2.14 (70%), %1sec=65, mid-flows= 40% predicted... ~  5/14 & 10/14:  on Singulair10, Advair250 (just once/d per pt), ProairHFA prn; she denies resp infections or asthma exac; no cough, sput, SOB, wheezing,  etc ~  11/14: she presented w/ an AB exac> onset of URI "cold" symptoms w/ head congestion, dry cough, chest tightness/ aching/ sore, w/ onset some wheezing/ SOB; Exam shows few scat rhonchi & end-exp wheezing;  CXR shows norm heart size, clear lungs x min scarring at left base and surg clips in the right axillary area & LUQ of abd, NAD;  We decided to treat this AB exac w/ ZPak, Depo80, Pred taper, Tussionex, and continue all the other meds.  HYPERTENSION, BORDERLINE (ICD-401.9) - on diet alone... ~  11/11:  BP= 122/84 (wt=160#) & she denies HA, fatigue, visual changes, CP, palipit, dizziness, syncope, dyspnea, edema, etc...  ~  10/12:  BP= 130/86 (wt=169#) & she remains asymptomatic... ~  4/13:  BP= 128/80 (wt=172#) & she denies HA, CP, palpit, edema, etc... ~  10/13: on ASA81 + diet; BP= 122/80 & she denies CP, palpit, SOB, edema, etc... ~  5/14: on ASA81; BP= 126/78 after 10# wt loss & she denies CP, palpit, SOB, edema, etc ~  10/14: she called w/ BP elev from neck pain, anxiety; Amlodip5 called in & BP improved to 112/68... ~  10/14: on ASA81 & Amlod5; BP= 112/68 & she denies CP, palpit, SOB, edema, etc; but she is tired/ fatigued & wants to stop the Amlod & [facility] ~  11/14: on ASA81 & Losar50; BP= 120/70  HYPERCHOLESTEROLEMIA (ICD-272.0) - on LIPITOR 113mdaily... takes med regularly & tolerates well...  ~  [place] showed TChol 132, TG 52, HDL 50, LDL 72... rec- continue same med. ~  [place]/09 showed TChol 137, TG 71, HDL 46, LDL 76... rec- continue same. ~  FL[place]1/10 showed Tchol 145, TG 84, HDL 49, LDL 79 ~  FLP 5/11 on Lip10 showed TChol 153, TG 52, HDL 52, LDL 91 ~  FLP 10/12 on Lip10 showed TChol 153, TG 76, HDL 53, LDL 85 ~  FLP 10/13 on Lip10 showed TChol 145, TG 94, HDL 46, LDL 80 ~  FLP 4/14 on Lip10 showed TChol 130, TG 118, HDL 41, LDL 66   DIABETES MELLITUS (ICD-250.00) - started after her partial pancreatectomy  in 2000... on METFORMIN ER 566m- 2Tabs/d,  & JANUVIA  1030md... BS at home are 100-120 range... NOTE: she had SPLENECTOMY at the same time. ~  labs 9/08 showed BS= 125,  HgA1c= 6.6...Marland KitchenMarland Kitchenec- same meds + diet. ~  Ophthalmology= DrHecker 9/09 no diabetic retinopathy... ~  labs 10/09 showed BS= 163,  HgA1c= 6.8...Marland Kitchennot as good w/ weight gain, rec- same med + diet. ~  labs 11/10 showed BS= 97, A1c= 7.0...Marland KitchenMarland Kitcheneminder to avoid sweets etc... ~  labs in 2011 showed BS= 120-140, A1c= 7.1-7.4 on MetformER500-2Qam + Januv100/d. ~  9/12:  Neg Ophthalmology eval by DrHecker, no retinopathy... ~  Labs 10/12 showed BS= 156, A1c= 7.6... We decided to add GLIMEPIRIDE 82m782mm + diet & get wt back down. ~  Labs 4/13 on all 3me32mshowed FBS=126, A1c=6.7... IMarland KitchenMarland Kitchenroved, continue same... ~  Eye eval 9/13 from DrHecker was neg w/o retinopathy or macular edema... ~  Labs 10/13 on on Metop500Bid, Glimep82mg,48mnuvia100; FBS= 143, A1c= 7.0; needs better diet, same meds for now. ~  5/14: on Metop500Bid, Glimep82mg, 60muvia100; FBS= 118, A1c= 6.5; she has lost 10# down to 165# & had 2 recent hypoglycemic episodes; we decided to decr the Glimep82mg to13m2 tab Qam. 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  IBS & COLON POLYPS >> [facility]... ~  Colonoscopy 8/10 showed one 8mm sess34m polyp in asc colon> bx= polypoid colonic mucosa w/ benign lymphoid aggregates...  Hx of ADENOCARCINOMA, BREAST (ICD-174.9) - right breast cancer diagnosed in 2000 w/ stage IIA disease... right lumpectomy & node bx by DrSreck, XRT by DrGoodchild, & CMF chemotherapy by DrSherrill... no known recurrence in f/u and "released" by DrSherrill. ~  [facility]... ~  5/14: right breast ca diagnosed 2000- surg, XRT, ChemoRx by DrSherrill; no known recurrence & she is followed by her GYN now.  OSTEOPOROSIS (ICD-733.00) - prev on Actonel 35mg/wk, 85mium, vitamins, Vit D 2000 u daily (off Actonel now per DrLomax)... ~  BMD by DrLomax 2007 showed TScores -2.1 in Spine, & -1.7 in FemNeck...[facility] by DrLomax in 2009- we don't have these results... ~  BMD by DrLomax 7/11 showed TScores -2.0 Spine, & -2.2 FemNeck ~  BMD 2013> we don't have copy but pt reports that it was stable...  ANXIETY (ICD-300.00) - on ALPRAZOLAM 0.5mg Prn...6mealth Maintenance - takes ASA 882mg/d... G20mDrLomax for PAP, [facility]amm[facility]... had PNEUMOVAX 2000... TDAP given 10/13... can't get flu shots w/ allergy to eggs...   Past Surgical History  Procedure Laterality Date  . Surgery on neck for removal of birthmark    . Right breast cancer sugery  1995  . Distal pancreatectomy and splenectomy for mucinous cystadenoma of pancreas  2000    Outpatient Encounter Prescriptions as of 07/19/2013  Medication Sig  . albuterol (PROVENTIL HFA;VENTOLIN HFA) 108 (90 BASE) MCG/ACT inhaler Inhale 2 puffs into the lungs every 6 (six) hours as needed.  . aspirin 81Marland KitchenMG tablet Take 81 mg by mouth daily.    . atorvastatMarland Kitchenn (LIPITOR) 10 MG tablet Take 1 tablet by mouth once a day  . BD ULTRA-FINE LANCETS  lancets Use as instructed  . Blood Glucose Monitoring Suppl (ACCU-CHEK AVIVA PLUS) W/DEVICE KIT 1 kit by Does not apply route once.  . Cholecalciferol (VITAMIN D) 2000 UNITS CAPS Take 1 capsule by mouth daily.    . fluocinonide-emollient (LIDEX-E) 0.05 % cream Apply topically 2 (two) times daily as needed.  . Fluticasone-Salmeterol (ADVAIR DISKUS) 250-50 MCG/DOSE AEPB Inhale 1 puff into the lungs 2 (two) times daily.  Marland Kitchen glimepiride (AMARYL) 1 MG tablet Take 1/2 tablet by mouth every morning  . glucose blood (ACCU-CHEK AVIVA PLUS) test strip TEST ONCE DAILY,  DX  250.00   . losartan (COZAAR) 50 MG tablet Take 1 tablet (50 mg total) by mouth daily.  . metFORMIN (GLUCOPHAGE-XR) 500 MG 24 hr tablet Take one tablet by mouth two times daily  . montelukast (SINGULAIR) 10 MG tablet Take 1 tablet (10 mg total) by mouth at bedtime.  . sitaGLIPtin (JANUVIA) 100 MG tablet Take 1 tablet (100 mg total) by mouth daily.  . [DISCONTINUED] ALPRAZolam (XANAX) 0.5 MG tablet TAKE ONE TABLET BY MOUTH THREE TIMES DAILY AS NEEDED FOR SLEEP OR ANXIETY  . azithromycin (ZITHROMAX) 250 MG tablet Take as directed  . chlorpheniramine-HYDROcodone (TUSSIONEX PENNKINETIC ER) 10-8 MG/5ML LQCR Take 5 mLs by mouth every 12 (twelve) hours as needed for cough.  . predniSONE (STERAPRED UNI-PAK) 5 MG TABS tablet Take as directed  . [EXPIRED] methylPREDNISolone acetate (DEPO-MEDROL) injection 80 mg     Allergies  Allergen Reactions  . Other     Flu vaccine due to egg whites that she was allergic to as a child    Current Medications, Allergies, Past Medical History, Past Surgical History, Family History, and Social History were reviewed in [employer] record.    Review of Systems        The patient complains of dyspnea on exertion.  The patient denies fever, chills, sweats, anorexia, fatigue, weakness, malaise, weight loss, sleep disorder, blurring, diplopia, eye irritation, eye discharge, vision loss, eye pain, photophobia, earache, ear discharge, tinnitus, decreased hearing, nasal congestion, nosebleeds, sore throat, hoarseness, chest pain, palpitations, syncope, orthopnea, PND, peripheral edema, cough, dyspnea at rest, excessive sputum, hemoptysis, wheezing, pleurisy, nausea, vomiting, diarrhea, constipation, change in bowel habits, abdominal pain, melena, hematochezia, jaundice, gas/bloating, indigestion/heartburn, dysphagia, odynophagia, dysuria, hematuria, urinary frequency, urinary hesitancy, nocturia, incontinence, back pain, joint pain, joint swelling, muscle cramps,  muscle weakness, stiffness, arthritis, sciatica, restless legs, leg pain at night, leg pain with exertion, rash, itching, dryness, suspicious lesions, paralysis, paresthesias, seizures, tremors, vertigo, transient blindness, frequent falls, frequent headaches, difficulty walking, depression, anxiety, memory loss, confusion, cold intolerance, heat intolerance, polydipsia, polyphagia, polyuria, unusual weight change, abnormal bruising, bleeding, enlarged lymph nodes, urticaria, allergic rash, hay fever, and recurrent infections.   Objective:   Physical Exam     WD, WN, 58 y/o WF in NAD... GENERAL:  Alert & oriented; pleasant & cooperative... HEENT:  [place]/AT, EOM-wnl, PERRLA, EACs-clear, TMs-wnl, NOSE-clear, THROAT-clear & wnl. NECK:  Supple w/ full ROM; no JVD; normal carotid impulses w/o bruits; no thyromegaly or nodules palpated; no lymphadenopathy. Scar on neck from remote birthmark removal... CHEST:  Clear to P & A; without wheezes/ rales/ or rhonchi. Right breast scar from lumpectomy... HEART:  Regular Rhythm; without murmurs/ rubs/ or gallops. ABDOMEN:  Scar of surg, soft & nontender; normal bowel sounds; no organomegaly or masses detected. EXT: without deformities or arthritic changes; no varicose veins/ venous insuffic/ or edema. NEURO:  CN's intact; motor testing normal; sensory testing normal; gait normal &  balance OK. DERM:  No lesions noted; no rash etc...  RADIOLOGY DATA:  Reviewed in the EPIC EMR & discussed w/ the patient...  LABORATORY DATA:  Reviewed in the EPIC EMR & discussed w/ the patient...   Assessment & Plan:    AR & ASTHMA> she has a URI ansd AB exac> treat w/ Depo80, Pred taper, ZPak, and continue her Advair, Singulair, AlbutHFA, Mucinex, etc...  HBP>  Controlled on diet alone, & improved w/ wt loss...  CHOL>  Stable on Lip10 + diet...  DM>  Stable on MetformBid, Glimep4m- 1/2 tab Qam, and Januvia100 ...  GI> Prob IBS & Polyps>  Followed by DLTRVUYEB& up to  date on colon screen...  Hx Breast Cancer>  No known recurrence, see details above, she gets yearly f/u mammograms etc...  Osteopenia>  Prev on Actonel but [facility] & we reviewed these reportsw...  Other medical problems as noted...   Patient's Medications  New Prescriptions   AZITHROMYCIN (ZITHROMAX) 250 MG TABLET    Take as directed   CHLORPHENIRAMINE-HYDROCODONE (TUSSIONEX PENNKINETIC ER) 10-8 MG/5ML LQCR    Take 5 mLs by mouth every 12 (twelve) hours as needed for cough.   PREDNISONE (STERAPRED UNI-PAK) 5 MG TABS TABLET    Take as directed  Previous Medications   ALBUTEROL (PROVENTIL HFA;VENTOLIN HFA) 108 (90 BASE) MCG/ACT INHALER    Inhale 2 puffs into the lungs every 6 (six) hours as needed.   ASPIRIN 81 MG TABLET    Take 81 mg by mouth daily.     ATORVASTATIN (LIPITOR) 10 MG TABLET    Take 1 tablet by mouth once a day   BD ULTRA-FINE LANCETS LANCETS    Use as instructed   BLOOD GLUCOSE MONITORING SUPPL (ACCU-CHEK AVIVA PLUS) W/DEVICE KIT    1 kit by Does not apply route once.   CHOLECALCIFEROL (VITAMIN D) 2000 UNITS CAPS    Take 1 capsule by mouth daily.     FLUOCINONIDE-EMOLLIENT (LIDEX-E) 0.05 % CREAM    Apply topically 2 (two) times daily as needed.   FLUTICASONE-SALMETEROL (ADVAIR DISKUS) 250-50 MCG/DOSE AEPB    Inhale 1 puff into the lungs 2 (two) times daily.   GLIMEPIRIDE (AMARYL) 1 MG TABLET    Take 1/2 tablet by mouth every morning   GLUCOSE BLOOD (ACCU-CHEK AVIVA PLUS) TEST STRIP    TEST ONCE DAILY,  DX  250.00   LOSARTAN (COZAAR) 50 MG TABLET    Take 1 tablet (50 mg total) by mouth daily.   METFORMIN (GLUCOPHAGE-XR) 500 MG 24 HR TABLET    Take one tablet by mouth two times daily   MONTELUKAST (SINGULAIR) 10 MG TABLET    Take 1 tablet (10 mg total) by mouth at bedtime.   SITAGLIPTIN (JANUVIA) 100 MG TABLET    Take 1 tablet (100 mg total) by mouth daily.  Modified Medications   Modified Medication Previous Medication   ALPRAZOLAM  (XANAX) 0.5 MG TABLET ALPRAZolam (XANAX) 0.5 MG tablet      TAKE ONE TABLET BY MOUTH THREE TIMES DAILY AS NEEDED FOR SLEEP OR ANXIETY    TAKE ONE TABLET BY MOUTH THREE TIMES DAILY AS NEEDED FOR SLEEP OR ANXIETY  Discontinued Medications   No medications on file

## 2013-10-20 ENCOUNTER — Telehealth: Payer: Self-pay | Admitting: Pulmonary Disease

## 2013-10-20 MED ORDER — METHYLPREDNISOLONE 4 MG PO KIT: PACK | ORAL | Status: DC

## 2013-10-20 NOTE — Telephone Encounter (Signed)
Per SN---  Was she exposed to the flu??  Rest Heat to chest Tylenol otc mucinex 600 mg  2 po bid Fluids Delsym 2 tsp bid for cough Does she need medrol dosepak?   If so, ok to call this in as well. thanks

## 2013-10-20 NOTE — Telephone Encounter (Signed)
Spoke with pt. Reports severe chest congestion, dry cough, chest pain when coughing and wheezing. Denies SOB. Onset was 4 days ago. Fever/chills and body aches were present but have resolved. Would like SN's recs.  Allergies  Allergen Reactions  . Other     Flu vaccine due to egg whites that she was allergic to as a child   SN - please advise. Thanks.

## 2013-10-20 NOTE — Telephone Encounter (Signed)
Spoke with the pt and notified of recs per SN She states was not exposed to flu that she knows of  She felt like she did need the medrol pack and so this was sent to [facility]

## 2013-11-02 ENCOUNTER — Telehealth: Payer: Self-pay | Admitting: Pulmonary Disease

## 2013-11-02 ENCOUNTER — Ambulatory Visit (INDEPENDENT_AMBULATORY_CARE_PROVIDER_SITE_OTHER): Payer: BC Managed Care – PPO | Admitting: Adult Health

## 2013-11-02 ENCOUNTER — Encounter: Payer: Self-pay | Admitting: Adult Health

## 2013-11-02 VITALS — BP 112/76 | HR 97 | Temp 97.8°F | Ht 69.0 in | Wt 171.2 lb

## 2013-11-02 DIAGNOSIS — K219 Gastro-esophageal reflux disease without esophagitis: Secondary | ICD-10-CM | POA: Insufficient documentation

## 2013-11-02 NOTE — Telephone Encounter (Signed)
Spoke with pt. She wanted to schedule an appt to be evaluated today. She reports she has not been feeling so well and last night she had terrible heartburn. Requested appt. She is scheduled to see TP this AM.

## 2013-11-02 NOTE — Patient Instructions (Signed)
Begin Prilosec 20mg  daily before meal x 1 month .  GERD diet  Allegra 180mg  daily for 1 week then As needed  For drainage.  Fluids and rest  Follow up As needed   Please contact office for sooner follow up if symptoms do not improve or worsen or seek emergency care  Dr. Lenna Gilford is retiring and we will need to set you up with a new Primary Care provider.  Please let us know if you would like [place] to assist with this referral.

## 2013-11-02 NOTE — Progress Notes (Signed)
Subjective:    Patient ID: Amber Washington, female    DOB: 1955/09/20, 58 y.o.   MRN: [id number]  HPI 58 y/o WF    11/02/2013 Acute OV  Complains of burning sensation in upper chest and throat.  1 episode of reflux into her throat. Few episodes on /off over last 3 days. Has taken Tums with some help.  Worse after eating . No chest pain or exertional chest pain or dyspnea. No dysphagia .  Had URI 3 weeks with cough, congestion, fever, aches of heartburn, chest tightness, fatigue x3 days. Called in steroid pack. Cough and congestion resolved.  Has drainage in throat.  No fever, no n/v/d.           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  ALLERGIC RHINITIS (ICD-477.9) - life-long hx of allergic rhinitis and asthma, with eczema as well... allergy testing in 80's w/ markedly pos reactions to trees, grasses, molds, dust, dogs, cats, feathers, shellfish, eggs & other foods including wheat, spinach, beans... ? if IgE / Rast checked- we don't have Vol I of her old chart avail... prev nasal polyp surgery in 1980's by DrKraus... ~  4/13: she notes incr allergy symptoms this spring> on Singulair daily & advised OTC Antihist, Saline nasal mist, etc...  ASTHMA (ICD-493.90) - hx of asthma and allergies well controlled on SINGULAIR 59mQod & PROAIR HFA Prn... very active, exercising some & no problems w/ breathing... ~  11/10: f/u CXR clear... we discussed trying to wean the Singulair slowly... ~  11/11: states she's using the Singulair Qod & hasn't needed the Proair rescue. ~  CXR 10/12 showed norm heart size, clear lungs, surg clips right axilla & left upper quad, NAD... ~  4/13:  She notes incr Asthma symptoms w/ tightness, congestion, some wheezing; Rx w/ Depo/ Dosepak & ADVAIR250-Bid... ~  PFTs 4/13:  FVC=3.27 (85%), FEV1=2.14 (70%), %1sec=65, mid-flows= 40% predicted... ~  5/14 &  10/14:  on Singulair10, Advair250 (just once/d per pt), ProairHFA prn; she denies resp infections or asthma exac; no cough, sput, SOB, wheezing, etc ~  11/14: she presented w/ an AB exac> onset of URI "cold" symptoms w/ head congestion, dry cough, chest tightness/ aching/ sore, w/ onset some wheezing/ SOB; Exam shows few scat rhonchi & end-exp wheezing;  CXR shows norm heart size, clear lungs x min scarring at left base and surg clips in the right axillary area & LUQ of abd, NAD;  We decided to treat this AB exac w/ ZPak, Depo80, Pred taper, Tussionex, and continue all the other meds.  HYPERTENSION, BORDERLINE (ICD-401.9) - on diet alone... ~  11/11:  BP= 122/84 (wt=160#) & she denies HA, fatigue, visual changes, CP, palipit, dizziness, syncope, dyspnea, edema, etc...  ~  10/12:  BP= 130/86 (wt=169#) & she remains asymptomatic... ~  4/13:  BP= 128/80 (wt=172#) & she denies HA, CP, palpit, edema, etc... ~  10/13: on ASA81 + diet; BP= 122/80 & she denies CP, palpit, SOB, edema, etc... ~  5/14: on ASA81; BP= 126/78 after 10# wt loss & she denies CP, palpit, SOB, edema, etc ~  10/14: she called w/ BP elev from neck pain, anxiety; Amlodip5 called in & BP improved to 112/68... ~  10/14: on ASA81 & Amlod5; BP= 112/68 & she denies CP, palpit, SOB, edema, etc; but she is tired/ fatigued & wants to stop the  Amlod & ch to [id number]. ~  11/14: on ASA81 & Losar50; BP= 120/70  HYPERCHOLESTEROLEMIA (ICD-272.0) - on LIPITOR 2m daily... takes med regularly & tolerates well...  ~  F[place]gram9/08 showed TChol 132, TG 52, HDL 50, LDL 72... rec- continue same med. ~  [place] showed TChol 137, TG 71, HDL 46, LDL 76... rec- continue same. ~  FAmargosa11/10 showed Tchol 145, TG 84, HDL 49, LDL 79 ~  FLP 5/11 on Lip10 showed TChol 153, TG 52, HDL 52, LDL 91 ~  FLP 10/12 on Lip10 showed TChol 153, TG 76, HDL 53, LDL 85 ~  FLP 10/13 on Lip10 showed TChol 145, TG 94, HDL 46, LDL 80 ~  FLP 4/14 on Lip10 showed TChol 130, TG 118,  HDL 41, LDL 66   DIABETES MELLITUS (ICD-250.00) - started after her partial pancreatectomy in 2000... on METFORMIN ER 5057m 2Tabs/d,  & JANUVIA 10019m... BS at home are 100-120 range... NOTE: she had SPLENECTOMY at the same time. ~  labs 9/08 showed BS= 125,  HgA1c= 6.6... Marland KitchenMarland Kitchenc- same meds + diet. ~  Ophthalmology= DrHecker 9/09 no diabetic retinopathy... ~  labs 10/09 showed BS= 163,  HgA1c= 6.8... Marland Kitchenot as good w/ weight gain, rec- same med + diet. ~  labs 11/10 showed BS= 97, A1c= 7.0... Marland KitchenMarland Kitchenminder to avoid sweets etc... ~  labs in 2011 showed BS= 120-140, A1c= 7.1-7.4 on MetformER500-2Qam + Januv100/d. ~  9/12:  Neg Ophthalmology eval by DrHecker, no retinopathy... ~  Labs 10/12 showed BS= 156, A1c= 7.6... We decided to add GLIMEPIRIDE 1mg62m + diet & get wt back down. ~  Labs 4/13 on all 3med74mhowed FBS=126, A1c=6.7... ImMarland KitchenMarland Kitchenoved, continue same... ~  Eye eval 9/13 from DrHecker was neg w/o retinopathy or macular edema... ~  Labs 10/13 on on Metop500Bid, Glimep1mg, 21muvia100; FBS= 143, A1c= 7.0; needs better diet, same meds for now. ~  5/14: on Metop500Bid, Glimep1mg, J83mvia100; FBS= 118, A1c= 6.5; she has lost 10# down to 165# & had 2 recent hypoglycemic episodes; we decided to decr the Glimep1mg to 66m tab Qam. 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  IBS & COLON POLYPS >> [facility]... ~  Colonoscopy 8/10 showed one 8mm sessi79mpolyp in asc colon> bx= polypoid colonic mucosa w/ benign lymphoid aggregates...  Hx of ADENOCARCINOMA, BREAST (ICD-174.9) - right breast cancer diagnosed in 2000 w/ stage IIA disease... right lumpectomy & node bx by DrSreck, XRT by DrGoodchild, &  CMF chemotherapy by DrSherrill... no known recurrence in f/u and "released" by DrSherrill. ~  [facility] ~  5/14: right breast ca diagnosed 2000- surg, XRT, ChemoRx by DrSherrill; no known recurrence & she is followed by her GYN now.  OSTEOPOROSIS (ICD-733.00) - prev on Actonel 35mg/wk, c13mum, vitamins, Vit D 2000 u daily (off Actonel now per DrLomax)... ~  BMD by DrLomax 2007 showed TScores -2.1 in Spine, & -1.7 in FemNeck... [facility] we don't have these results... ~  BMD by DrLomax 7/11 showed TScores -2.0 Spine, & -2.2 FemNeck ~  BMD 2013> we don't have copy but pt reports that it was stable...  ANXIETY (  ICD-300.00) - on ALPRAZOLAM 0.60m Prn...  Health Maintenance - takes ASA 834md... GYN= DrLomax for PAP, [facility] and BMD[facility]... had PNEUMOVAX 2000... TDAP given 10/13... can't get flu shots w/ allergy to eggs...   Past Surgical History  Procedure Laterality Date  . Surgery on neck for removal of birthmark    . Right breast cancer sugery  1995  . Distal pancreatectomy and splenectomy for mucinous cystadenoma of pancreas  2000    Outpatient Encounter Prescriptions as of 11/02/2013  Medication Sig  . albuterol (PROVENTIL HFA;VENTOLIN HFA) 108 (90 BASE) MCG/ACT inhaler Inhale 2 puffs into the lungs every 6 (six) hours as needed.  . ALPRAZolam (XANAX) 0.5 MG tablet TAKE ONE TABLET BY MOUTH THREE TIMES DAILY AS NEEDED FOR SLEEP OR ANXIETY  . aspirin 81 MG tablet Take 81 mg by mouth daily.    . Marland Kitchentorvastatin (LIPITOR) 10 MG tablet Take 1 tablet by mouth once a day  . BD ULTRA-FINE LANCETS lancets Use as instructed  . Blood Glucose Monitoring Suppl (ACCU-CHEK AVIVA PLUS) W/DEVICE KIT 1 kit by Does not apply route once.  . Cholecalciferol (VITAMIN D) 2000 UNITS CAPS Take 1 capsule by mouth daily.    . fluocinonide-emollient (LIDEX-E) 0.05 % cream Apply topically 2 (two) times daily as needed.  .  Fluticasone-Salmeterol (ADVAIR DISKUS) 250-50 MCG/DOSE AEPB Inhale 1 puff into the lungs 2 (two) times daily.  . Marland Kitchenlimepiride (AMARYL) 1 MG tablet Take 1/2 tablet by mouth every morning  . glucose blood (ACCU-CHEK AVIVA PLUS) test strip TEST ONCE DAILY,  DX  250.00  . losartan (COZAAR) 50 MG tablet Take 1 tablet (50 mg total) by mouth daily.  . metFORMIN (GLUCOPHAGE-XR) 500 MG 24 hr tablet Take one tablet by mouth two times daily  . montelukast (SINGULAIR) 10 MG tablet Take 1 tablet (10 mg total) by mouth at bedtime.  . sitaGLIPtin (JANUVIA) 100 MG tablet Take 1 tablet (100 mg total) by mouth daily.  . [DISCONTINUED] azithromycin (ZITHROMAX) 250 MG tablet Take as directed  . [DISCONTINUED] chlorpheniramine-HYDROcodone (TUSSIONEX PENNKINETIC ER) 10-8 MG/5ML LQCR Take 5 mLs by mouth every 12 (twelve) hours as needed for cough.  . [DISCONTINUED] methylPREDNISolone (MEDROL DOSEPAK) 4 MG tablet follow package directions  . [DISCONTINUED] predniSONE (STERAPRED UNI-PAK) 5 MG TABS tablet Take as directed    Allergies  Allergen Reactions  . Other     Flu vaccine due to egg whites that she was allergic to as a child    Current Medications, Allergies, Past Medical History, Past Surgical History, Family History, and Social History were reviewed in [employer].    Review of Systems        Constitutional:   No  weight loss, night sweats,  Fevers, chills, fatigue, or  lassitude.  HEENT:   No headaches,  Difficulty swallowing,  Tooth/dental problems, or  Sore throat,                No sneezing, itching, ear ache, nasal congestion, post nasal drip,   CV:  No chest pain,  Orthopnea, PND, swelling in lower extremities, anasarca, dizziness, palpitations, syncope.   GI  ++ heartburn, indigestion,  NO abdominal pain, nausea, vomiting, diarrhea, change in bowel habits, loss of appetite, bloody stools.   Resp: No shortness of breath with exertion or at rest.  No excess mucus,  no productive cough,  No non-productive cough,  No coughing up of blood.  No change in color of mucus.  No wheezing.  No chest wall deformity  Skin: no rash or lesions.  GU: no dysuria, change in color of urine, no urgency or frequency.  No flank pain, no hematuria   MS:  No joint pain or swelling.  No decreased range of motion.  No back pain.  Psych:  No change in mood or affect. No depression or anxiety.  No memory loss.       Objective:   Physical Exam     WD, WN, 58 y/o WF in NAD... GENERAL:  Alert & oriented; pleasant & cooperative... HEENT:  [place]/AT, EOM-wnl, PERRLA, EACs-clear, TMs-wnl, NOSE-clear, THROAT-clear & wnl. NECK:  Supple w/ full ROM; no JVD; normal carotid impulses w/o bruits; no thyromegaly or nodules palpated; no lymphadenopathy. Scar on neck from remote birthmark removal... CHEST:  Clear to P & A; without wheezes/ rales/ or rhonchi. Right breast scar from lumpectomy... HEART:  Regular Rhythm; without murmurs/ rubs/ or gallops. ABDOMEN:  Scar of surg, soft & nontender; normal bowel sounds; no organomegaly or masses detected., no guarding or rebound  EXT: without deformities or arthritic changes; no varicose veins/ venous insuffic/ or edema. NEURO:   gait normal & balance OK. DERM:  No lesions noted; no rash etc...    Assessment & Plan:

## 2013-11-02 NOTE — Assessment & Plan Note (Signed)
GERD   Plan  Begin Prilosec 20mg  daily before meal x 1 month .  GERD diet  Fluids and rest  Follow up As needed   Please contact office for sooner follow up if symptoms do not improve or worsen or seek emergency care  Dr. Lenna Gilford is retiring and we will need to set you up with a new Primary Care provider.  Please let us know if you would like [place] to assist with this referral.

## 2013-11-16 ENCOUNTER — Telehealth: Payer: Self-pay | Admitting: Pulmonary Disease

## 2013-11-16 NOTE — Telephone Encounter (Signed)
Don't see anything in epic. Please advise leigh if you tried calling pt? thanks

## 2013-11-17 NOTE — Telephone Encounter (Signed)
Pt is returning [name]'s call again.  Amber Washington

## 2013-11-17 NOTE — Telephone Encounter (Signed)
Called and spoke with pt and she is aware that she will need to set up with new primary care.  Pt was given the number to primary care at the [place] office and she will call to set up new appt.  Nothing further is needed.

## 2013-12-26 ENCOUNTER — Ambulatory Visit: Payer: BC Managed Care – PPO | Admitting: Pulmonary Disease

## 2013-12-27 ENCOUNTER — Other Ambulatory Visit: Payer: Self-pay | Admitting: Pulmonary Disease

## 2014-01-23 ENCOUNTER — Encounter: Payer: Self-pay | Admitting: Family Medicine

## 2014-01-23 ENCOUNTER — Ambulatory Visit (INDEPENDENT_AMBULATORY_CARE_PROVIDER_SITE_OTHER): Payer: BC Managed Care – PPO | Admitting: Family Medicine

## 2014-01-23 VITALS — BP 118/70 | HR 111 | Temp 99.6°F | Wt 170.0 lb

## 2014-01-23 DIAGNOSIS — E119 Type 2 diabetes mellitus without complications: Secondary | ICD-10-CM

## 2014-01-23 DIAGNOSIS — J45901 Unspecified asthma with (acute) exacerbation: Secondary | ICD-10-CM

## 2014-01-23 MED ORDER — ALBUTEROL SULFATE (5 MG/ML) 0.5% IN NEBU: 2.5000 mg | INHALATION_SOLUTION | Freq: Once | RESPIRATORY_TRACT | Status: DC

## 2014-01-23 MED ORDER — ONETOUCH DELICA LANCETS 33G MISC: Status: DC

## 2014-01-23 MED ORDER — ALBUTEROL SULFATE HFA 108 (90 BASE) MCG/ACT IN AERS: 2.0000 | INHALATION_SPRAY | Freq: Four times a day (QID) | RESPIRATORY_TRACT | Status: DC | PRN

## 2014-01-23 MED ORDER — FLUTICASONE-SALMETEROL 250-50 MCG/DOSE IN AEPB: INHALATION_SPRAY | RESPIRATORY_TRACT | Status: DC

## 2014-01-23 MED ORDER — PREDNISONE 10 MG PO TABS: ORAL_TABLET | ORAL | Status: DC

## 2014-01-23 MED ORDER — METHYLPREDNISOLONE ACETATE 80 MG/ML IJ SUSP
80.0000 mg | Freq: Once | INTRAMUSCULAR | Status: AC
  Administered 2014-01-23: 80 mg via INTRAMUSCULAR

## 2014-01-23 MED ORDER — GLUCOSE BLOOD VI STRP: ORAL_STRIP | Status: DC

## 2014-01-23 NOTE — Progress Notes (Signed)
Pre visit review using our clinic review tool, if applicable. No additional management support is needed unless otherwise documented below in the visit note. 

## 2014-01-23 NOTE — Progress Notes (Signed)
  Subjective:     Amber Washington is a 58 y.o. female here for evaluation of a cough. Onset of symptoms was 3 weeks ago. Symptoms have been gradually worsening since that time. The cough is barky and productive and is aggravated by pollens and reclining position. Associated symptoms include: shortness of breath, sputum production and wheezing. Patient does have a history of asthma. Patient does have a history of environmental allergens. Patient has not traveled recently. Patient does not have a history of smoking. Patient has not had a previous chest x-ray. Patient has not had a PPD done.  The following portions of the patient's history were reviewed and updated as appropriate: allergies, current medications, past family history, past medical history, past social history, past surgical history and problem list.  Review of Systems Pertinent items are noted in HPI.    Objective:    Oxygen saturation 95% on room air BP 118/70  Pulse 111  Temp(Src) 99.6 F (37.6 C) (Oral)  Wt 170 lb (77.111 kg)  SpO2 95% General appearance: alert, cooperative, appears stated age and no distress Ears: normal TM's and external ear canals both ears Nose: Nares normal. Septum midline. Mucosa normal. No drainage or sinus tenderness. Throat: lips, mucosa, and tongue normal; teeth and gums normal Neck: no adenopathy, supple, symmetrical, trachea midline and thyroid not enlarged, symmetric, no tenderness/mass/nodules Lungs: wheezes bilaterally Heart: S1, S2 normal Extremities: extremities normal, atraumatic, no cyanosis or edema    Assessment:    Asthma    Plan:    B-agonist inhaler. Call if shortness of breath worsens, blood in sputum, change in character of cough, development of fever or chills, inability to maintain nutrition and hydration. Avoid exposure to tobacco smoke and fumes. Steroid inhaler as ordered. pred taper -- refill inhalers  Depo medrol given Call with any  ? Or worsening symptoms  1.  Asthma with acute exacerbation \ - predniSONE (DELTASONE) 10 MG tablet; 3 po qd for 3 days then 2 po qd for 3 days the 1 po qd for 3 days  Dispense: 18 tablet; Refill: 0 - Fluticasone-Salmeterol (ADVAIR DISKUS) 250-50 MCG/DOSE AEPB; USE ONE INHALATION TWO TIMES A DAY  Dispense: 3 each; Refill: 0 - albuterol (PROVENTIL HFA;VENTOLIN HFA) 108 (90 BASE) MCG/ACT inhaler; Inhale 2 puffs into the lungs every 6 (six) hours as needed.  Dispense: 3 Inhaler; Refill: 3 - methylPREDNISolone acetate (DEPO-MEDROL) injection 80 mg; Inject 1 mL (80 mg total) into the muscle once.  2. Diabetes If sugars start going up call office for insulin and ss coverage--- pt states it normally does not go up - ONETOUCH DELICA LANCETS 70J MISC; As directed  Dispense: 100 each; Refill: 11 - glucose blood test strip; Use as instructed  Dispense: 100 each; Refill: 12

## 2014-01-23 NOTE — Patient Instructions (Signed)
Asthma, Adult Asthma is a recurring condition in which the airways tighten and narrow. Asthma can make it difficult to breathe. It can cause coughing, wheezing, and shortness of breath. Asthma episodes (also called asthma attacks) range from minor to life-threatening. Asthma cannot be cured, but medicines and lifestyle changes can help control it. CAUSES Asthma is believed to be caused by inherited (genetic) and environmental factors, but its exact cause is unknown. Asthma may be triggered by allergens, lung infections, or irritants in the air. Asthma triggers are different for each person. Common triggers include:   Animal dander.  Dust mites.  Cockroaches.  Pollen from trees or grass.  Mold.  Smoke.  Air pollutants such as dust, household cleaners, hair sprays, aerosol sprays, paint fumes, strong chemicals, or strong odors.  Cold air, weather changes, and winds (which increase molds and pollens in the air).  Strong emotional expressions such as crying or laughing hard.  Stress.  Certain medicines (such as aspirin) or types of drugs (such as beta-blockers).  Sulfites in foods and drinks. Foods and drinks that may contain sulfites include dried fruit, potato chips, and sparkling grape juice.  Infections or inflammatory conditions such as the flu, a cold, or an inflammation of the nasal membranes (rhinitis).  Gastroesophageal reflux disease (GERD).  Exercise or strenuous activity. SYMPTOMS Symptoms may occur immediately after asthma is triggered or many hours later. Symptoms include:  Wheezing.  Excessive nighttime or early morning coughing.  Frequent or severe coughing with a common cold.  Chest tightness.  Shortness of breath. DIAGNOSIS  The diagnosis of asthma is made by a review of your medical history and a physical exam. Tests may also be performed. These may include:  Lung function studies. These tests show how much air you breath in and out.  Allergy  tests.  Imaging tests such as X-rays. TREATMENT  Asthma cannot be cured, but it can usually be controlled. Treatment involves identifying and avoiding your asthma triggers. It also involves medicines. There are 2 classes of medicine used for asthma treatment:   Controller medicines. These prevent asthma symptoms from occurring. They are usually taken every day.  Reliever or rescue medicines. These quickly relieve asthma symptoms. They are used as needed and provide short-term relief. Your health care provider will help you create an asthma action plan. An asthma action plan is a written plan for managing and treating your asthma attacks. It includes a list of your asthma triggers and how they may be avoided. It also includes information on when medicines should be taken and when their dosage should be changed. An action plan may also involve the use of a device called a peak flow meter. A peak flow meter measures how well the lungs are working. It helps you monitor your condition. HOME CARE INSTRUCTIONS   Take medicine as directed by your health care provider. Speak with your health care provider if you have questions about how or when to take the medicines.  Use a peak flow meter as directed by your health care provider. Record and keep track of readings.  Understand and use the action plan to help minimize or stop an asthma attack without needing to seek medical care.  Control your home environment in the following ways to help prevent asthma attacks:  Do not smoke. Avoid being exposed to secondhand smoke.  Change your heating and air conditioning filter regularly.  Limit your use of fireplaces and wood stoves.  Get rid of pests (such as roaches and   mice) and their droppings.  Throw away plants if you see mold on them.  Clean your floors and dust regularly. Use unscented cleaning products.  Try to have someone else vacuum for you regularly. Stay out of rooms while they are being  vacuumed and for a short while afterward. If you vacuum, use a dust mask from a hardware store, a double-layered or microfilter vacuum cleaner bag, or a vacuum cleaner with a HEPA filter.  Replace carpet with wood, tile, or vinyl flooring. Carpet can trap dander and dust.  Use allergy-proof pillows, mattress covers, and box spring covers.  Wash bed sheets and blankets every week in hot water and dry them in a dryer.  Use blankets that are made of polyester or cotton.  Clean bathrooms and kitchens with bleach. If possible, have someone repaint the walls in these rooms with mold-resistant paint. Keep out of the rooms that are being cleaned and painted.  Wash hands frequently. SEEK MEDICAL CARE IF:   You have wheezing, shortness of breath, or a cough even if taking medicine to prevent attacks.  The colored mucus you cough up (sputum) is thicker than usual.  Your sputum changes from clear or white to yellow, green, gray, or bloody.  You have any problems that may be related to the medicines you are taking (such as a rash, itching, swelling, or trouble breathing).  You are using a reliever medicine more than 2 3 times per week.  Your peak flow is still at 50 79% of you personal best after following your action plan for 1 hour. SEEK IMMEDIATE MEDICAL CARE IF:   You seem to be getting worse and are unresponsive to treatment during an asthma attack.  You are short of breath even at rest.  You get short of breath when doing very little physical activity.  You have difficulty eating, drinking, or talking due to asthma symptoms.  You develop chest pain.  You develop a fast heartbeat.  You have a bluish color to your lips or fingernails.  You are lightheaded, dizzy, or faint.  Your peak flow is less than 50% of your personal best.  You have a fever or persistent symptoms for more than 2 3 days.  You have a fever and symptoms suddenly get worse. MAKE SURE YOU:   Understand these  instructions.  Will watch your condition.  Will get help right away if you are not doing well or get worse. Document Released: 08/25/2005 Document Revised: 04/27/2013 Document Reviewed: 03/24/2013 ExitCare Patient Information 2014 [employer].  

## 2014-01-25 ENCOUNTER — Telehealth: Payer: Self-pay | Admitting: Family Medicine

## 2014-01-25 MED ORDER — AMOXICILLIN-POT CLAVULANATE 875-125 MG PO TABS: 1.0000 | ORAL_TABLET | Freq: Two times a day (BID) | ORAL | Status: DC

## 2014-01-25 NOTE — Telephone Encounter (Signed)
augmentin 875 mg 1 po bid for 10 days--- ov if no better after that

## 2014-01-25 NOTE — Telephone Encounter (Signed)
Caller name: Kylah  Call back number: [phone] Pharmacy: Suzie Portela @ [facility]  Reason for call:   Pt was seen on 5/18 for New Pt, but has since started having bad drainage and congestion.  Pt wants to know if she can get something called in for this, or does she need an apt.  Please advise.

## 2014-01-25 NOTE — Telephone Encounter (Signed)
Pt was seen by Etter Sjogren

## 2014-01-25 NOTE — Telephone Encounter (Signed)
Pt called to see if anything had been seen in regards to her phone call from this morning.  Informed pt that Dr had ordered an RX, and it would be called in as soon as the CMA got to the messages.  Pt understood, just didn't want to have a bad night like last night.

## 2014-01-25 NOTE — Telephone Encounter (Signed)
Spoke with patient and she is aware the Rx has been sent. She stated she is having a cough and I advised she can take OTC delsym and if no relief in a few days to call us back. She agreed.     KP

## 2014-02-15 ENCOUNTER — Telehealth: Payer: Self-pay

## 2014-02-15 NOTE — Telephone Encounter (Signed)
Medication and allergies:  Reviewed and updated  90 day supply/mail order:  [place], [place] pharmacy: [facility]  - [place]    Immunizations due:  UTD   A/P: No changes to personal, family history or past surgical hx PAP- 04/2013--normal per patient-OB-GYN-Eve Key, NP [facility]- 04/16/09-colon polyp--Dr. Medoff [facility]- 04/25/13--BI-RAD-negative BD- 12/08/00-normal Flu- allergy to egg whites Tdap- 07/07/12 PNA 23- 09/08/1998   To Discuss with Provider: She would like a physical and labs drawn with this appointment. Having issues with receiving test strips from [place] due to insurance. ---called [facility] on [address] who stated that they have faxed [place] three times regarding the strips and now the prescription is on hold.  Pharm Tech was asked to refax form to our main fax. She stated that she would.  Faxed received at 1:55pm.  Form placed in CMA yellow folder.

## 2014-02-16 ENCOUNTER — Encounter: Payer: Self-pay | Admitting: Family Medicine

## 2014-02-16 ENCOUNTER — Ambulatory Visit (INDEPENDENT_AMBULATORY_CARE_PROVIDER_SITE_OTHER): Payer: BC Managed Care – PPO | Admitting: Family Medicine

## 2014-02-16 ENCOUNTER — Other Ambulatory Visit: Payer: Self-pay | Admitting: General Practice

## 2014-02-16 VITALS — BP 122/84 | HR 88 | Temp 98.4°F | Resp 16 | Ht 69.0 in | Wt 170.2 lb

## 2014-02-16 DIAGNOSIS — E119 Type 2 diabetes mellitus without complications: Secondary | ICD-10-CM | POA: Diagnosis not present

## 2014-02-16 DIAGNOSIS — J45909 Unspecified asthma, uncomplicated: Secondary | ICD-10-CM

## 2014-02-16 DIAGNOSIS — Z79899 Other long term (current) drug therapy: Secondary | ICD-10-CM | POA: Diagnosis not present

## 2014-02-16 DIAGNOSIS — E7800 Pure hypercholesterolemia, unspecified: Secondary | ICD-10-CM | POA: Diagnosis not present

## 2014-02-16 DIAGNOSIS — I10 Essential (primary) hypertension: Secondary | ICD-10-CM

## 2014-02-16 LAB — LIPID PANEL
CHOLESTEROL: 152 mg/dL (ref 0–200)
HDL: 58.6 mg/dL (ref >39.00)
LDL Cholesterol: 79 mg/dL (ref 0–99)
NonHDL: 93.4
TRIGLYCERIDES: 72 mg/dL (ref 0.0–149.0)
Total CHOL/HDL Ratio: 3
VLDL: 14.4 mg/dL (ref 0.0–40.0)

## 2014-02-16 LAB — CBC WITH DIFFERENTIAL/PLATELET
BASOS PCT: 0.3 % (ref 0.0–3.0)
Basophils Absolute: 0 10*3/uL (ref 0.0–0.1)
EOS PCT: 3.1 % (ref 0.0–5.0)
Eosinophils Absolute: 0.2 10*3/uL (ref 0.0–0.7)
HCT: 37.6 % (ref 36.0–46.0)
Hemoglobin: 12.4 g/dL (ref 12.0–15.0)
LYMPHS PCT: 36.5 % (ref 12.0–46.0)
Lymphs Abs: 2.7 10*3/uL (ref 0.7–4.0)
MCHC: 33.1 g/dL (ref 30.0–36.0)
MCV: 88.7 fl (ref 78.0–100.0)
Monocytes Absolute: 0.7 10*3/uL (ref 0.1–1.0)
Monocytes Relative: 10.2 % (ref 3.0–12.0)
Neutro Abs: 3.6 10*3/uL (ref 1.4–7.7)
Neutrophils Relative %: 49.9 % (ref 43.0–77.0)
Platelets: 310 10*3/uL (ref 150.0–400.0)
RBC: 4.24 Mil/uL (ref 3.87–5.11)
RDW: 14.1 % (ref 11.5–15.5)
WBC: 7.3 10*3/uL (ref 4.0–10.5)

## 2014-02-16 LAB — BASIC METABOLIC PANEL
BUN: 24 mg/dL — ABNORMAL HIGH (ref 6–23)
CO2: 31 meq/L (ref 19–32)
Calcium: 9.9 mg/dL (ref 8.4–10.5)
Chloride: 104 mEq/L (ref 96–112)
Creatinine, Ser: 0.9 mg/dL (ref 0.4–1.2)
GFR: 71.69 mL/min (ref >60.00)
Glucose, Bld: 123 mg/dL — ABNORMAL HIGH (ref 70–99)
Potassium: 4.8 mEq/L (ref 3.5–5.1)
Sodium: 142 mEq/L (ref 135–145)

## 2014-02-16 LAB — HEPATIC FUNCTION PANEL
ALT: 20 U/L (ref 0–35)
AST: 21 U/L (ref 0–37)
Albumin: 4.1 g/dL (ref 3.5–5.2)
Alkaline Phosphatase: 65 U/L (ref 39–117)
BILIRUBIN TOTAL: 0.3 mg/dL (ref 0.2–1.2)
Bilirubin, Direct: 0.1 mg/dL (ref 0.0–0.3)
TOTAL PROTEIN: 7.6 g/dL (ref 6.0–8.3)

## 2014-02-16 LAB — TSH: TSH: 0.79 u[IU]/mL (ref 0.35–4.50)

## 2014-02-16 LAB — HEMOGLOBIN A1C: Hgb A1c MFr Bld: 8.5 % — ABNORMAL HIGH (ref 4.6–6.5)

## 2014-02-16 MED ORDER — GLUCOSE BLOOD VI STRP: ORAL_STRIP | Status: DC

## 2014-02-16 NOTE — Assessment & Plan Note (Signed)
New to provider, ongoing for pt.  She feels that sugars are not well controlled at this point and have been climbing.  UTD on eye exam.  Asymptomatic.  On ARB for renal protection.  Check labs.  Adjust meds prn

## 2014-02-16 NOTE — Progress Notes (Signed)
   Subjective:    Patient ID: Amber Washington, female    DOB: [birth year], 58 y.o.   MRN: [id number]  HPI New to establish.  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  Currently on Januvia, Metformin, and Amaryl.  Last A1C 7.08 Jun 2013.  CBG this AM 155.  Typically fasting CBGs in 130s.  On Losartan for renal protection.  Exercising but not regularly.  Tries to limit carbs.  UTD on yearly eye exam (Oct 2014)  HTN- chronic problem, well controlled.  No CP, SOB, HAs, visual changes, edema.  Hyperlipidemia- chronic problem, on Lipitor.  No abd pain, N/V, myalgias  Asthma- chronic problem, has been following w/ Dr Leeanne Deed.  Has been well controlled on Advair daily and albuterol.  Daily Singulair for control.  Rarely using albuterol.   Review of Systems For ROS see HPI     Objective:   Physical Exam  Vitals reviewed. Constitutional: She is oriented to person, place, and time. She appears well-developed and well-nourished. No distress.  HENT:  Head: Normocephalic and atraumatic.  Eyes: Conjunctivae and EOM are normal. Pupils are equal, round, and reactive to light.  Neck: Normal range of motion. Neck supple. No thyromegaly present.  Cardiovascular: Normal rate, regular rhythm, normal heart sounds and intact distal pulses.   No murmur heard. Pulmonary/Chest: Effort normal and breath sounds normal. No respiratory distress.  Abdominal: Soft. She exhibits no distension. There is no tenderness.  Musculoskeletal: She exhibits no edema.  Lymphadenopathy:    She has no cervical adenopathy.  Neurological: She is alert and oriented to person, place, and time.  Skin: Skin is warm and dry.  Psychiatric: She has a normal mood and affect. Her behavior is normal.          Assessment & Plan:

## 2014-02-16 NOTE — Assessment & Plan Note (Signed)
New to provider, ongoing for pt.  Tolerating statin w/o difficulty.  Check labs.  Adjust meds prn  

## 2014-02-16 NOTE — Assessment & Plan Note (Signed)
New to provider.  BP is well controlled today but pt is on ARB for diabetic renal protection.  Check labs.  No anticipated med changes.

## 2014-02-16 NOTE — Patient Instructions (Signed)
Follow up in 3-4 months to recheck diabetes We'll notify you of your lab results and make any changes if needed Call me when you're running low on meds so we can make changes if needed Keep up the good work on low carb diet and regular exercise- you look great! Call with any questions or concerns Welcome!  We're glad to have you!!

## 2014-02-16 NOTE — Progress Notes (Signed)
Pre visit review using our clinic review tool, if applicable. No additional management support is needed unless otherwise documented below in the visit note. 

## 2014-02-16 NOTE — Assessment & Plan Note (Signed)
Chronic problem for pt.  Has been following w/ Dr Leeanne Deed.  Pt feels sxs are well controlled and is not using Advair twice daily, will use once if she remembers.  Discussed possibility of backing down from dual agent to using just ICS for maintenance.  Pt in agreement w/ this but just refilled Advair and got 3 inhalers.  Will make change when she is due for next refill.  Pt expressed understanding and is in agreement w/ plan.

## 2014-02-17 ENCOUNTER — Telehealth: Payer: Self-pay | Admitting: Family Medicine

## 2014-02-17 NOTE — Telephone Encounter (Signed)
Relevant patient education assigned to patient using Emmi. ° °

## 2014-04-03 ENCOUNTER — Other Ambulatory Visit: Payer: Self-pay

## 2014-04-03 ENCOUNTER — Telehealth: Payer: Self-pay | Admitting: Family Medicine

## 2014-04-03 DIAGNOSIS — Z1231 Encounter for screening mammogram for malignant neoplasm of breast: Secondary | ICD-10-CM

## 2014-04-03 MED ORDER — GLIMEPIRIDE 1 MG PO TABS: ORAL_TABLET | ORAL | Status: DC

## 2014-04-03 NOTE — Telephone Encounter (Signed)
Spoke with pt, she is taking 1 tablet by mouth daily med filled to local pharmacy as requested.

## 2014-04-03 NOTE — Telephone Encounter (Signed)
Caller name: Bryleigh Relation to pt: Call back [phone] Pharmacy: [facility] [address]  Reason for call:   Pt has had an increase in the dosage of her glimepiride (AMARYL) 1 MG tablet and is about to run out.  Pt would like a refill on this rx.

## 2014-04-24 ENCOUNTER — Ambulatory Visit
Admission: RE | Admit: 2014-04-24 | Discharge: 2014-04-24 | Disposition: A | Discharge status: Home / Self Care | Payer: BC Managed Care – PPO | Source: Ambulatory Visit

## 2014-04-24 DIAGNOSIS — Z1231 Encounter for screening mammogram for malignant neoplasm of breast: Secondary | ICD-10-CM

## 2014-05-31 ENCOUNTER — Ambulatory Visit (INDEPENDENT_AMBULATORY_CARE_PROVIDER_SITE_OTHER): Payer: BC Managed Care – PPO | Admitting: Family Medicine

## 2014-05-31 ENCOUNTER — Encounter: Payer: Self-pay | Admitting: Family Medicine

## 2014-05-31 VITALS — BP 118/72 | HR 69 | Temp 98.0°F | Resp 16 | Wt 172.1 lb

## 2014-05-31 DIAGNOSIS — E119 Type 2 diabetes mellitus without complications: Secondary | ICD-10-CM

## 2014-05-31 DIAGNOSIS — G4700 Insomnia, unspecified: Secondary | ICD-10-CM | POA: Insufficient documentation

## 2014-05-31 LAB — BASIC METABOLIC PANEL
BUN: 21 mg/dL (ref 6–23)
CO2: 32 mEq/L (ref 19–32)
CREATININE: 0.9 mg/dL (ref 0.4–1.2)
Calcium: 9.8 mg/dL (ref 8.4–10.5)
Chloride: 102 mEq/L (ref 96–112)
GFR: 67.14 mL/min (ref >60.00)
Glucose, Bld: 130 mg/dL — ABNORMAL HIGH (ref 70–99)
Potassium: 5.2 mEq/L — ABNORMAL HIGH (ref 3.5–5.1)
Sodium: 140 mEq/L (ref 135–145)

## 2014-05-31 LAB — HEMOGLOBIN A1C: Hgb A1c MFr Bld: 7.2 % — ABNORMAL HIGH (ref 4.6–6.5)

## 2014-05-31 MED ORDER — SITAGLIP PHOS-METFORMIN HCL ER 100-1000 MG PO TB24: 1.0000 | ORAL_TABLET | Freq: Every day | ORAL | Status: DC

## 2014-05-31 MED ORDER — TRAZODONE HCL 50 MG PO TABS: 25.0000 mg | ORAL_TABLET | Freq: Every evening | ORAL | Status: DC | PRN

## 2014-05-31 NOTE — Progress Notes (Signed)
Pre visit review using our clinic review tool, if applicable. No additional management support is needed unless otherwise documented below in the visit note. 

## 2014-05-31 NOTE — Assessment & Plan Note (Signed)
New.  Pt has hx of inability to sleep and has tried multiple meds- both prescription and OTC.  Will start Trazodone and monitor for improvement.  If none, will start Belsomra and complete prior authorization prn.  Pt expressed understanding and is in agreement w/ plan.

## 2014-05-31 NOTE — Assessment & Plan Note (Addendum)
Chronic problem.  Tolerating meds w/o difficulty.  Combine Metformin and Januvia at pt's request.  On ARB for renal protection.  Due for eye exam- referral placed as pt prefers new MD.  Check labs.  Adjust meds prn

## 2014-05-31 NOTE — Patient Instructions (Signed)
Follow up in 3-4 months to recheck diabetes and cholesterol Start the trazodone nightly for sleep- start w/ 1/2 tab and increase to whole tab as needed.  Alternate w/ xanax. Start the [place] combo pill We'll notify you of your lab results and make any changes if needed Keep up the good work!  You look great! Call with any questions or concerns Happy Fall!!!

## 2014-05-31 NOTE — Progress Notes (Signed)
   Subjective:    Patient ID: Amber Washington, female    DOB: 1956/05/31, 58 y.o.   MRN: [id number]  HPI DM- chronic problem for pt.  On Januvia, Metformin, Amaryl.  On ARB for renal protection.  Pt reports CBGs are labile- 'I can get below 100 and then this AM it was 154'.  Pt follows low carb diet.  Walking regularly.  Due for eye exam- has been seeing Dr Herbert Deaner, asking for new referral.  No CP, SOB, HAs, visual changes, edema, N/V, abd pain, weakness/numbness.  Insomnia- chronic problem, taking xanax at night to 'calm me down'.  Now able to fall asleep but unable to stay asleep.  Has tried Lunesta, Ambien, Tylenol PM, Melatonin.   Review of Systems For ROS see HPI     Objective:   Physical Exam  Vitals reviewed. Constitutional: She is oriented to person, place, and time. She appears well-developed and well-nourished. No distress.  HENT:  Head: Normocephalic and atraumatic.  Eyes: Conjunctivae and EOM are normal. Pupils are equal, round, and reactive to light.  Neck: Normal range of motion. Neck supple. No thyromegaly present.  Cardiovascular: Normal rate, regular rhythm, normal heart sounds and intact distal pulses.   No murmur heard. Pulmonary/Chest: Effort normal and breath sounds normal. No respiratory distress.  Abdominal: Soft. She exhibits no distension. There is no tenderness.  Musculoskeletal: She exhibits no edema.  Lymphadenopathy:    She has no cervical adenopathy.  Neurological: She is alert and oriented to person, place, and time.  Skin: Skin is warm and dry.  Psychiatric: She has a normal mood and affect. Her behavior is normal.          Assessment & Plan:

## 2014-06-22 ENCOUNTER — Other Ambulatory Visit: Payer: Self-pay | Admitting: Pulmonary Disease

## 2014-06-27 LAB — HM DIABETES EYE EXAM

## 2014-07-01 ENCOUNTER — Encounter: Payer: Self-pay | Admitting: General Practice

## 2014-07-10 ENCOUNTER — Telehealth: Payer: Self-pay | Admitting: Family Medicine

## 2014-07-10 MED ORDER — GLIMEPIRIDE 1 MG PO TABS: ORAL_TABLET | ORAL | Status: DC

## 2014-07-10 NOTE — Telephone Encounter (Signed)
Pt states that she recently received a refill from [facility] for Glimiperide 1 mg tablets.  The instructions stated to take 1/2-1 tablet by mouth daily #45.  Pt states she needs a new prescription written to reflect current dosage of 1 mg by mouth daily.    Verbal orders given by Dr. Birdie Riddle to refill prescription for #90, 0 refills.  Request filled and sent to [facility].  Pt called and made aware.  Next appointment already scheduled for 10/04/14 @ 8:45 am with Dr. Birdie Riddle.

## 2014-07-10 NOTE — Telephone Encounter (Signed)
Could you call pt to inquire about her medication questions?

## 2014-07-10 NOTE — Telephone Encounter (Signed)
Left a message for call back.  

## 2014-07-10 NOTE — Telephone Encounter (Signed)
Caller name: Julio Relation to pt: self Call back number: [phone] [facility]acy[facility]  Reason for call:   Please call patient regarding glimiperide rx. She has questions regarding directions and refill.

## 2014-08-01 ENCOUNTER — Other Ambulatory Visit: Payer: Self-pay | Admitting: General Practice

## 2014-08-01 MED ORDER — MONTELUKAST SODIUM 10 MG PO TABS: 10.0000 mg | ORAL_TABLET | Freq: Every day | ORAL | Status: DC

## 2014-08-01 MED ORDER — LOSARTAN POTASSIUM 50 MG PO TABS: 50.0000 mg | ORAL_TABLET | Freq: Every day | ORAL | Status: DC

## 2014-08-01 MED ORDER — ATORVASTATIN CALCIUM 10 MG PO TABS: ORAL_TABLET | ORAL | Status: DC

## 2014-08-01 NOTE — Telephone Encounter (Signed)
Last OV 05-31-14 Alprazolam last filled 08/24/13 #90 with 5 (last prescribed by Dr. Lenna Gilford)

## 2014-08-02 MED ORDER — ALPRAZOLAM 0.5 MG PO TABS: ORAL_TABLET | ORAL | Status: DC

## 2014-08-02 NOTE — Telephone Encounter (Signed)
Med filled and faxed.  

## 2014-10-04 ENCOUNTER — Ambulatory Visit (INDEPENDENT_AMBULATORY_CARE_PROVIDER_SITE_OTHER): Payer: BC Managed Care – PPO | Admitting: Family Medicine

## 2014-10-04 ENCOUNTER — Encounter: Payer: Self-pay | Admitting: Family Medicine

## 2014-10-04 VITALS — BP 118/78 | HR 72 | Temp 97.6°F | Resp 16 | Wt 170.0 lb

## 2014-10-04 DIAGNOSIS — I10 Essential (primary) hypertension: Secondary | ICD-10-CM

## 2014-10-04 DIAGNOSIS — E119 Type 2 diabetes mellitus without complications: Secondary | ICD-10-CM

## 2014-10-04 DIAGNOSIS — E7800 Pure hypercholesterolemia, unspecified: Secondary | ICD-10-CM

## 2014-10-04 LAB — BASIC METABOLIC PANEL
BUN: 18 mg/dL (ref 6–23)
CALCIUM: 9.9 mg/dL (ref 8.4–10.5)
CHLORIDE: 100 meq/L (ref 96–112)
CO2: 30 meq/L (ref 19–32)
Creatinine, Ser: 1.09 mg/dL (ref 0.40–1.20)
GFR: 55.14 mL/min — AB (ref >60.00)
Glucose, Bld: 100 mg/dL — ABNORMAL HIGH (ref 70–99)
POTASSIUM: 4.5 meq/L (ref 3.5–5.1)
Sodium: 136 mEq/L (ref 135–145)

## 2014-10-04 LAB — CBC WITH DIFFERENTIAL/PLATELET
Basophils Absolute: 0 10*3/uL (ref 0.0–0.1)
Basophils Relative: 0.6 % (ref 0.0–3.0)
EOS PCT: 4.7 % (ref 0.0–5.0)
Eosinophils Absolute: 0.3 10*3/uL (ref 0.0–0.7)
HCT: 38.7 % (ref 36.0–46.0)
HEMOGLOBIN: 13.1 g/dL (ref 12.0–15.0)
LYMPHS PCT: 29.4 % (ref 12.0–46.0)
Lymphs Abs: 2.1 10*3/uL (ref 0.7–4.0)
MCHC: 33.9 g/dL (ref 30.0–36.0)
MCV: 85.6 fl (ref 78.0–100.0)
MONO ABS: 0.8 10*3/uL (ref 0.1–1.0)
Monocytes Relative: 11.8 % (ref 3.0–12.0)
Neutro Abs: 3.8 10*3/uL (ref 1.4–7.7)
Neutrophils Relative %: 53.5 % (ref 43.0–77.0)
Platelets: 345 10*3/uL (ref 150.0–400.0)
RBC: 4.52 Mil/uL (ref 3.87–5.11)
RDW: 13.6 % (ref 11.5–15.5)
WBC: 7.2 10*3/uL (ref 4.0–10.5)

## 2014-10-04 LAB — HEMOGLOBIN A1C: Hgb A1c MFr Bld: 7.4 % — ABNORMAL HIGH (ref 4.6–6.5)

## 2014-10-04 LAB — HEPATIC FUNCTION PANEL
ALK PHOS: 64 U/L (ref 39–117)
ALT: 20 U/L (ref 0–35)
AST: 25 U/L (ref 0–37)
Albumin: 4.4 g/dL (ref 3.5–5.2)
BILIRUBIN DIRECT: 0.2 mg/dL (ref 0.0–0.3)
TOTAL PROTEIN: 7.8 g/dL (ref 6.0–8.3)
Total Bilirubin: 0.4 mg/dL (ref 0.2–1.2)

## 2014-10-04 LAB — TSH: TSH: 1.7 u[IU]/mL (ref 0.35–4.50)

## 2014-10-04 LAB — LIPID PANEL
Cholesterol: 123 mg/dL (ref 0–200)
HDL: 51.9 mg/dL (ref >39.00)
LDL CALC: 55 mg/dL (ref 0–99)
NonHDL: 71.1
Total CHOL/HDL Ratio: 2
Triglycerides: 79 mg/dL (ref 0.0–149.0)
VLDL: 15.8 mg/dL (ref 0.0–40.0)

## 2014-10-04 NOTE — Assessment & Plan Note (Signed)
Chronic problem.  Tolerating statin w/o difficulty.  Check labs.  Adjust meds prn  

## 2014-10-04 NOTE — Assessment & Plan Note (Signed)
Chronic problem.  Tolerating meds w/o difficulty w/ exception of intermittent mid-morning lows.  UTD on eye exam, on ARB for renal protection.  Compliant w/ meds.  Check labs.  Adjust meds prn

## 2014-10-04 NOTE — Assessment & Plan Note (Signed)
Chronic problem.  Well controlled.  Asymptomatic.  Check labs.  No anticipated med changes. 

## 2014-10-04 NOTE — Progress Notes (Signed)
   Subjective:    Patient ID: Amber Washington, female    DOB: 1956-01-30, 59 y.o.   MRN: [id number]  HPI DM- chronic problem, on Amaryl, Janumet.  UTD on eye exam, foot exam.  On ARB.  Is having mid morning lows on weekend.  No numbness/tingling of hands/feet.  HTN- chronic problem, on Losartan.  Denies CP, SOB, HAs, visual changes, edema.  Hyperlipidemia- chronic problem, on Lipitor.  Denies abd pain, N/V, myalgias.  Reviewed PMH  Review of Systems For ROS see HPI     Objective:   Physical Exam  Constitutional: She is oriented to person, place, and time. She appears well-developed and well-nourished. No distress.  HENT:  Head: Normocephalic and atraumatic.  Eyes: Conjunctivae and EOM are normal. Pupils are equal, round, and reactive to light.  Neck: Normal range of motion. Neck supple. No thyromegaly present.  Cardiovascular: Normal rate, regular rhythm, normal heart sounds and intact distal pulses.   No murmur heard. Pulmonary/Chest: Effort normal and breath sounds normal. No respiratory distress.  Abdominal: Soft. She exhibits no distension. There is no tenderness.  Musculoskeletal: She exhibits no edema.  Lymphadenopathy:    She has no cervical adenopathy.  Neurological: She is alert and oriented to person, place, and time.  Skin: Skin is warm and dry.  Psychiatric: She has a normal mood and affect. Her behavior is normal.  Vitals reviewed.         Assessment & Plan:   Problem List Items Addressed This Visit    Diabetes mellitus type II, controlled, with no complications - Primary    Chronic problem.  Tolerating meds w/o difficulty w/ exception of intermittent mid-morning lows.  UTD on eye exam, on ARB for renal protection.  Compliant w/ meds.  Check labs.  Adjust meds prn       Relevant Orders   Hemoglobin K3K   Basic metabolic panel   TSH   HYPERCHOLESTEROLEMIA    Chronic problem.  Tolerating statin w/o difficulty.  Check labs.  Adjust meds prn       Relevant Orders   Lipid panel   Hepatic function panel   Essential hypertension    Chronic problem.  Well controlled.  Asymptomatic.  Check labs.  No anticipated med changes.      Relevant Orders   Basic metabolic panel   CBC with Differential/Platelet

## 2014-10-04 NOTE — Patient Instructions (Signed)
Follow up in 3-4 months to recheck diabetes We'll notify you of your lab results and make any changes if needed Keep up the good work!  You look GREAT!!! Take a morning snack with you to prevent lows Call with any questions or concerns Happy New Year!!!

## 2014-10-08 ENCOUNTER — Other Ambulatory Visit: Payer: Self-pay | Admitting: Family Medicine

## 2014-10-09 NOTE — Telephone Encounter (Signed)
Med filled.  

## 2015-01-05 ENCOUNTER — Ambulatory Visit (INDEPENDENT_AMBULATORY_CARE_PROVIDER_SITE_OTHER): Payer: BC Managed Care – PPO | Admitting: Family Medicine

## 2015-01-05 ENCOUNTER — Other Ambulatory Visit: Payer: Self-pay | Admitting: Family Medicine

## 2015-01-05 ENCOUNTER — Encounter: Payer: Self-pay | Admitting: Family Medicine

## 2015-01-05 VITALS — BP 120/80 | HR 68 | Temp 98.1°F | Resp 16 | Wt 165.2 lb

## 2015-01-05 DIAGNOSIS — E119 Type 2 diabetes mellitus without complications: Secondary | ICD-10-CM

## 2015-01-05 LAB — BASIC METABOLIC PANEL
BUN: 21 mg/dL (ref 6–23)
CHLORIDE: 102 meq/L (ref 96–112)
CO2: 30 meq/L (ref 19–32)
Calcium: 9.8 mg/dL (ref 8.4–10.5)
Creatinine, Ser: 0.82 mg/dL (ref 0.40–1.20)
GFR: 76.51 mL/min (ref >60.00)
GLUCOSE: 118 mg/dL — AB (ref 70–99)
POTASSIUM: 4.2 meq/L (ref 3.5–5.1)
SODIUM: 139 meq/L (ref 135–145)

## 2015-01-05 LAB — HEMOGLOBIN A1C: Hgb A1c MFr Bld: 6.7 % — ABNORMAL HIGH (ref 4.6–6.5)

## 2015-01-05 MED ORDER — ATORVASTATIN CALCIUM 10 MG PO TABS: ORAL_TABLET | ORAL | Status: DC

## 2015-01-05 NOTE — Patient Instructions (Signed)
Schedule your complete physical in 3-4 months We'll notify you of your lab results and make any changes if needed Keep up the good work!  You look great! Call with any questions or concerns Happy Spring!!! 

## 2015-01-05 NOTE — Progress Notes (Signed)
Pre visit review using our clinic review tool, if applicable. No additional management support is needed unless otherwise documented below in the visit note. 

## 2015-01-05 NOTE — Progress Notes (Signed)
   Subjective:    Patient ID: Amber Washington, female    DOB: 30-Apr-1956, 59 y.o.   MRN: [id number]  HPI DM- chronic problem.  On Amaryl, Janumet.  On ARB for renal protection.  UTD on eye exam and foot exam.  Home CBGs are 130-150.  Exercising 4x/week, has limited carbs significantly.  Having intermittent symptomatic lows- primarily in the AM.  Denies CP, SOB, HAs, visual changes, edema, abd pain, N/V/D.   Review of Systems For ROS see HPI     Objective:   Physical Exam  Constitutional: She is oriented to person, place, and time. She appears well-developed and well-nourished. No distress.  HENT:  Head: Normocephalic and atraumatic.  Eyes: Conjunctivae and EOM are normal. Pupils are equal, round, and reactive to light.  Neck: Normal range of motion. Neck supple. No thyromegaly present.  Cardiovascular: Normal rate, regular rhythm, normal heart sounds and intact distal pulses.   No murmur heard. Pulmonary/Chest: Effort normal and breath sounds normal. No respiratory distress.  Abdominal: Soft. She exhibits no distension. There is no tenderness.  Musculoskeletal: She exhibits no edema.  Lymphadenopathy:    She has no cervical adenopathy.  Neurological: She is alert and oriented to person, place, and time.  Skin: Skin is warm and dry.  Psychiatric: She has a normal mood and affect. Her behavior is normal.  Vitals reviewed.         Assessment & Plan:

## 2015-01-05 NOTE — Assessment & Plan Note (Signed)
Chronic problem.  Pt has made extensive lifestyle changes- almost completely eliminating carbs and exercising regularly.  Pt is upset b/c home CBGs are still not where she wants them.  Discussed that given her previous pancreas issues that she may be turning into more of a type I than a type II if A1C is higher than previous.  If labs are not at goal, will refer to Endo.  Pt expressed understanding and is in agreement w/ plan.

## 2015-01-05 NOTE — Telephone Encounter (Signed)
Med filled.  

## 2015-01-07 ENCOUNTER — Other Ambulatory Visit: Payer: Self-pay | Admitting: Family Medicine

## 2015-01-07 ENCOUNTER — Other Ambulatory Visit: Payer: Self-pay | Admitting: Pulmonary Disease

## 2015-01-08 ENCOUNTER — Other Ambulatory Visit: Payer: Self-pay | Admitting: General Practice

## 2015-01-08 MED ORDER — SITAGLIP PHOS-METFORMIN HCL ER 100-1000 MG PO TB24: 1.0000 | ORAL_TABLET | Freq: Every day | ORAL | Status: DC

## 2015-01-08 NOTE — Telephone Encounter (Signed)
Med filled and faxed.  

## 2015-01-08 NOTE — Telephone Encounter (Signed)
Last OV 01-05-15 Alprazolam last filled 08-02-14 #90 with 1

## 2015-01-08 NOTE — Telephone Encounter (Signed)
Med needs deferred to PCP, pt has not been seen in over a year.

## 2015-01-11 ENCOUNTER — Other Ambulatory Visit: Payer: Self-pay | Admitting: Family Medicine

## 2015-01-12 NOTE — Telephone Encounter (Signed)
Med filled.  

## 2015-03-15 ENCOUNTER — Telehealth: Payer: Self-pay | Admitting: Family Medicine

## 2015-03-15 ENCOUNTER — Ambulatory Visit (HOSPITAL_BASED_OUTPATIENT_CLINIC_OR_DEPARTMENT_OTHER)
Admission: RE | Admit: 2015-03-15 | Discharge: 2015-03-15 | Disposition: A | Discharge status: Home / Self Care | Payer: BC Managed Care – PPO | Source: Ambulatory Visit | Attending: Family Medicine | Admitting: Family Medicine

## 2015-03-15 ENCOUNTER — Telehealth: Payer: Self-pay

## 2015-03-15 DIAGNOSIS — R51 Headache: Secondary | ICD-10-CM | POA: Insufficient documentation

## 2015-03-15 DIAGNOSIS — S0990XA Unspecified injury of head, initial encounter: Secondary | ICD-10-CM | POA: Insufficient documentation

## 2015-03-15 DIAGNOSIS — G44319 Acute post-traumatic headache, not intractable: Secondary | ICD-10-CM

## 2015-03-15 DIAGNOSIS — X58XXXA Exposure to other specified factors, initial encounter: Secondary | ICD-10-CM | POA: Diagnosis not present

## 2015-03-15 NOTE — Telephone Encounter (Signed)
Pt needs noncontrast head CT to evaluate for possible bleed given ongoing headaches and recent head trauma (please ask pt which side of her head is hurting).  CT needs to be stat w/ call report to the office.  If bleed present, to ER.  If no bleed, I will be happy to see her tomorrow

## 2015-03-15 NOTE — Telephone Encounter (Signed)
Spoke with patient and advised of CT. Patient orders entered. STAT referral.

## 2015-03-15 NOTE — Telephone Encounter (Signed)
error:[id number] ° °

## 2015-03-15 NOTE — Telephone Encounter (Addendum)
Patient hit her head approximately 2 weeks ago. Experienced pain at the site with a slight headache and fatigue. She has bruising at the site, has not spread and the bruising is getting better. [place] the beach [place]. When she arrived on Friday evening she was not feeling well, still experiencing pain and headache. Called in after hours and spoke with [facility] who advised that she be seen for evaluation, which the patient did. She went to [place] Urgent Care [place] the [place]. Provider evaluated site and symptoms, found no remarkable symptoms other than expected. Patient is still have a headache. The headache is in the temple below the injury and is across the forehead now. When ask if she has frequent headaches, states a few but not all the time. Patient responds that this headache is different because she usually has tension headaches. Ask if any other facial pain is notable, "no other pain". Injury to right side of head.   Please Advise.

## 2015-03-15 NOTE — Telephone Encounter (Signed)
Patient will be at 1pm appointment with Dr Birdie Riddle tomorrow.

## 2015-03-16 ENCOUNTER — Ambulatory Visit (INDEPENDENT_AMBULATORY_CARE_PROVIDER_SITE_OTHER): Payer: BC Managed Care – PPO | Admitting: Family Medicine

## 2015-03-16 ENCOUNTER — Encounter: Payer: Self-pay | Admitting: Family Medicine

## 2015-03-16 VITALS — BP 124/82 | HR 87 | Temp 98.0°F | Resp 16 | Wt 167.2 lb

## 2015-03-16 DIAGNOSIS — G44319 Acute post-traumatic headache, not intractable: Secondary | ICD-10-CM

## 2015-03-16 DIAGNOSIS — G44309 Post-traumatic headache, unspecified, not intractable: Secondary | ICD-10-CM | POA: Insufficient documentation

## 2015-03-16 NOTE — Progress Notes (Signed)
Pre visit review using our clinic review tool, if applicable. No additional management support is needed unless otherwise documented below in the visit note. 

## 2015-03-16 NOTE — Assessment & Plan Note (Signed)
New to provider.  Pt is asymptomatic today.  Reviewed CT scan w/ her that was done yesterday- no evidence of hematoma or ongoing bleed.  Based on description of fatigue, HAs w/ exertion, I suspect pt has a concussion.  Reviewed supportive measures including both physical and cognitive rest.  Discussed risk factors that should prompt return.  Pt expressed understanding and is in agreement w/ plan.

## 2015-03-16 NOTE — Patient Instructions (Signed)
Follow up as scheduled Try and continue physical and cognitive rest! Ease back into your routine- baby steps!  Listen to your body, if any particular activity causes worsening headaches, back down to previous level of activity Drink plenty of fluids REST!!!! Tylenol or ibuprofen only as needed for headache Call with any questions or concerns Hang in there!!!

## 2015-03-16 NOTE — Progress Notes (Signed)
   Subjective:    Patient ID: Amber Washington, female    DOB: 30-Nov-1955, 59 y.o.   MRN: [id number]  HPI Head injury- pt was climbing ladder and hit top of R side of head on door hinge when painting 2 weeks ago.  Did not get evaluated at time of injury.  [place] 1 [place] when bruising appeared.  Pt had normal neuro exam at time of evaluation.  Was told to take 600mg  ibuprofen.  Pt reports ongoing fatigue and mild HA.  Pt had severe HA when she woke yesterday and 'it stayed all day'.  Based on this, had non contrast head CT to r/o bleed.  Currently no HA, no dizziness, no N/V, no visual changes,   Review of Systems For ROS see HPI     Objective:   Physical Exam  Constitutional: She is oriented to person, place, and time. She appears well-developed and well-nourished. No distress.  HENT:  Head: Normocephalic and atraumatic.  TMs WNL No TTP over sinuses Minimal nasal congestion  Eyes: Conjunctivae and EOM are normal. Pupils are equal, round, and reactive to light.  Neck: Normal range of motion. Neck supple.  Cardiovascular: Normal rate, regular rhythm, normal heart sounds and intact distal pulses.   Pulmonary/Chest: Effort normal and breath sounds normal. No respiratory distress. She has no wheezes. She has no rales.  Lymphadenopathy:    She has no cervical adenopathy.  Neurological: She is alert and oriented to person, place, and time. She has normal reflexes. No cranial nerve deficit. Coordination normal.  Psychiatric: She has a normal mood and affect. Her behavior is normal. Judgment and thought content normal.  Vitals reviewed.         Assessment & Plan:

## 2015-04-09 ENCOUNTER — Other Ambulatory Visit: Payer: Self-pay

## 2015-04-09 DIAGNOSIS — Z1231 Encounter for screening mammogram for malignant neoplasm of breast: Secondary | ICD-10-CM

## 2015-04-10 ENCOUNTER — Other Ambulatory Visit: Payer: Self-pay | Admitting: Family Medicine

## 2015-04-11 ENCOUNTER — Other Ambulatory Visit: Payer: Self-pay | Admitting: General Practice

## 2015-04-11 MED ORDER — ALPRAZOLAM 0.5 MG PO TABS: ORAL_TABLET | ORAL | Status: DC

## 2015-04-11 NOTE — Telephone Encounter (Signed)
Medication filled to pharmacy as requested.   

## 2015-04-11 NOTE — Telephone Encounter (Signed)
Last OV 03/16/15 Alprazolam last filled 01/08/15 #90 with 0

## 2015-04-26 ENCOUNTER — Ambulatory Visit
Admission: RE | Admit: 2015-04-26 | Discharge: 2015-04-26 | Disposition: A | Discharge status: Home / Self Care | Payer: BC Managed Care – PPO | Source: Ambulatory Visit

## 2015-04-26 DIAGNOSIS — Z1231 Encounter for screening mammogram for malignant neoplasm of breast: Secondary | ICD-10-CM

## 2015-05-18 LAB — HM DIABETES EYE EXAM

## 2015-05-22 ENCOUNTER — Encounter: Payer: Self-pay | Admitting: General Practice

## 2015-06-21 ENCOUNTER — Encounter: Payer: Self-pay | Admitting: Family Medicine

## 2015-06-21 ENCOUNTER — Ambulatory Visit (INDEPENDENT_AMBULATORY_CARE_PROVIDER_SITE_OTHER): Payer: BC Managed Care – PPO | Admitting: Family Medicine

## 2015-06-21 VITALS — BP 122/80 | HR 73 | Temp 98.0°F | Resp 16 | Ht 69.0 in | Wt 166.5 lb

## 2015-06-21 DIAGNOSIS — N390 Urinary tract infection, site not specified: Secondary | ICD-10-CM

## 2015-06-21 DIAGNOSIS — E119 Type 2 diabetes mellitus without complications: Secondary | ICD-10-CM

## 2015-06-21 DIAGNOSIS — R82998 Other abnormal findings in urine: Secondary | ICD-10-CM

## 2015-06-21 DIAGNOSIS — R319 Hematuria, unspecified: Secondary | ICD-10-CM

## 2015-06-21 DIAGNOSIS — Z0000 Encounter for general adult medical examination without abnormal findings: Secondary | ICD-10-CM | POA: Diagnosis not present

## 2015-06-21 LAB — BASIC METABOLIC PANEL
BUN: 21 mg/dL (ref 6–23)
CALCIUM: 9.8 mg/dL (ref 8.4–10.5)
CO2: 34 mEq/L — ABNORMAL HIGH (ref 19–32)
CREATININE: 0.87 mg/dL (ref 0.40–1.20)
Chloride: 102 mEq/L (ref 96–112)
GFR: 71.34 mL/min (ref >60.00)
Glucose, Bld: 128 mg/dL — ABNORMAL HIGH (ref 70–99)
Potassium: 4.3 mEq/L (ref 3.5–5.1)
Sodium: 142 mEq/L (ref 135–145)

## 2015-06-21 LAB — HEPATIC FUNCTION PANEL
ALBUMIN: 4.2 g/dL (ref 3.5–5.2)
ALT: 15 U/L (ref 0–35)
AST: 16 U/L (ref 0–37)
Alkaline Phosphatase: 74 U/L (ref 39–117)
Bilirubin, Direct: 0.1 mg/dL (ref 0.0–0.3)
Total Bilirubin: 0.7 mg/dL (ref 0.2–1.2)
Total Protein: 7.6 g/dL (ref 6.0–8.3)

## 2015-06-21 LAB — CBC WITH DIFFERENTIAL/PLATELET
Basophils Absolute: 0 10*3/uL (ref 0.0–0.1)
Basophils Relative: 0.5 % (ref 0.0–3.0)
EOS PCT: 3 % (ref 0.0–5.0)
Eosinophils Absolute: 0.2 10*3/uL (ref 0.0–0.7)
HCT: 38.8 % (ref 36.0–46.0)
HEMOGLOBIN: 13 g/dL (ref 12.0–15.0)
Lymphocytes Relative: 29.4 % (ref 12.0–46.0)
Lymphs Abs: 2.2 10*3/uL (ref 0.7–4.0)
MCHC: 33.5 g/dL (ref 30.0–36.0)
MCV: 87.6 fl (ref 78.0–100.0)
MONOS PCT: 11.6 % (ref 3.0–12.0)
Monocytes Absolute: 0.9 10*3/uL (ref 0.1–1.0)
Neutro Abs: 4.1 10*3/uL (ref 1.4–7.7)
Neutrophils Relative %: 55.5 % (ref 43.0–77.0)
Platelets: 337 10*3/uL (ref 150.0–400.0)
RBC: 4.43 Mil/uL (ref 3.87–5.11)
RDW: 13.1 % (ref 11.5–15.5)
WBC: 7.5 10*3/uL (ref 4.0–10.5)

## 2015-06-21 LAB — LIPID PANEL
CHOL/HDL RATIO: 3
Cholesterol: 163 mg/dL (ref 0–200)
HDL: 53.4 mg/dL (ref >39.00)
LDL CALC: 95 mg/dL (ref 0–99)
NonHDL: 109.78
TRIGLYCERIDES: 72 mg/dL (ref 0.0–149.0)
VLDL: 14.4 mg/dL (ref 0.0–40.0)

## 2015-06-21 LAB — POCT URINALYSIS DIPSTICK
BILIRUBIN UA: NEGATIVE
Glucose, UA: NEGATIVE
KETONES UA: NEGATIVE
Nitrite, UA: NEGATIVE
PH UA: 6
PROTEIN UA: NEGATIVE
Spec Grav, UA: 1.02
Urobilinogen, UA: 0.2

## 2015-06-21 LAB — VITAMIN D 25 HYDROXY (VIT D DEFICIENCY, FRACTURES): VITD: 56.55 ng/mL (ref 30.00–100.00)

## 2015-06-21 LAB — TSH: TSH: 0.94 u[IU]/mL (ref 0.35–4.50)

## 2015-06-21 LAB — HEMOGLOBIN A1C: Hgb A1c MFr Bld: 6.7 % — ABNORMAL HIGH (ref 4.6–6.5)

## 2015-06-21 NOTE — Assessment & Plan Note (Signed)
Pt's PE WNL.  UTD on GYN, colonoscopy.  Check labs.  Anticipatory guidance provided.  

## 2015-06-21 NOTE — Progress Notes (Signed)
   Subjective:    Patient ID: Amber Washington, female    DOB: 1956/02/11, 59 y.o.   MRN: [id number]  HPI CPE- UTD on GYN ([facility]), Colonoscopy ([facility])  DM- chronic problem, on Glimepiride, Janumet.  On ARB for renal protection.  UTD on eye exam, foot exam.  CBGs 'have been high'- 225 this AM.     Review of Systems Patient reports no hearing changes, adenopathy,fever, weight change,  persistant/recurrent hoarseness , swallowing issues, chest pain, palpitations, edema, persistant/recurrent cough, hemoptysis, dyspnea (rest/exertional/paroxysmal nocturnal), gastrointestinal bleeding (melena, rectal bleeding), abdominal pain, significant heartburn, bowel changes, GU symptoms (dysuria, hematuria, incontinence), Gyn symptoms (abnormal  bleeding, pain),  syncope, focal weakness, memory loss, numbness & tingling, skin/hair/nail changes, abnormal bruising or bleeding, anxiety, or depression.     Objective:   Physical Exam General Appearance:    Alert, cooperative, no distress, appears stated age  Head:    Normocephalic, without obvious abnormality, atraumatic  Eyes:    PERRL, conjunctiva/corneas clear, EOM's intact, fundi    benign, both eyes  Ears:    Normal TM's and external ear canals, both ears  Nose:   Nares normal, septum midline, mucosa normal, no drainage    or sinus tenderness  Throat:   Lips, mucosa, and tongue normal; teeth and gums normal  Neck:   Supple, symmetrical, trachea midline, no adenopathy;    Thyroid: no enlargement/tenderness/nodules  Back:     Symmetric, no curvature, ROM normal, no CVA tenderness  Lungs:     Clear to auscultation bilaterally, respirations unlabored  Chest Wall:    No tenderness or deformity   Heart:    Regular rate and rhythm, S1 and S2 normal, no murmur, rub   or gallop  Breast Exam:    Deferred to GYN  Abdomen:     Soft, non-tender, bowel sounds active all four quadrants,    no masses, no organomegaly  Genitalia:    Deferred to GYN  Rectal:      Extremities:   Extremities normal, atraumatic, no cyanosis or edema  Pulses:   2+ and symmetric all extremities  Skin:   Skin color, texture, turgor normal, no rashes or lesions  Lymph nodes:   Cervical, supraclavicular, and axillary nodes normal  Neurologic:   CNII-XII intact, normal strength, sensation and reflexes    throughout          Assessment & Plan:

## 2015-06-21 NOTE — Patient Instructions (Signed)
Follow up in 3-4 months to recheck diabetes We'll notify you of your lab results and make any changes if needed Keep up the good work on healthy diet and regular exercise- you look great! Call Dr [facility] office to determine when he wants you to f/u for colonoscopy Call with any questions or concerns If you want to join [place] at the new [place] office, any scheduled appointments will automatically transfer and we will see you at [address] [place] [address] [place], [place] [address]  Happy Early Rudene Anda!!!

## 2015-06-21 NOTE — Assessment & Plan Note (Signed)
Chronic problem resulting from pt's pancreatic surgery.  Pt reports CBGs have been high lately despite not changing her diet.  She admits that she has not been exercising regularly.  UTD on eye exam, foot exam.  On ARB for renal protection.  Check labs.  Adjust meds prn

## 2015-06-21 NOTE — Progress Notes (Signed)
Pre visit review using our clinic review tool, if applicable. No additional management support is needed unless otherwise documented below in the visit note. 

## 2015-06-22 LAB — URINE CULTURE
COLONY COUNT: NO GROWTH
ORGANISM ID, BACTERIA: NO GROWTH

## 2015-06-25 ENCOUNTER — Other Ambulatory Visit: Payer: Self-pay | Admitting: Family Medicine

## 2015-06-25 DIAGNOSIS — R319 Hematuria, unspecified: Secondary | ICD-10-CM

## 2015-06-27 LAB — HM COLONOSCOPY

## 2015-07-02 ENCOUNTER — Encounter: Payer: Self-pay | Admitting: Physician Assistant

## 2015-07-02 ENCOUNTER — Other Ambulatory Visit (INDEPENDENT_AMBULATORY_CARE_PROVIDER_SITE_OTHER): Payer: BC Managed Care – PPO

## 2015-07-02 DIAGNOSIS — R8290 Unspecified abnormal findings in urine: Secondary | ICD-10-CM | POA: Diagnosis not present

## 2015-07-02 DIAGNOSIS — R319 Hematuria, unspecified: Secondary | ICD-10-CM

## 2015-07-02 LAB — POCT URINALYSIS DIPSTICK
Bilirubin, UA: NEGATIVE
Glucose, UA: NEGATIVE
KETONES UA: NEGATIVE
LEUKOCYTES UA: NEGATIVE
Nitrite, UA: NEGATIVE
PH UA: 5.5
PROTEIN UA: NEGATIVE
Urobilinogen, UA: 0.2

## 2015-07-03 ENCOUNTER — Telehealth: Payer: Self-pay | Admitting: General Practice

## 2015-07-03 ENCOUNTER — Encounter: Payer: Self-pay | Admitting: Family Medicine

## 2015-07-03 DIAGNOSIS — R319 Hematuria, unspecified: Secondary | ICD-10-CM

## 2015-07-03 LAB — URINE CULTURE
Colony Count: NO GROWTH
Organism ID, Bacteria: NO GROWTH

## 2015-07-03 NOTE — Telephone Encounter (Signed)
Pt called in, she says that she received the message from the provider and would like for you to go ahead and put in referral.

## 2015-07-03 NOTE — Telephone Encounter (Signed)
Pt returned cal about recent results. Due to this she would like a referral to urology for her hematuria. This referral was placed today.

## 2015-07-11 ENCOUNTER — Encounter: Payer: Self-pay | Admitting: Family Medicine

## 2015-07-23 ENCOUNTER — Other Ambulatory Visit: Payer: Self-pay | Admitting: Family Medicine

## 2015-07-24 NOTE — Telephone Encounter (Signed)
Medication filled to pharmacy as requested.   

## 2015-08-09 NOTE — Telephone Encounter (Signed)
Patient has appt on 08/29/15 [facility]

## 2015-08-10 ENCOUNTER — Other Ambulatory Visit: Payer: Self-pay | Admitting: General Practice

## 2015-08-10 MED ORDER — ALPRAZOLAM 0.5 MG PO TABS: ORAL_TABLET | ORAL | Status: DC

## 2015-08-10 NOTE — Telephone Encounter (Signed)
Medication filled to pharmacy as requested.   

## 2015-08-10 NOTE — Telephone Encounter (Signed)
Last OV 06-21-15 Alprazolam last filled 04-11-15 #90 with 0

## 2015-08-13 ENCOUNTER — Encounter: Payer: Self-pay | Admitting: Family Medicine

## 2015-08-15 ENCOUNTER — Other Ambulatory Visit: Payer: Self-pay | Admitting: Family Medicine

## 2015-08-15 NOTE — Telephone Encounter (Signed)
Medication filled to pharmacy as requested.   

## 2015-09-25 ENCOUNTER — Encounter: Payer: Self-pay | Admitting: Family Medicine

## 2015-09-25 ENCOUNTER — Ambulatory Visit (INDEPENDENT_AMBULATORY_CARE_PROVIDER_SITE_OTHER): Payer: BC Managed Care – PPO | Admitting: Family Medicine

## 2015-09-25 VITALS — BP 124/82 | HR 75 | Temp 97.9°F | Ht 68.0 in | Wt 171.8 lb

## 2015-09-25 DIAGNOSIS — E119 Type 2 diabetes mellitus without complications: Secondary | ICD-10-CM

## 2015-09-25 LAB — BASIC METABOLIC PANEL
BUN: 24 mg/dL — ABNORMAL HIGH (ref 6–23)
CHLORIDE: 100 meq/L (ref 96–112)
CO2: 32 meq/L (ref 19–32)
Calcium: 9.7 mg/dL (ref 8.4–10.5)
Creatinine, Ser: 0.91 mg/dL (ref 0.40–1.20)
GFR: 67.67 mL/min (ref >60.00)
Glucose, Bld: 135 mg/dL — ABNORMAL HIGH (ref 70–99)
POTASSIUM: 4.4 meq/L (ref 3.5–5.1)
Sodium: 138 mEq/L (ref 135–145)

## 2015-09-25 LAB — HEMOGLOBIN A1C: HEMOGLOBIN A1C: 7.2 % — AB (ref 4.6–6.5)

## 2015-09-25 NOTE — Patient Instructions (Signed)
Follow up in 3-4 months to recheck diabetes and cholesterol We'll notify you of your lab results and make any changes if needed Keep up the good work on healthy diet and regular exercise- you look great!! Call with any questions or concerns Happy New Year!!!

## 2015-09-25 NOTE — Progress Notes (Signed)
Pre visit review using our clinic review tool, if applicable. No additional management support is needed unless otherwise documented below in the visit note. 

## 2015-09-25 NOTE — Progress Notes (Signed)
   Subjective:    Patient ID: Amber Washington, female    DOB: [birth year], 58 y.o.   MRN: [id number]  HPI DM- chronic problem, on Janumet XR, Glimepiride.  On ARB for renal protection.  UTD on eye exam, due for foot exam.  Pt has gained 6 lbs over the holidays.  Plans to resume exercising.  Denies symptomatic lows.  No numbness/tingling of hands/feet.  Declines flu shot.  Denies CP, SOB, HAs, visual changes, edema, abd pain, N/V.   Review of Systems For ROS see HPI     Objective:   Physical Exam  Constitutional: She is oriented to person, place, and time. She appears well-developed and well-nourished. No distress.  HENT:  Head: Normocephalic and atraumatic.  Eyes: Conjunctivae and EOM are normal. Pupils are equal, round, and reactive to light.  Neck: Normal range of motion. Neck supple. No thyromegaly present.  Cardiovascular: Normal rate, regular rhythm, normal heart sounds and intact distal pulses.   No murmur heard. Pulmonary/Chest: Effort normal and breath sounds normal. No respiratory distress.  Abdominal: Soft. She exhibits no distension. There is no tenderness.  Musculoskeletal: She exhibits no edema.  Lymphadenopathy:    She has no cervical adenopathy.  Neurological: She is alert and oriented to person, place, and time.  Skin: Skin is warm and dry.  Psychiatric: She has a normal mood and affect. Her behavior is normal.  Vitals reviewed.         Assessment & Plan:

## 2015-09-25 NOTE — Assessment & Plan Note (Signed)
Chronic problem.  Asymptomatic.  UTD on eye exam.  Foot exam done today.  On ARB for renal protection.  Stressed need for healthy diet and regular exercise.  Check labs.  Adjust meds prn

## 2015-10-23 ENCOUNTER — Encounter: Payer: Self-pay | Admitting: Family Medicine

## 2015-10-23 ENCOUNTER — Encounter: Payer: Self-pay | Admitting: General Practice

## 2015-10-23 MED ORDER — GLIMEPIRIDE 1 MG PO TABS: 1.0000 mg | ORAL_TABLET | Freq: Every morning | ORAL | Status: DC

## 2015-10-23 MED ORDER — LOSARTAN POTASSIUM 50 MG PO TABS: 50.0000 mg | ORAL_TABLET | Freq: Every day | ORAL | Status: DC

## 2015-10-23 MED ORDER — ALPRAZOLAM 0.5 MG PO TABS: ORAL_TABLET | ORAL | Status: DC

## 2015-10-23 MED ORDER — ATORVASTATIN CALCIUM 10 MG PO TABS: 10.0000 mg | ORAL_TABLET | Freq: Every day | ORAL | Status: DC

## 2015-10-23 MED ORDER — MONTELUKAST SODIUM 10 MG PO TABS: 10.0000 mg | ORAL_TABLET | Freq: Every day | ORAL | Status: DC

## 2015-10-23 NOTE — Telephone Encounter (Signed)
Willing to do #90 w/ 3 refills to mail order

## 2015-10-23 NOTE — Telephone Encounter (Signed)
Pt would like to receive Alprazolam by mail order  Last OV 09/25/15 Alprazolam last filled 08/10/15 #90 with 0  Pt sig is to take 3 tablets by mouth daily. That would be #270 to mail order.

## 2015-10-23 NOTE — Telephone Encounter (Signed)
Medication filled to pharmacy as requested.   

## 2015-11-22 ENCOUNTER — Encounter: Payer: Self-pay | Admitting: Family Medicine

## 2015-11-22 ENCOUNTER — Ambulatory Visit (INDEPENDENT_AMBULATORY_CARE_PROVIDER_SITE_OTHER): Payer: BC Managed Care – PPO | Admitting: Family Medicine

## 2015-11-22 VITALS — BP 120/84 | HR 96 | Temp 98.1°F | Resp 17 | Wt 166.2 lb

## 2015-11-22 DIAGNOSIS — J4541 Moderate persistent asthma with (acute) exacerbation: Secondary | ICD-10-CM

## 2015-11-22 DIAGNOSIS — J45909 Unspecified asthma, uncomplicated: Secondary | ICD-10-CM | POA: Insufficient documentation

## 2015-11-22 MED ORDER — BENZONATATE 200 MG PO CAPS: 200.0000 mg | ORAL_CAPSULE | Freq: Two times a day (BID) | ORAL | Status: DC | PRN

## 2015-11-22 MED ORDER — GUAIFENESIN-CODEINE 100-10 MG/5ML PO SYRP: 10.0000 mL | ORAL_SOLUTION | Freq: Three times a day (TID) | ORAL | Status: DC | PRN

## 2015-11-22 MED ORDER — AZITHROMYCIN 250 MG PO TABS: ORAL_TABLET | ORAL | Status: DC

## 2015-11-22 MED FILL — BENZONATATE 200 MG CAPSULE: 200 | 10 days supply | Qty: 20 | Fill #0

## 2015-11-22 MED FILL — CHERATUSSIN AC SYRUP: 100-10 | 8 days supply | Qty: 240 | Fill #0

## 2015-11-22 MED FILL — AZITHROMYCIN 250 MG TABLET: 250 | 5 days supply | Qty: 6 | Fill #0

## 2015-11-22 NOTE — Assessment & Plan Note (Signed)
Pt's sxs and PE consistent w/ infxn.  Start abx.  Cough meds prn.  Reviewed supportive care and red flags that should prompt return.  Pt expressed understanding and is in agreement w/ plan.  

## 2015-11-22 NOTE — Progress Notes (Signed)
Pre visit review using our clinic review tool, if applicable. No additional management support is needed unless otherwise documented below in the visit note. 

## 2015-11-22 NOTE — Patient Instructions (Signed)
Follow up as scheduled or as needed Start the Zpack as directed Use the tessalon cough pills for daytime cough- can be used in combination w/ Mucinex DM Use the cough syrup as needed for nights/weekends- will cause drowsiness Drink plenty of fluids REST!!! If you want to join [place] at the new [place] office, any scheduled appointments will automatically transfer and we will see you at [address] [place] [address] [place], [place] [address] ([facility] 3/23) [place] in there!!!

## 2015-11-22 NOTE — Progress Notes (Signed)
   Subjective:    Patient ID: Amber Washington, female    DOB: [birth year], 58 y.o.   MRN: [id number]  HPI URI- sxs started Friday w/ nasal congestion but 'it's now in my chest'.  Cough is productive of yellow sputum.  Nasal drainage is clear.  Initial sinus pressure resolved on Sunday.  No current sinus pain.  + fatigue.  + PND, chest congestion, hoarseness, HA.  Denies SOB, wheezing, body aches, dizziness, fever, ear pain.  + sick contacts.  Using OTC cold meds w/ some improvement.  Has not required albuterol.   Review of Systems For ROS see HPI     Objective:   Physical Exam  Constitutional: She appears well-developed and well-nourished. No distress.  HENT:  Head: Normocephalic and atraumatic.  TMs normal bilaterally Mild nasal congestion Throat w/out erythema, edema, or exudate  Eyes: Conjunctivae and EOM are normal. Pupils are equal, round, and reactive to light.  Neck: Normal range of motion. Neck supple.  Cardiovascular: Normal rate, regular rhythm, normal heart sounds and intact distal pulses.   No murmur heard. Pulmonary/Chest: Effort normal and breath sounds normal. No respiratory distress. She has no wheezes.  + hacking cough  Lymphadenopathy:    She has no cervical adenopathy.  Vitals reviewed.         Assessment & Plan:

## 2016-01-08 ENCOUNTER — Ambulatory Visit (INDEPENDENT_AMBULATORY_CARE_PROVIDER_SITE_OTHER): Payer: BC Managed Care – PPO | Admitting: Family Medicine

## 2016-01-08 ENCOUNTER — Other Ambulatory Visit (INDEPENDENT_AMBULATORY_CARE_PROVIDER_SITE_OTHER): Payer: BC Managed Care – PPO

## 2016-01-08 ENCOUNTER — Encounter: Payer: Self-pay | Admitting: Family Medicine

## 2016-01-08 VITALS — BP 118/82 | HR 72 | Temp 98.1°F | Resp 16 | Ht 68.0 in | Wt 170.2 lb

## 2016-01-08 DIAGNOSIS — E119 Type 2 diabetes mellitus without complications: Secondary | ICD-10-CM | POA: Diagnosis not present

## 2016-01-08 DIAGNOSIS — E7800 Pure hypercholesterolemia, unspecified: Secondary | ICD-10-CM

## 2016-01-08 DIAGNOSIS — I10 Essential (primary) hypertension: Secondary | ICD-10-CM | POA: Diagnosis not present

## 2016-01-08 NOTE — Assessment & Plan Note (Signed)
Chronic problem.  UTD on foot exam, eye exam, on ARB for renal protection.  Asymptomatic.  Stressed need for healthy diet and regular exercise.  Check labs.  Adjust meds prn

## 2016-01-08 NOTE — Assessment & Plan Note (Signed)
Chronic problem.  Tolerating statin w/o difficulty.  Check labs.  Adjust meds prn  

## 2016-01-08 NOTE — Assessment & Plan Note (Signed)
Chronic problem.  Adequate control on current meds.  Asymptomatic.  Check labs.  No anticipated med changes. 

## 2016-01-08 NOTE — Progress Notes (Signed)
Pre visit review using our clinic review tool, if applicable. No additional management support is needed unless otherwise documented below in the visit note. 

## 2016-01-08 NOTE — Progress Notes (Signed)
   Subjective:    Patient ID: Amber Washington, female    DOB: [birth year], 58 y.o.   MRN: [id number]  HPI HTN- chronic problem, on Losartan w/ good BP control.  Denies CP, SOB, HAs, visual changes, edema.  Hyperlipidemia- chronic problem, on Lipitor.  Denies abd pain, N/V, myalgias.  DM- chronic problem, UTD on foot exam and eye exam.  On ARB for renal protection.  On Amaryl and Janumet daily.  Denies symptomatic lows.  No numbness/tingling of hands/feet.     Review of Systems For ROS see HPI     Objective:   Physical Exam  Constitutional: She is oriented to person, place, and time. She appears well-developed and well-nourished. No distress.  HENT:  Head: Normocephalic and atraumatic.  Eyes: Conjunctivae and EOM are normal. Pupils are equal, round, and reactive to light.  Neck: Normal range of motion. Neck supple. No thyromegaly present.  Cardiovascular: Normal rate, regular rhythm, normal heart sounds and intact distal pulses.   No murmur heard. Pulmonary/Chest: Effort normal and breath sounds normal. No respiratory distress.  Abdominal: Soft. She exhibits no distension. There is no tenderness.  Musculoskeletal: She exhibits no edema.  Lymphadenopathy:    She has no cervical adenopathy.  Neurological: She is alert and oriented to person, place, and time.  Skin: Skin is warm and dry.  Psychiatric: She has a normal mood and affect. Her behavior is normal.  Vitals reviewed.         Assessment & Plan:

## 2016-01-08 NOTE — Patient Instructions (Signed)
Schedule your complete physical for after Oct 13th Go to [facility] and get your labs done Continue to work on healthy diet and regular exercise- you look great!! Call with any questions or concerns or concerns Thanks for sticking with [place]!!! Happy Spring!!!

## 2016-01-09 ENCOUNTER — Encounter: Payer: Self-pay | Admitting: Family Medicine

## 2016-01-09 ENCOUNTER — Other Ambulatory Visit: Payer: Self-pay | Admitting: General Practice

## 2016-01-09 LAB — CBC WITH DIFFERENTIAL/PLATELET
BASOS PCT: 0.6 % (ref 0.0–3.0)
Basophils Absolute: 0.1 10*3/uL (ref 0.0–0.1)
EOS PCT: 4.6 % (ref 0.0–5.0)
Eosinophils Absolute: 0.4 10*3/uL (ref 0.0–0.7)
HEMATOCRIT: 36.9 % (ref 36.0–46.0)
HEMOGLOBIN: 12.3 g/dL (ref 12.0–15.0)
LYMPHS PCT: 32.1 % (ref 12.0–46.0)
Lymphs Abs: 2.9 10*3/uL (ref 0.7–4.0)
MCHC: 33.2 g/dL (ref 30.0–36.0)
MCV: 87.1 fl (ref 78.0–100.0)
MONO ABS: 0.9 10*3/uL (ref 0.1–1.0)
MONOS PCT: 10.3 % (ref 3.0–12.0)
Neutro Abs: 4.7 10*3/uL (ref 1.4–7.7)
Neutrophils Relative %: 52.4 % (ref 43.0–77.0)
Platelets: 342 10*3/uL (ref 150.0–400.0)
RBC: 4.24 Mil/uL (ref 3.87–5.11)
RDW: 14.1 % (ref 11.5–15.5)
WBC: 9.1 10*3/uL (ref 4.0–10.5)

## 2016-01-09 LAB — BASIC METABOLIC PANEL
BUN: 25 mg/dL — AB (ref 6–23)
CO2: 30 mEq/L (ref 19–32)
CREATININE: 0.97 mg/dL (ref 0.40–1.20)
Calcium: 9.7 mg/dL (ref 8.4–10.5)
Chloride: 102 mEq/L (ref 96–112)
GFR: 62.8 mL/min (ref >60.00)
Glucose, Bld: 133 mg/dL — ABNORMAL HIGH (ref 70–99)
Potassium: 4.5 mEq/L (ref 3.5–5.1)
Sodium: 140 mEq/L (ref 135–145)

## 2016-01-09 LAB — HEMOGLOBIN A1C: Hgb A1c MFr Bld: 7.5 % — ABNORMAL HIGH (ref 4.6–6.5)

## 2016-01-09 LAB — HEPATIC FUNCTION PANEL
ALBUMIN: 4.2 g/dL (ref 3.5–5.2)
ALT: 14 U/L (ref 0–35)
AST: 17 U/L (ref 0–37)
Alkaline Phosphatase: 78 U/L (ref 39–117)
BILIRUBIN TOTAL: 0.3 mg/dL (ref 0.2–1.2)
Bilirubin, Direct: 0.1 mg/dL (ref 0.0–0.3)
Total Protein: 7.5 g/dL (ref 6.0–8.3)

## 2016-01-09 LAB — LIPID PANEL
CHOL/HDL RATIO: 4
Cholesterol: 165 mg/dL (ref 0–200)
HDL: 46.5 mg/dL (ref >39.00)
LDL CALC: 81 mg/dL (ref 0–99)
NONHDL: 118.19
TRIGLYCERIDES: 184 mg/dL — AB (ref 0.0–149.0)
VLDL: 36.8 mg/dL (ref 0.0–40.0)

## 2016-01-09 LAB — TSH: TSH: 0.9 u[IU]/mL (ref 0.35–4.50)

## 2016-01-10 ENCOUNTER — Other Ambulatory Visit: Payer: Self-pay | Admitting: General Practice

## 2016-01-10 MED ORDER — EMPAGLIFLOZIN 10 MG PO TABS: 10.0000 mg | ORAL_TABLET | Freq: Every day | ORAL | Status: DC

## 2016-04-02 ENCOUNTER — Other Ambulatory Visit: Payer: Self-pay | Admitting: Family Medicine

## 2016-04-02 NOTE — Telephone Encounter (Signed)
Medication filled to pharmacy as requested.   

## 2016-04-29 ENCOUNTER — Other Ambulatory Visit: Payer: Self-pay | Admitting: Family Medicine

## 2016-05-05 ENCOUNTER — Encounter: Payer: Self-pay | Admitting: Family Medicine

## 2016-05-06 MED ORDER — ATORVASTATIN CALCIUM 10 MG PO TABS: 10.0000 mg | ORAL_TABLET | Freq: Every day | ORAL | 1 refills | Status: DC

## 2016-05-06 MED ORDER — ALPRAZOLAM 0.5 MG PO TABS: ORAL_TABLET | ORAL | 3 refills | Status: DC

## 2016-05-13 ENCOUNTER — Other Ambulatory Visit: Payer: Self-pay | Admitting: Gynecology

## 2016-05-13 DIAGNOSIS — Z1231 Encounter for screening mammogram for malignant neoplasm of breast: Secondary | ICD-10-CM

## 2016-05-27 LAB — HM DIABETES EYE EXAM

## 2016-05-28 ENCOUNTER — Ambulatory Visit
Admission: RE | Admit: 2016-05-28 | Discharge: 2016-05-28 | Disposition: A | Discharge status: Home / Self Care | Payer: BC Managed Care – PPO | Source: Ambulatory Visit | Attending: Gynecology | Admitting: Gynecology

## 2016-05-28 DIAGNOSIS — Z1231 Encounter for screening mammogram for malignant neoplasm of breast: Secondary | ICD-10-CM

## 2016-05-30 LAB — HM MAMMOGRAPHY: HM MAMMO: NORMAL (ref 0–4)

## 2016-06-19 LAB — HM PAP SMEAR

## 2016-06-26 ENCOUNTER — Encounter: Payer: Self-pay | Admitting: Family Medicine

## 2016-06-26 ENCOUNTER — Ambulatory Visit (INDEPENDENT_AMBULATORY_CARE_PROVIDER_SITE_OTHER): Payer: BC Managed Care – PPO | Admitting: Family Medicine

## 2016-06-26 VITALS — BP 116/76 | HR 75 | Temp 98.1°F | Resp 16 | Ht 68.0 in | Wt 146.1 lb

## 2016-06-26 DIAGNOSIS — Z0000 Encounter for general adult medical examination without abnormal findings: Secondary | ICD-10-CM | POA: Diagnosis not present

## 2016-06-26 DIAGNOSIS — G4700 Insomnia, unspecified: Secondary | ICD-10-CM | POA: Diagnosis not present

## 2016-06-26 DIAGNOSIS — Z23 Encounter for immunization: Secondary | ICD-10-CM

## 2016-06-26 DIAGNOSIS — E119 Type 2 diabetes mellitus without complications: Secondary | ICD-10-CM | POA: Diagnosis not present

## 2016-06-26 LAB — TSH: TSH: 1.71 u[IU]/mL (ref 0.35–4.50)

## 2016-06-26 LAB — BASIC METABOLIC PANEL
BUN: 25 mg/dL — AB (ref 6–23)
CHLORIDE: 101 meq/L (ref 96–112)
CO2: 33 mEq/L — ABNORMAL HIGH (ref 19–32)
Calcium: 10.1 mg/dL (ref 8.4–10.5)
Creatinine, Ser: 0.84 mg/dL (ref 0.40–1.20)
GFR: 74.02 mL/min (ref >60.00)
Glucose, Bld: 126 mg/dL — ABNORMAL HIGH (ref 70–99)
POTASSIUM: 4.7 meq/L (ref 3.5–5.1)
SODIUM: 141 meq/L (ref 135–145)

## 2016-06-26 LAB — CBC WITH DIFFERENTIAL/PLATELET
BASOS PCT: 0.6 % (ref 0.0–3.0)
Basophils Absolute: 0 10*3/uL (ref 0.0–0.1)
EOS ABS: 0.3 10*3/uL (ref 0.0–0.7)
EOS PCT: 4.3 % (ref 0.0–5.0)
HCT: 40.8 % (ref 36.0–46.0)
HEMOGLOBIN: 13.3 g/dL (ref 12.0–15.0)
LYMPHS ABS: 2.4 10*3/uL (ref 0.7–4.0)
Lymphocytes Relative: 30.5 % (ref 12.0–46.0)
MCHC: 32.5 g/dL (ref 30.0–36.0)
MCV: 88.7 fl (ref 78.0–100.0)
MONO ABS: 0.9 10*3/uL (ref 0.1–1.0)
Monocytes Relative: 11.9 % (ref 3.0–12.0)
NEUTROS PCT: 52.7 % (ref 43.0–77.0)
Neutro Abs: 4.1 10*3/uL (ref 1.4–7.7)
Platelets: 341 10*3/uL (ref 150.0–400.0)
RBC: 4.6 Mil/uL (ref 3.87–5.11)
RDW: 13.9 % (ref 11.5–15.5)
WBC: 7.8 10*3/uL (ref 4.0–10.5)

## 2016-06-26 LAB — HEPATIC FUNCTION PANEL
ALBUMIN: 4.6 g/dL (ref 3.5–5.2)
ALK PHOS: 73 U/L (ref 39–117)
ALT: 16 U/L (ref 0–35)
AST: 17 U/L (ref 0–37)
Bilirubin, Direct: 0.1 mg/dL (ref 0.0–0.3)
Total Bilirubin: 0.6 mg/dL (ref 0.2–1.2)
Total Protein: 7.7 g/dL (ref 6.0–8.3)

## 2016-06-26 LAB — LIPID PANEL
CHOL/HDL RATIO: 3
Cholesterol: 154 mg/dL (ref 0–200)
HDL: 53.7 mg/dL (ref >39.00)
LDL CALC: 85 mg/dL (ref 0–99)
NONHDL: 100.27
Triglycerides: 78 mg/dL (ref 0.0–149.0)
VLDL: 15.6 mg/dL (ref 0.0–40.0)

## 2016-06-26 LAB — VITAMIN D 25 HYDROXY (VIT D DEFICIENCY, FRACTURES): VITD: 66.65 ng/mL (ref 30.00–100.00)

## 2016-06-26 LAB — HEMOGLOBIN A1C: Hgb A1c MFr Bld: 7.5 % — ABNORMAL HIGH (ref 4.6–6.5)

## 2016-06-26 MED ORDER — SUVOREXANT 15 MG PO TABS: 1.0000 | ORAL_TABLET | Freq: Every day | ORAL | 3 refills | Status: DC

## 2016-06-26 NOTE — Patient Instructions (Signed)
Follow up in 3-4 months to recheck diabetes We'll notify you of your lab results and make any changes if needed Keep up the good work on healthy diet and regular exercise- you look great!!! Start the Slater nightly to improve sleep Call with any questions or concerns Happy Early Rudene Anda!!!

## 2016-06-26 NOTE — Addendum Note (Signed)
Addended by: Davis Gourd on: 06/26/2016 08:56 AM   Modules accepted: Orders

## 2016-06-26 NOTE — Progress Notes (Signed)
   Subjective:    Patient ID: Amber Washington, female    DOB: Feb 05, 1956, 60 y.o.   MRN: [id number]  HPI CPE- UTD on mammo, pap (GYN), colonoscopy (due 2021).  UTD on eye exam.  Pt has lost 25 lbs since last visit!   Review of Systems Patient reports no vision/ hearing changes, adenopathy,fever, weight change,  persistant/recurrent hoarseness , swallowing issues, chest pain, palpitations, edema, persistant/recurrent cough, hemoptysis, dyspnea (rest/exertional/paroxysmal nocturnal), gastrointestinal bleeding (melena, rectal bleeding), abdominal pain, significant heartburn, bowel changes, GU symptoms (dysuria, hematuria, incontinence), Gyn symptoms (abnormal  bleeding, pain),  syncope, focal weakness, memory loss, numbness & tingling, skin/hair/nail changes, abnormal bruising or bleeding, anxiety, or depression.     Objective:   Physical Exam General Appearance:    Alert, cooperative, no distress, appears stated age  Head:    Normocephalic, without obvious abnormality, atraumatic  Eyes:    PERRL, conjunctiva/corneas clear, EOM's intact, fundi    benign, both eyes  Ears:    Normal TM's and external ear canals, both ears  Nose:   Nares normal, septum midline, mucosa normal, no drainage    or sinus tenderness  Throat:   Lips, mucosa, and tongue normal; teeth and gums normal  Neck:   Supple, symmetrical, trachea midline, no adenopathy;    Thyroid: no enlargement/tenderness/nodules  Back:     Symmetric, no curvature, ROM normal, no CVA tenderness  Lungs:     Clear to auscultation bilaterally, respirations unlabored  Chest Wall:    No tenderness or deformity   Heart:    Regular rate and rhythm, S1 and S2 normal, no murmur, rub   or gallop  Breast Exam:    Deferred to GYN  Abdomen:     Soft, non-tender, bowel sounds active all four quadrants,    no masses, no organomegaly  Genitalia:    Deferred to GYN  Rectal:    Extremities:   Extremities normal, atraumatic, no cyanosis or edema  Pulses:    2+ and symmetric all extremities  Skin:   Skin color, texture, turgor normal, no rashes or lesions  Lymph nodes:   Cervical, supraclavicular, and axillary nodes normal  Neurologic:   CNII-XII intact, normal strength, sensation and reflexes    throughout          Assessment & Plan:

## 2016-06-26 NOTE — Progress Notes (Signed)
Pre visit review using our clinic review tool, if applicable. No additional management support is needed unless otherwise documented below in the visit note. 

## 2016-06-26 NOTE — Assessment & Plan Note (Signed)
Pt's PE WNL.  UTD on pap, mammo, colonoscopy.  Update Pneumovax today.  Applauded her recent 25 lb weight loss!  Check labs.  Anticipatory guidance provided.

## 2016-06-26 NOTE — Assessment & Plan Note (Signed)
Chronic problem for pt.  No improvement w/ trazodone.  Start Belsomra.  Instructions given and coupons provided.  Will follow.

## 2016-06-26 NOTE — Assessment & Plan Note (Signed)
Chronic problem.  Pt has lost 25 lbs since last visit!  UTD on eye exam.  Foot exam done today.  On ARB for renal protection.  Check labs.  Adjust meds prn

## 2016-07-07 ENCOUNTER — Other Ambulatory Visit: Payer: Self-pay | Admitting: Family Medicine

## 2016-09-29 ENCOUNTER — Encounter: Payer: Self-pay | Admitting: Family Medicine

## 2016-09-29 ENCOUNTER — Other Ambulatory Visit: Payer: Self-pay | Admitting: General Practice

## 2016-09-29 MED ORDER — LOSARTAN POTASSIUM 50 MG PO TABS: 50.0000 mg | ORAL_TABLET | Freq: Every day | ORAL | 1 refills | Status: DC

## 2016-10-22 ENCOUNTER — Encounter: Payer: Self-pay | Admitting: Family Medicine

## 2016-10-22 ENCOUNTER — Other Ambulatory Visit: Payer: Self-pay | Admitting: Family Medicine

## 2016-10-22 DIAGNOSIS — E119 Type 2 diabetes mellitus without complications: Secondary | ICD-10-CM

## 2016-10-22 DIAGNOSIS — E7800 Pure hypercholesterolemia, unspecified: Secondary | ICD-10-CM

## 2016-10-22 DIAGNOSIS — I10 Essential (primary) hypertension: Secondary | ICD-10-CM

## 2016-10-27 ENCOUNTER — Other Ambulatory Visit (INDEPENDENT_AMBULATORY_CARE_PROVIDER_SITE_OTHER): Payer: BC Managed Care – PPO

## 2016-10-27 ENCOUNTER — Other Ambulatory Visit: Payer: Self-pay | Admitting: Family Medicine

## 2016-10-27 DIAGNOSIS — E119 Type 2 diabetes mellitus without complications: Secondary | ICD-10-CM | POA: Diagnosis not present

## 2016-10-27 DIAGNOSIS — E7800 Pure hypercholesterolemia, unspecified: Secondary | ICD-10-CM | POA: Diagnosis not present

## 2016-10-27 DIAGNOSIS — I10 Essential (primary) hypertension: Secondary | ICD-10-CM

## 2016-10-27 LAB — CBC WITH DIFFERENTIAL/PLATELET
BASOS ABS: 0.1 10*3/uL (ref 0.0–0.1)
Basophils Relative: 0.9 % (ref 0.0–3.0)
EOS ABS: 0.4 10*3/uL (ref 0.0–0.7)
Eosinophils Relative: 5.3 % — ABNORMAL HIGH (ref 0.0–5.0)
HCT: 39.9 % (ref 36.0–46.0)
HEMOGLOBIN: 13.2 g/dL (ref 12.0–15.0)
Lymphocytes Relative: 33.7 % (ref 12.0–46.0)
Lymphs Abs: 2.3 10*3/uL (ref 0.7–4.0)
MCHC: 33.2 g/dL (ref 30.0–36.0)
MCV: 89.1 fl (ref 78.0–100.0)
MONO ABS: 0.8 10*3/uL (ref 0.1–1.0)
Monocytes Relative: 11.8 % (ref 3.0–12.0)
Neutro Abs: 3.2 10*3/uL (ref 1.4–7.7)
Neutrophils Relative %: 48.3 % (ref 43.0–77.0)
Platelets: 343 10*3/uL (ref 150.0–400.0)
RBC: 4.47 Mil/uL (ref 3.87–5.11)
RDW: 13.3 % (ref 11.5–15.5)
WBC: 6.7 10*3/uL (ref 4.0–10.5)

## 2016-10-27 LAB — LIPID PANEL
CHOL/HDL RATIO: 3
Cholesterol: 164 mg/dL (ref 0–200)
HDL: 56.5 mg/dL (ref >39.00)
LDL CALC: 94 mg/dL (ref 0–99)
NonHDL: 107.73
TRIGLYCERIDES: 69 mg/dL (ref 0.0–149.0)
VLDL: 13.8 mg/dL (ref 0.0–40.0)

## 2016-10-27 LAB — BASIC METABOLIC PANEL
BUN: 16 mg/dL (ref 6–23)
CALCIUM: 9.6 mg/dL (ref 8.4–10.5)
CO2: 31 meq/L (ref 19–32)
CREATININE: 0.86 mg/dL (ref 0.40–1.20)
Chloride: 102 mEq/L (ref 96–112)
GFR: 71.95 mL/min (ref >60.00)
Glucose, Bld: 132 mg/dL — ABNORMAL HIGH (ref 70–99)
Potassium: 4 mEq/L (ref 3.5–5.1)
Sodium: 140 mEq/L (ref 135–145)

## 2016-10-27 LAB — HEPATIC FUNCTION PANEL
ALT: 17 U/L (ref 0–35)
AST: 20 U/L (ref 0–37)
Albumin: 4.3 g/dL (ref 3.5–5.2)
Alkaline Phosphatase: 64 U/L (ref 39–117)
BILIRUBIN DIRECT: 0.1 mg/dL (ref 0.0–0.3)
BILIRUBIN TOTAL: 0.6 mg/dL (ref 0.2–1.2)
Total Protein: 7.3 g/dL (ref 6.0–8.3)

## 2016-10-27 LAB — TSH: TSH: 1.54 u[IU]/mL (ref 0.35–4.50)

## 2016-10-27 LAB — HEMOGLOBIN A1C: Hgb A1c MFr Bld: 7.8 % — ABNORMAL HIGH (ref 4.6–6.5)

## 2016-10-29 ENCOUNTER — Encounter: Payer: Self-pay | Admitting: Family Medicine

## 2016-10-29 ENCOUNTER — Ambulatory Visit (INDEPENDENT_AMBULATORY_CARE_PROVIDER_SITE_OTHER): Payer: BC Managed Care – PPO | Admitting: Family Medicine

## 2016-10-29 VITALS — BP 121/80 | HR 72 | Temp 98.0°F | Resp 16 | Ht 68.0 in | Wt 145.0 lb

## 2016-10-29 DIAGNOSIS — E119 Type 2 diabetes mellitus without complications: Secondary | ICD-10-CM | POA: Diagnosis not present

## 2016-10-29 NOTE — Progress Notes (Signed)
Pre visit review using our clinic review tool, if applicable. No additional management support is needed unless otherwise documented below in the visit note. 

## 2016-10-29 NOTE — Assessment & Plan Note (Signed)
Chronic problem.  Pt's A1C continues to worsen despite intensive efforts at diet and exercise and excellent medication compliance.  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  Will refer to Endo for ongoing tx and complete evaluation.  UTD on foot exam, eye exam.  On ARB for renal protection.

## 2016-10-29 NOTE — Progress Notes (Signed)
   Subjective:    Patient ID: Amber Washington, female    DOB: [birth year], 61 y.o.   MRN: [id number]  HPI DM- chronic problem, UTD on foot exam, eye exam.  On ARB for renal protection.  A1C is now 7.8.  All other labs fabulous.  On Janumet and Jardiance.  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  Denies symptomatic lows.  No CP, SOB, HAs, visual changes, numbness/tingling of hands/feet.  No abd pain, N/V.   Review of Systems For ROS see HPI     Objective:   Physical Exam  Constitutional: She is oriented to person, place, and time. She appears well-developed and well-nourished. No distress.  HENT:  Head: Normocephalic and atraumatic.  Eyes: Conjunctivae and EOM are normal. Pupils are equal, round, and reactive to light.  Neck: Normal range of motion. Neck supple. No thyromegaly present.  Cardiovascular: Normal rate, regular rhythm, normal heart sounds and intact distal pulses.   No murmur heard. Pulmonary/Chest: Effort normal and breath sounds normal. No respiratory distress.  Abdominal: Soft. She exhibits no distension. There is no tenderness.  Musculoskeletal: She exhibits no edema.  Lymphadenopathy:    She has no cervical adenopathy.  Neurological: She is alert and oriented to person, place, and time.  Skin: Skin is warm and dry.  Psychiatric: She has a normal mood and affect. Her behavior is normal.  Vitals reviewed.         Assessment & Plan:

## 2016-10-29 NOTE — Patient Instructions (Signed)
Schedule your complete physical for October We'll call you with your Endo appt Keep up the good work!  You look great!!! Call with any questions or concerns Enjoy the wedding!!!

## 2016-11-10 ENCOUNTER — Other Ambulatory Visit: Payer: Self-pay | Admitting: Family Medicine

## 2016-11-24 ENCOUNTER — Ambulatory Visit (INDEPENDENT_AMBULATORY_CARE_PROVIDER_SITE_OTHER): Payer: BC Managed Care – PPO | Admitting: Family Medicine

## 2016-11-24 ENCOUNTER — Encounter: Payer: Self-pay | Admitting: Family Medicine

## 2016-11-24 VITALS — BP 120/81 | HR 78 | Temp 98.0°F | Resp 16 | Ht 68.0 in | Wt 143.4 lb

## 2016-11-24 DIAGNOSIS — J4521 Mild intermittent asthma with (acute) exacerbation: Secondary | ICD-10-CM

## 2016-11-24 DIAGNOSIS — J45909 Unspecified asthma, uncomplicated: Secondary | ICD-10-CM

## 2016-11-24 MED ORDER — AMOXICILLIN 875 MG PO TABS: 875.0000 mg | ORAL_TABLET | Freq: Two times a day (BID) | ORAL | 0 refills | Status: DC

## 2016-11-24 MED ORDER — ALBUTEROL SULFATE (2.5 MG/3ML) 0.083% IN NEBU
2.5000 mg | INHALATION_SOLUTION | Freq: Once | RESPIRATORY_TRACT | Status: AC
  Administered 2016-11-24: 2.5 mg via RESPIRATORY_TRACT

## 2016-11-24 MED ORDER — ALBUTEROL SULFATE HFA 108 (90 BASE) MCG/ACT IN AERS: 2.0000 | INHALATION_SPRAY | Freq: Four times a day (QID) | RESPIRATORY_TRACT | 2 refills | Status: DC | PRN

## 2016-11-24 NOTE — Progress Notes (Signed)
Pre visit review using our clinic review tool, if applicable. No additional management support is needed unless otherwise documented below in the visit note. 

## 2016-11-24 NOTE — Assessment & Plan Note (Signed)
Deteriorated.  Pt w/ acute exacerbation w/ likely associated sinusitis.  sxs improved s/p neb tx in office.  Start abx.  Start albuterol inhaler.  Reviewed supportive care and red flags that should prompt return.  Pt expressed understanding and is in agreement w/ plan.

## 2016-11-24 NOTE — Progress Notes (Signed)
   Subjective:    Patient ID: Amber Washington, female    DOB: 04-22-1956, 61 y.o.   MRN: [id number]  HPI URI- sxs started ~10 days ago w/ sneezing and a runny nose.  Then developed sinus pain.  Started Mucinex DM w/o improvement.  No fever, no cough.  + chest tightness.  Some SOB w/ exertion.  No ear pain.  No N/V.  No tooth pain.  No sick contacts.   Review of Systems For ROS see HPI     Objective:   Physical Exam  Constitutional: She appears well-developed and well-nourished. No distress.  HENT:  Head: Normocephalic and atraumatic.  Right Ear: Tympanic membrane normal.  Left Ear: Tympanic membrane normal.  Nose: Mucosal edema and rhinorrhea present. Right sinus exhibits no maxillary sinus tenderness and no frontal sinus tenderness. Left sinus exhibits no maxillary sinus tenderness and no frontal sinus tenderness.  Mouth/Throat: Mucous membranes are normal. Posterior oropharyngeal erythema (w/ PND) present.  Eyes: Conjunctivae and EOM are normal. Pupils are equal, round, and reactive to light.  Neck: Normal range of motion. Neck supple.  Cardiovascular: Normal rate, regular rhythm and normal heart sounds.   Pulmonary/Chest: Effort normal. No respiratory distress. She has no wheezes. She has no rales.  Decreased BS diffusely w/o crackles or wheezes- air movement improved s/p neb tx in office.  Lymphadenopathy:    She has no cervical adenopathy.  Vitals reviewed.         Assessment & Plan:

## 2016-11-24 NOTE — Patient Instructions (Signed)
Follow up as needed/scheduled Start the Amoxicillin twice daily- take w/ food Drink plenty of fluids Use the Albuterol inhaler as needed- 2 puffs every 4 hrs as needed for chest tightness/congestion REST! Mucinex DM for cough/congestion Call with any questions or concerns Hang in there!!!

## 2016-12-02 ENCOUNTER — Encounter: Payer: Self-pay | Admitting: Physician Assistant

## 2016-12-02 ENCOUNTER — Encounter: Payer: Self-pay | Admitting: Family Medicine

## 2016-12-02 ENCOUNTER — Ambulatory Visit (INDEPENDENT_AMBULATORY_CARE_PROVIDER_SITE_OTHER): Payer: BC Managed Care – PPO | Admitting: Physician Assistant

## 2016-12-02 VITALS — BP 120/78 | HR 74 | Temp 98.2°F | Resp 14 | Ht 68.0 in | Wt 145.0 lb

## 2016-12-02 DIAGNOSIS — R0789 Other chest pain: Secondary | ICD-10-CM

## 2016-12-02 MED ORDER — BUDESONIDE 180 MCG/ACT IN AEPB: 1.0000 | INHALATION_SPRAY | Freq: Two times a day (BID) | RESPIRATORY_TRACT | 0 refills | Status: DC

## 2016-12-02 MED ORDER — ALBUTEROL SULFATE (2.5 MG/3ML) 0.083% IN NEBU
2.5000 mg | INHALATION_SOLUTION | Freq: Once | RESPIRATORY_TRACT | Status: AC
  Administered 2016-12-02: 2.5 mg via RESPIRATORY_TRACT

## 2016-12-02 NOTE — Patient Instructions (Signed)
Please finish the entire course of antibiotic. Keep hydrated.  Continue albuterol and singulair.  Start Pulmicort as directed.  If symptoms are not resolving over the next 3-5 days, we will need to consider Pulmonology assessment.

## 2016-12-02 NOTE — Progress Notes (Signed)
Pre visit review using our clinic review tool, if applicable. No additional management support is needed unless otherwise documented below in the visit note. 

## 2016-12-02 NOTE — Progress Notes (Signed)
Patient presents to clinic today c/o chest tightness over the past couple of days despite use of albuterol inhaler. Patient is currently being treated for URI with ABX and rescue inhaler. Has noted symptoms overall resolving but has noted some increased chest tightness, especially at night. Denies fever, chills, chest pain or SOB. Has long-standing history of asthma, well-controlled previously with Singulair.   Past Medical History:  Diagnosis Date  . Allergic rhinitis   . Anxiety state, unspecified   . Asthma   . Benign neoplasm of pancreas, except islets of Langerhans   . Borderline hypertension   . Hypercholesteremia   . Malignant neoplasm of breast (female), unspecified site   . Osteoporosis, unspecified   . Stenosis of nasolacrimal duct, acquired   . Type II or unspecified type diabetes mellitus without mention of complication, not stated as uncontrolled     Current Outpatient Prescriptions on File Prior to Visit  Medication Sig Dispense Refill  . albuterol (PROVENTIL HFA;VENTOLIN HFA) 108 (90 Base) MCG/ACT inhaler Inhale 2 puffs into the lungs every 6 (six) hours as needed for wheezing or shortness of breath. 1 Inhaler 2  . ALPRAZolam (XANAX) 0.5 MG tablet TAKE ONE TABLET BY MOUTH THREE TIMES DAILY AS NEEDED FOR SLEEP OR FOR ANXIETY 90 tablet 3  . amoxicillin (AMOXIL) 875 MG tablet Take 1 tablet (875 mg total) by mouth 2 (two) times daily. 20 tablet 0  . aspirin 81 MG tablet Take 81 mg by mouth daily.      Marland Kitchen atorvastatin (LIPITOR) 10 MG tablet Take 1 tablet (10 mg total) by mouth daily. 90 tablet 1  . Cholecalciferol (VITAMIN D) 2000 UNITS CAPS Take 1 capsule by mouth daily.      Marland Kitchen JANUMET XR [phone] MG TB24 TAKE ONE TABLET BY MOUTH ONCE DAILY 30 tablet 6  . JARDIANCE 10 MG TABS tablet TAKE ONE TABLET BY MOUTH ONCE DAILY 90 tablet 1  . losartan (COZAAR) 50 MG tablet Take 1 tablet (50 mg total) by mouth daily. 90 tablet 1  . montelukast (SINGULAIR) 10 MG tablet TAKE 1 TABLET AT  BEDTIME 90 tablet 1   No current facility-administered medications on file prior to visit.     Allergies  Allergen Reactions  . Influenza Vaccines Other (See Comments)    Unknown, allergic to egg whites    No family history on file.  Social History   Social History  . Marital status: Married    Spouse name: N/A  . Number of children: 2  . Years of education: N/A   Social History Main Topics  . Smoking status: Never Smoker  . Smokeless tobacco: Never Used  . Alcohol use Yes     Comment: social use  . Drug use: Unknown  . Sexual activity: Not Asked   Other Topics Concern  . None   Social History Narrative  . None   Review of Systems - See HPI.  All other ROS are negative.  BP 120/78   Pulse 74   Temp 98.2 F (36.8 C) (Oral)   Resp 14   Ht 5\' 8"  (1.727 m)   Wt 145 lb (65.8 kg)   SpO2 98%   BMI 22.05 kg/m   Physical Exam  Constitutional: She is oriented to person, place, and time and well-developed, well-nourished, and in no distress.  HENT:  Head: Normocephalic and atraumatic.  Right Ear: External ear normal.  Left Ear: External ear normal.  Nose: Nose normal.  Mouth/Throat: Oropharynx is clear and moist.  No oropharyngeal exudate.  TM within normal limits bilaterally.  Eyes: Conjunctivae are normal.  Neck: Neck supple.  Cardiovascular: Normal rate, regular rhythm, normal heart sounds and intact distal pulses.   Pulmonary/Chest: Effort normal. No respiratory distress. She has wheezes. She has no rales. She exhibits no tenderness.  Lymphadenopathy:    She has no cervical adenopathy.  Neurological: She is alert and oriented to person, place, and time.  Skin: Skin is warm and dry. No rash noted.  Psychiatric: Affect normal.  Vitals reviewed.  Recent Results (from the past 2160 hour(s))  CBC with Differential/Platelet     Status: Abnormal   Collection Time: 10/27/16  8:19 AM  Result Value Ref Range   WBC 6.7 4.0 - 10.5 K/uL   RBC 4.47 3.87 - 5.11  Mil/uL   Hemoglobin 13.2 12.0 - 15.0 g/dL   HCT 39.9 36.0 - 46.0 %   MCV 89.1 78.0 - 100.0 fl   MCHC 33.2 30.0 - 36.0 g/dL   RDW 13.3 11.5 - 15.5 %   Platelets 343.0 150.0 - 400.0 K/uL   Neutrophils Relative % 48.3 43.0 - 77.0 %   Lymphocytes Relative 33.7 12.0 - 46.0 %   Monocytes Relative 11.8 3.0 - 12.0 %   Eosinophils Relative 5.3 (H) 0.0 - 5.0 %   Basophils Relative 0.9 0.0 - 3.0 %   Neutro Abs 3.2 1.4 - 7.7 K/uL   Lymphs Abs 2.3 0.7 - 4.0 K/uL   Monocytes Absolute 0.8 0.1 - 1.0 K/uL   Eosinophils Absolute 0.4 0.0 - 0.7 K/uL   Basophils Absolute 0.1 0.0 - 0.1 K/uL  Hepatic function panel     Status: None   Collection Time: 10/27/16  8:19 AM  Result Value Ref Range   Total Bilirubin 0.6 0.2 - 1.2 mg/dL   Bilirubin, Direct 0.1 0.0 - 0.3 mg/dL   Alkaline Phosphatase 64 39 - 117 U/L   AST 20 0 - 37 U/L   ALT 17 0 - 35 U/L   Total Protein 7.3 6.0 - 8.3 g/dL   Albumin 4.3 3.5 - 5.2 g/dL  Lipid panel     Status: None   Collection Time: 10/27/16  8:19 AM  Result Value Ref Range   Cholesterol 164 0 - 200 mg/dL    Comment: ATP III Classification       Desirable:  < 200 mg/dL               Borderline High:  200 - 239 mg/dL          High:  > = 240 mg/dL   Triglycerides 69.0 0.0 - 149.0 mg/dL    Comment: Normal:  <150 mg/dLBorderline High:  150 - 199 mg/dL   HDL 56.50 >39.00 mg/dL   VLDL 13.8 0.0 - 40.0 mg/dL   LDL Cholesterol 94 0 - 99 mg/dL   Total CHOL/HDL Ratio 3     Comment:                Men          Women1/2 Average Risk     3.4          3.3Average Risk          5.0          4.42X Average Risk          9.6          7.13X Average Risk          15.0  11.0                       NonHDL 107.73     Comment: NOTE:  Non-HDL goal should be 30 mg/dL higher than patient's LDL goal (i.e. LDL goal of < 70 mg/dL, would have non-HDL goal of < 100 mg/dL)  Hemoglobin A1c     Status: Abnormal   Collection Time: 10/27/16  8:19 AM  Result Value Ref Range   Hgb A1c MFr Bld 7.8 (H) 4.6  - 6.5 %    Comment: Glycemic Control Guidelines for People with Diabetes:Non Diabetic:  <6%Goal of Therapy: <7%Additional Action Suggested:  >8%   TSH     Status: None   Collection Time: 10/27/16  8:19 AM  Result Value Ref Range   TSH 1.54 0.35 - 4.50 uIU/mL  Basic metabolic panel     Status: Abnormal   Collection Time: 10/27/16  8:19 AM  Result Value Ref Range   Sodium 140 135 - 145 mEq/L   Potassium 4.0 3.5 - 5.1 mEq/L   Chloride 102 96 - 112 mEq/L   CO2 31 19 - 32 mEq/L   Glucose, Bld 132 (H) 70 - 99 mg/dL   BUN 16 6 - 23 mg/dL   Creatinine, Ser 0.86 0.40 - 1.20 mg/dL   Calcium 9.6 8.4 - 10.5 mg/dL   GFR 71.95 >60.00 mL/min    Assessment/Plan: 1. Chest tightness Finish course of antibiotics. Albuterol nebulizer given with improvement in symptoms. Vitals stable. Supportive measures and OTC medications reviewed. Rx Pulmicort to help short-term with breathing. Return precautions discussed. - albuterol (PROVENTIL) (2.5 MG/3ML) 0.083% nebulizer solution 2.5 mg; Take 3 mLs (2.5 mg total) by nebulization once. - budesonide (PULMICORT) 180 MCG/ACT inhaler; Inhale 1 puff into the lungs 2 (two) times daily.  Dispense: 1 Inhaler; Refill: 0   Leeanne Rio, [place]

## 2016-12-04 ENCOUNTER — Encounter: Payer: Self-pay | Admitting: Internal Medicine

## 2016-12-04 ENCOUNTER — Ambulatory Visit (INDEPENDENT_AMBULATORY_CARE_PROVIDER_SITE_OTHER): Payer: BC Managed Care – PPO | Admitting: Internal Medicine

## 2016-12-04 ENCOUNTER — Other Ambulatory Visit: Payer: BC Managed Care – PPO

## 2016-12-04 VITALS — BP 112/70 | HR 68 | Resp 18 | Wt 142.6 lb

## 2016-12-04 DIAGNOSIS — E139 Other specified diabetes mellitus without complications: Secondary | ICD-10-CM

## 2016-12-04 DIAGNOSIS — E891 Postprocedural hypoinsulinemia: Secondary | ICD-10-CM | POA: Diagnosis not present

## 2016-12-04 DIAGNOSIS — Z90410 Acquired total absence of pancreas: Principal | ICD-10-CM

## 2016-12-04 LAB — GLUCOSE, POCT (MANUAL RESULT ENTRY): POC Glucose: 160 mg/dl — AB (ref 70–99)

## 2016-12-04 LAB — GLUCOSE, FASTING: Glucose, Fasting: 160 mg/dL — ABNORMAL HIGH (ref 65–99)

## 2016-12-04 MED ORDER — SITAGLIPTIN PHOSPHATE 100 MG PO TABS: 100.0000 mg | ORAL_TABLET | Freq: Every day | ORAL | 5 refills | Status: DC

## 2016-12-04 MED ORDER — GLUCOSE BLOOD VI STRP: ORAL_STRIP | 12 refills | Status: DC

## 2016-12-04 MED ORDER — LANCETS 28G MISC: 1.0000 | Freq: Every day | 11 refills | Status: DC

## 2016-12-04 MED ORDER — EMPAGLIFLOZIN 25 MG PO TABS: 25.0000 mg | ORAL_TABLET | Freq: Every day | ORAL | 5 refills | Status: DC

## 2016-12-04 MED ORDER — BAYER MICROLET LANCETS MISC: 12 refills | Status: DC

## 2016-12-04 MED ORDER — METFORMIN HCL ER 500 MG PO TB24: 1000.0000 mg | ORAL_TABLET | Freq: Every day | ORAL | 3 refills | Status: DC

## 2016-12-04 NOTE — Addendum Note (Signed)
Addended by: Philemon Kingdom on: 12/04/2016 10:55 AM   Modules accepted: Orders

## 2016-12-04 NOTE — Progress Notes (Addendum)
Patient ID: Amber Washington, female   DOB: November 01, 1955, 61 y.o.   MRN: [id number]   HPI: Amber Washington is a 61 y.o.-year-old female, referred by her PCP, Dr. 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   Last hemoglobin A1c was: Lab Results  Component Value Date   HGBA1C 7.8 (H) 10/27/2016   HGBA1C 7.5 (H) 06/26/2016   HGBA1C 7.5 (H) 01/08/2016   Pt is on a regimen of: - Janumet XR [phone] mg in am - Jardiance 10 mg daily at night She was on Glimepiride.  Pt does not check her sugars. - am: 132, 160 - 2h after b'fast: n/c - before lunch: n/c - 2h after lunch: n/c - before dinner: n/c - 2h after dinner: n/c - bedtime: n/c - nighttime: n/c  Glucometer: none  Pt's meals are: - Breakfast: oatmeal + yoghurt + crackers - Lunch: salads + meat/cheese/crackers + fruit - Dinner: chicken + veggies or salad - Snacks: nuts , crackers, carrots - 1-x a day Drinks water only.  She exercises 5-6x a week: walking, lifting weights.  She intentional lost ~30 lbs in last 10 months.  - no CKD, last BUN/creatinine:  Lab Results  Component Value Date   BUN 16 10/27/2016   BUN 25 (H) 06/26/2016   CREATININE 0.86 10/27/2016   CREATININE 0.84 06/26/2016  On Losartan. - last set of lipids: Lab Results  Component Value Date   CHOL 164 10/27/2016   HDL 56.50 10/27/2016   LDLCALC 94 10/27/2016   TRIG 69.0 10/27/2016   CHOLHDL 3 10/27/2016  On Lipitor. - last eye exam was in 05/27/2016. No DR.  - no numbness and tingling in her feet.  Pt has FH of DM in nephew.  She also has HTN and HL.  ROS - all negative: Constitutional: no weight gain/loss, no fatigue, no subjective hyperthermia/hypothermia Eyes: no blurry vision, no xerophthalmia ENT: no sore throat, no nodules palpated in throat, no dysphagia/odynophagia, no hoarseness Cardiovascular: no CP/SOB/palpitations/leg  swelling Respiratory: no cough/SOB Gastrointestinal: no N/V/D/C Musculoskeletal: no muscle/joint aches Skin: no rashes Neurological: no tremors/numbness/tingling/dizziness Psychiatric: no depression/anxiety  Past Medical History:  Diagnosis Date  . Allergic rhinitis   . Anxiety state, unspecified   . Asthma   . Benign neoplasm of pancreas, except islets of Langerhans   . Borderline hypertension   . Hypercholesteremia   . Malignant neoplasm of breast (female), unspecified site   . Osteoporosis, unspecified   . Stenosis of nasolacrimal duct, acquired   . Type II or unspecified type diabetes mellitus without mention of complication, not stated as uncontrolled    Past Surgical History:  Procedure Laterality Date  . 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 right breast cancer sugery  1995  . surgery on neck for removal of birthmark     Social History   Social History  . Marital status: Married    Spouse name: N/A  . Number of children: 2   Occupational History  . Bookkeeper   Social History Main Topics  . Smoking status: Never Smoker  . Smokeless tobacco: Never Used  . Alcohol use No  . Drug use: No   Current Outpatient Prescriptions on File Prior to Visit  Medication Sig Dispense Refill  . albuterol (PROVENTIL HFA;VENTOLIN HFA) 108 (90 Base) MCG/ACT inhaler Inhale 2 puffs into the lungs every 6 (six) hours as needed for wheezing or  shortness of breath. 1 Inhaler 2  . ALPRAZolam (XANAX) 0.5 MG tablet TAKE ONE TABLET BY MOUTH THREE TIMES DAILY AS NEEDED FOR SLEEP OR FOR ANXIETY 90 tablet 3  . amoxicillin (AMOXIL) 875 MG tablet Take 1 tablet (875 mg total) by mouth 2 (two) times daily. 20 tablet 0  . aspirin 81 MG tablet Take 81 mg by mouth daily.      Marland Kitchen atorvastatin (LIPITOR) 10 MG tablet Take 1 tablet (10 mg total) by mouth daily. 90 tablet 1  . budesonide (PULMICORT) 180 MCG/ACT inhaler Inhale 1 puff into the lungs 2 (two) times  daily. 1 Inhaler 0  . Cholecalciferol (VITAMIN D) 2000 UNITS CAPS Take 1 capsule by mouth daily.      Marland Kitchen losartan (COZAAR) 50 MG tablet Take 1 tablet (50 mg total) by mouth daily. 90 tablet 1  . montelukast (SINGULAIR) 10 MG tablet TAKE 1 TABLET AT BEDTIME 90 tablet 1   No current facility-administered medications on file prior to visit.    Allergies  Allergen Reactions  . Influenza Vaccines Other (See Comments)    Unknown, allergic to egg whites   FH: - mother - leukemia, father - liver cancer - mother - HTN  PE: BP 112/70 (BP Location: Left Arm, Patient Position: Sitting, Cuff Size: Normal)   Pulse 68   Resp 18   Wt 142 lb 9.6 oz (64.7 kg)   BMI 21.68 kg/m   Wt Readings from Last 3 Encounters:  12/04/16 142 lb 9.6 oz (64.7 kg)  12/02/16 145 lb (65.8 kg)  11/24/16 143 lb 6 oz (65 kg)   Constitutional: normal weight, in NAD Eyes: PERRLA, EOMI, no exophthalmos ENT: moist mucous membranes, no thyromegaly, no cervical lymphadenopathy Cardiovascular: RRR, No MRG Respiratory: CTA B Gastrointestinal: abdomen soft, NT, ND, BS+ Musculoskeletal: no deformities, strength intact in all 4 Skin: moist, warm, no rashes Neurological: no tremor with outstretched hands, DTR normal in all 4  ASSESSMENT: 1. DM2, non-insulin-dependent, uncontrolled, without long term complications, but with hyperglycemia  PLAN:  1. Patient with long-standing, uncontrolled diabetes, on oral antidiabetic regimen, with increasing HbA1c despite diet, exercise, and weight loss. She has a history of distal pancreatic resection (?%) For a benign cyst and she developed diabetes soon after the surgery. She had a recent increasing HbA1c while on oral medicines and she is wondering whether she needs insulin. - We discussed about further improvement in the diet to reduce her insulin resistance even more, but it is difficult to make changes in her regimen for now since she is not checking sugars at home at all. I strongly  advised her to start checking 1-2 times a day, rotating check times. We gave her a Conserv[employer], museum/gallery and sent prescriptions for strips and lancets to her pharmacy. - For now, we decided to optimize her current regimen by splitting Janumet and moving Jardiance in the morning. We'll also increase her Jardiance dose. - To check her insulin production, today we will check a fasting glucose and a C-peptide (CBG in the office 160). We did discuss that if the C-peptide is low, she will need basal insulin. - I suggested to:  Patient Instructions  Please split JanuMet XR into: - Januvia 100 mg before b'fast - Metformin XR 1000 mg >> move this with dinner  Increase: - Jardiance to 25 mg before b'fast  Start checking sugars 1-2x a day, rotating check times.  Please stop at the lab.  Please return in 1.5 months with your sugar  log.   - given sugar log and advised how to fill it and to bring it at next appt  - given foot care handout and explained the principles  - given instructions for hypoglycemia management "15-15 rule"  - advised for yearly eye exams >> she is UTD  - Return to clinic in 1.5 mo with sugar log   Component     Latest Ref Rng & Units 12/04/2016  C-Peptide     0.80 - 3.85 ng/mL 0.94  Glucose, Fasting     65 - 99 mg/dL 160 (H)  C-peptide is not very high, but is still in the normal range. We'll continue with the above regimen for now.  Philemon Kingdom, MD PhD [facility]

## 2016-12-04 NOTE — Patient Instructions (Signed)
Please split JanuMet XR into: - Januvia 100 mg before b'fast - Metformin XR 1000 mg >> move this with dinner  Increase: - Jardiance to 25 mg before b'fast  Start checking sugars 1-2x a day, rotating check times.  Please stop at the lab.  Please return in 1.5 months with your sugar log.   PATIENT INSTRUCTIONS FOR TYPE 2 DIABETES:  **Please join MyChart!** - see attached instructions about how to join if you have not done so already.  DIET AND EXERCISE Diet and exercise is an important part of diabetic treatment.  We recommended aerobic exercise in the form of brisk walking (working between 40-60% of maximal aerobic capacity, similar to brisk walking) for 150 minutes per week (such as 30 minutes five days per week) along with 3 times per week performing 'resistance' training (using various gauge rubber tubes with handles) 5-10 exercises involving the major muscle groups (upper body, lower body and core) performing 10-15 repetitions (or near fatigue) each exercise. Start at half the above goal but build slowly to reach the above goals. If limited by weight, joint pain, or disability, we recommend daily walking in a swimming pool with water up to waist to reduce pressure from joints while allow for adequate exercise.    BLOOD GLUCOSES Monitoring your blood glucoses is important for continued management of your diabetes. Please check your blood glucoses 2-4 times a day: fasting, before meals and at bedtime (you can rotate these measurements - e.g. one day check before the 3 meals, the next day check before 2 of the meals and before bedtime, etc.).   HYPOGLYCEMIA (low blood sugar) Hypoglycemia is usually a reaction to not eating, exercising, or taking too much insulin/ other diabetes drugs.  Symptoms include tremors, sweating, hunger, confusion, headache, etc. Treat IMMEDIATELY with 15 grams of Carbs: . 4 glucose tablets .  cup regular juice/soda . 2 tablespoons raisins . 4 teaspoons  sugar . 1 tablespoon honey Recheck blood glucose in 15 mins and repeat above if still symptomatic/blood glucose <100.  RECOMMENDATIONS TO REDUCE YOUR RISK OF DIABETIC COMPLICATIONS: * Take your prescribed MEDICATION(S) * Follow a DIABETIC diet: Complex carbs, fiber rich foods, (monounsaturated and polyunsaturated) fats * AVOID saturated/trans fats, high fat foods, >2,300 mg salt per day. * EXERCISE at least 5 times a week for 30 minutes or preferably daily.  * DO NOT SMOKE OR DRINK more than 1 drink a day. * Check your FEET every day. Do not wear tightfitting shoes. Contact us if you develop an ulcer * See your EYE doctor once a year or more if needed * Get a FLU shot once a year * Get a PNEUMONIA vaccine once before and once after age 70 years  GOALS:  * Your Hemoglobin A1c of <7%  * fasting sugars need to be <130 * after meals sugars need to be <180 (2h after you start eating) * Your Systolic BP should be 295 or lower  * Your Diastolic BP should be 80 or lower  * Your HDL (Good Cholesterol) should be 40 or higher  * Your LDL (Bad Cholesterol) should be 100 or lower. * Your Triglycerides should be 150 or lower  * Your Urine microalbumin (kidney function) should be <30 * Your Body Mass Index should be 25 or lower    Please consider the following ways to cut down carbs and fat and increase fiber and micronutrients in your diet: - substitute whole grain for white bread or pasta - substitute brown rice  for white rice - substitute 90-calorie flat bread pieces for slices of bread when possible - substitute sweet potatoes or yams for white potatoes - substitute humus for margarine - substitute tofu for cheese when possible - substitute almond or rice milk for regular milk (would not drink soy milk daily due to concern for soy estrogen influence on breast cancer risk) - substitute dark chocolate for other sweets when possible - substitute water - can add lemon or orange slices for taste  - for diet sodas (artificial sweeteners will trick your body that you can eat sweets without getting calories and will lead you to overeating and weight gain in the long run) - do not skip breakfast or other meals (this will slow down the metabolism and will result in more weight gain over time)  - can try smoothies made from fruit and almond/rice milk in am instead of regular breakfast - can also try old-fashioned (not instant) oatmeal made with almond/rice milk in am - order the dressing on the side when eating salad at a restaurant (pour less than half of the dressing on the salad) - eat as little meat as possible - can try juicing, but should not forget that juicing will get rid of the fiber, so would alternate with eating raw veg./fruits or drinking smoothies - use as little oil as possible, even when using olive oil - can dress a salad with a mix of balsamic vinegar and lemon juice, for e.g. - use agave nectar, stevia sugar, or regular sugar rather than artificial sweateners - steam or broil/roast veggies  - snack on veggies/fruit/nuts (unsalted, preferably) when possible, rather than processed foods - reduce or eliminate aspartame in diet (it is in diet sodas, chewing gum, etc) Read the labels!  Try to read Dr. Janene Harvey book: "Program for Reversing Diabetes" for other ideas for healthy eating.

## 2016-12-05 LAB — C-PEPTIDE: C-Peptide: 0.94 ng/mL (ref 0.80–3.85)

## 2016-12-09 ENCOUNTER — Other Ambulatory Visit: Payer: Self-pay | Admitting: Family Medicine

## 2016-12-11 ENCOUNTER — Other Ambulatory Visit: Payer: Self-pay | Admitting: General Practice

## 2016-12-11 MED ORDER — ALPRAZOLAM 0.5 MG PO TABS
ORAL_TABLET | ORAL | 0 refills | Status: DC
Start: 2016-12-11 — End: 2017-04-01

## 2016-12-11 NOTE — Telephone Encounter (Signed)
Last OV 12/02/16 Alprazolam last filled 05/06/16 #90 with 3  This request is for mail order pharmacy.

## 2016-12-11 NOTE — Telephone Encounter (Signed)
Medication filled to pharmacy as requested.   

## 2017-02-05 ENCOUNTER — Encounter: Payer: Self-pay | Admitting: Family Medicine

## 2017-02-05 MED ORDER — FLUCONAZOLE 150 MG PO TABS: 150.0000 mg | ORAL_TABLET | Freq: Once | ORAL | 0 refills | Status: AC

## 2017-02-19 ENCOUNTER — Ambulatory Visit (INDEPENDENT_AMBULATORY_CARE_PROVIDER_SITE_OTHER): Payer: BC Managed Care – PPO | Admitting: Internal Medicine

## 2017-02-19 ENCOUNTER — Encounter: Payer: Self-pay | Admitting: Internal Medicine

## 2017-02-19 VITALS — BP 114/80 | HR 70 | Wt 134.0 lb

## 2017-02-19 DIAGNOSIS — E119 Type 2 diabetes mellitus without complications: Secondary | ICD-10-CM

## 2017-02-19 LAB — POCT GLYCOSYLATED HEMOGLOBIN (HGB A1C): HEMOGLOBIN A1C: 7.5

## 2017-02-19 MED ORDER — INSULIN GLARGINE 100 UNIT/ML SOLOSTAR PEN: 10.0000 [IU] | PEN_INJECTOR | Freq: Every day | SUBCUTANEOUS | 11 refills | Status: DC

## 2017-02-19 MED ORDER — INSULIN PEN NEEDLE 32G X 4 MM MISC: 3 refills | Status: DC

## 2017-02-19 MED ORDER — FLUCONAZOLE 150 MG PO TABS: 150.0000 mg | ORAL_TABLET | Freq: Once | ORAL | 1 refills | Status: AC

## 2017-02-19 NOTE — Progress Notes (Signed)
Patient ID: Amber Washington, female   DOB: 1955-09-21, 61 y.o.   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  Last visit ~3 mo ago.  She lost 8 more lbs since last visit.  Last hemoglobin A1c was: Lab Results  Component Value Date   HGBA1C 7.8 (H) 10/27/2016   HGBA1C 7.5 (H) 06/26/2016   HGBA1C 7.5 (H) 01/08/2016   Pt was on a regimen of: - Janumet XR [phone] mg in am - Jardiance 10 mg daily at night She was on Glimepiride.  At last OV, we changed to: - Januvia 100 mg before b'fast - Metformin XR 1000 mg with dinner - Jardiance 25 mg before b'fast >> yeast inf's   Pt checks sugars 1x a day: - am: 132, 160 >> 130-169 - 2h after b'fast: n/c - before lunch: n/c >> 119-156 - 2h after lunch: n/c >> 216 - before dinner: n/c  >> 105-172, 204 - 2h after dinner: n/c >> 179-247 - bedtime: n/c >> 152-244 - nighttime: n/c  Glucometer: Bayer  Pt's meals are: - Breakfast: oatmeal + yoghurt + crackers - Lunch: salads + meat/cheese/crackers + fruit - Dinner: chicken + veggies or salad - Snacks: nuts , crackers, carrots - 1-x a day Drinks water only.  She exercises 5-6x a week: walking, lifting weights.  - No CKD, last BUN/creatinine:  Lab Results  Component Value Date   BUN 16 10/27/2016   BUN 25 (H) 06/26/2016   CREATININE 0.86 10/27/2016   CREATININE 0.84 06/26/2016  On Losartan. - last set of lipids: Lab Results  Component Value Date   CHOL 164 10/27/2016   HDL 56.50 10/27/2016   LDLCALC 94 10/27/2016   TRIG 69.0 10/27/2016   CHOLHDL 3 10/27/2016  On Lipitor. - last eye exam was in 05/2016 >> No DR. - denies  numbness and tingling in her feet.  She also has HTN and HL.  ROS: Constitutional: + weight loss, no fatigue, no subjective hyperthermia, no subjective hypothermia Eyes: no blurry  vision, no xerophthalmia ENT: no sore throat, no nodules palpated in throat, no dysphagia, no odynophagia, no hoarseness Cardiovascular: no CP/no SOB/no palpitations/no leg swelling Respiratory: no cough/no SOB/no wheezing Gastrointestinal: no N/no V/no D/no C/no acid reflux Musculoskeletal: no muscle aches/no joint aches Skin: no rashes, no hair loss Neurological: no tremors/no numbness/no tingling/no dizziness  I reviewed pt's medications, allergies, PMH, social hx, family hx, and changes were documented in the history of present illness. Otherwise, unchanged from my initial visit note.   Past Medical History:  Diagnosis Date  . Allergic rhinitis   . Anxiety state, unspecified   . Asthma   . Benign neoplasm of pancreas, except islets of Langerhans   . Borderline hypertension   . Hypercholesteremia   . Malignant neoplasm of breast (female), unspecified site   . Osteoporosis, unspecified   . Stenosis of nasolacrimal duct, acquired   . Type II or unspecified type diabetes mellitus without mention of complication, not stated as uncontrolled    Past Surgical History:  Procedure Laterality Date  . 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 right breast cancer sugery  1995  . surgery on neck for removal of birthmark     Social History   Social History  . Marital status: Married    Spouse name: N/A  .  Number of children: 2   Occupational History  . Bookkeeper   Social History Main Topics  . Smoking status: Never Smoker  . Smokeless tobacco: Never Used  . Alcohol use No  . Drug use: No   Current Outpatient Prescriptions on File Prior to Visit  Medication Sig Dispense Refill  . albuterol (PROVENTIL HFA;VENTOLIN HFA) 108 (90 Base) MCG/ACT inhaler Inhale 2 puffs into the lungs every 6 (six) hours as needed for wheezing or shortness of breath. 1 Inhaler 2  . ALPRAZolam (XANAX) 0.5 MG tablet TAKE ONE TABLET BY MOUTH THREE TIMES DAILY AS  NEEDED FOR SLEEP OR FOR ANXIETY 90 tablet 0  . aspirin 81 MG tablet Take 81 mg by mouth daily.      Marland Kitchen atorvastatin (LIPITOR) 10 MG tablet TAKE 1 TABLET DAILY 90 tablet 1  . BAYER MICROLET LANCETS lancets Use 1-2x a DAY  - for Bayer Contour 100 each 12  . Cholecalciferol (VITAMIN D) 2000 UNITS CAPS Take 1 capsule by mouth daily.      . empagliflozin (JARDIANCE) 25 MG TABS tablet Take 25 mg by mouth daily. 30 tablet 5  . glucose blood test strip Use 1-2x a day - Bayer Contour 100 each 12  . losartan (COZAAR) 50 MG tablet Take 1 tablet (50 mg total) by mouth daily. 90 tablet 1  . metFORMIN (GLUCOPHAGE-XR) 500 MG 24 hr tablet Take 2 tablets (1,000 mg total) by mouth daily with supper. 180 tablet 3  . montelukast (SINGULAIR) 10 MG tablet TAKE 1 TABLET AT BEDTIME 90 tablet 1  . sitaGLIPtin (JANUVIA) 100 MG tablet Take 1 tablet (100 mg total) by mouth daily. 30 tablet 5  . budesonide (PULMICORT) 180 MCG/ACT inhaler Inhale 1 puff into the lungs 2 (two) times daily. (Patient not taking: Reported on 02/19/2017) 1 Inhaler 0   No current facility-administered medications on file prior to visit.    Allergies  Allergen Reactions  . Influenza Vaccines Other (See Comments)    Unknown, allergic to egg whites   FH: - mother - leukemia, father - liver cancer - mother - HTN - DM in nephew.  PE: BP 114/80 (BP Location: Left Arm, Patient Position: Sitting)   Pulse 70   Wt 134 lb (60.8 kg)   SpO2 97%   BMI 20.37 kg/m  Wt Readings from Last 3 Encounters:  02/19/17 134 lb (60.8 kg)  12/04/16 142 lb 9.6 oz (64.7 kg)  12/02/16 145 lb (65.8 kg)   Constitutional: normal weight, in NAD Eyes: PERRLA, EOMI, no exophthalmos ENT: moist mucous membranes, no thyromegaly, no cervical lymphadenopathy Cardiovascular: RRR, No MRG Respiratory: CTA B Gastrointestinal: abdomen soft, NT, ND, BS+ Musculoskeletal: no deformities, strength intact in all 4 Skin: moist, warm, no rashes Neurological: no tremor with  outstretched hands, DTR normal in all 4   ASSESSMENT: 1. DM2, non-insulin-dependent, uncontrolled, without long term complications, but with hyperglycemia  Component     Latest Ref Rng & Units 12/04/2016  C-Peptide     0.80 - 3.85 ng/mL 0.94  Glucose, Fasting     65 - 99 mg/dL 160 (H)  C-peptide is not very high, but is still in the normal range.   PLAN:  1. Patient with long-standing, uncontrolled diabetes, on oral antidiabetic regimen. At last visit, we increased Jardiance >> yeast inf's and sugars did not decrease too much >> will stop. She has a history of distal pancreatic resection (?%) For a benign cyst and she developed diabetes soon after the surgery. At  last visit, we checked a C pp >> low-normal.  - as sugars still high especially in am and after meals despite a vegan diet >> will add basal insulin and titrate the dose up - demonstrated pen use - discussed proper injection techniques:  Preference for the abdominal sq tissue  Rotation of sites  Change needle for each injection  Keep needle in for 10 sec after last unit of insulin in - I suggested to:  Patient Instructions  Please continue: - Januvia 100 mg before b'fast - Metformin XR 1000 mg with dinner  Stop Jardiance.  Start Lantus 8 units at bedtime. Increase to 10 units in 3 days if sugars in am are not <140.  When injecting insulin:  Inject in the abdomen  Rotate the injection sites around the belly button  Change needle for each injection  Keep needle in for 10 sec after last unit of insulin in  Keep the insulin in use out of the fridge  Start checking sugars 1-2x a day, rotating check times.  Please return in 1.5-2 months with your sugar log.   - today, HbA1c is 7.5%  (slightly better) - continue checking sugars at different times of the day - check 1-2x a day, rotating checks - advised for yearly eye exams >> she is UTD - Return to clinic in 1.5-2 mo with sugar log   Amber Kingdom, MD  PhD [facility]

## 2017-02-19 NOTE — Patient Instructions (Addendum)
Please continue: - Januvia 100 mg before b'fast - Metformin XR 1000 mg with dinner  Stop Jardiance.  Start Lantus 8 units at bedtime. Increase to 10 units in 3 days if sugars in am are not <140.  When injecting insulin:  Inject in the abdomen  Rotate the injection sites around the belly button  Change needle for each injection  Keep needle in for 10 sec after last unit of insulin in  Keep the insulin in use out of the fridge  Start checking sugars 1-2x a day, rotating check times.  Please return in 1.5-2 months with your sugar log.

## 2017-02-19 NOTE — Addendum Note (Signed)
Addended by: Caprice Beaver T on: 02/19/2017 10:50 AM   Modules accepted: Orders

## 2017-03-02 ENCOUNTER — Encounter: Payer: Self-pay | Admitting: Family Medicine

## 2017-03-03 ENCOUNTER — Encounter: Payer: Self-pay | Admitting: Internal Medicine

## 2017-04-01 ENCOUNTER — Other Ambulatory Visit: Payer: Self-pay | Admitting: Physician Assistant

## 2017-04-01 ENCOUNTER — Other Ambulatory Visit: Payer: Self-pay | Admitting: Family Medicine

## 2017-04-02 MED ORDER — ALPRAZOLAM 0.5 MG PO TABS: ORAL_TABLET | ORAL | 0 refills | Status: DC

## 2017-04-02 NOTE — Telephone Encounter (Signed)
Last OV 11/24/16 Alprazolam last filled 12/11/16 #90 with 0

## 2017-04-02 NOTE — Telephone Encounter (Signed)
Medication filled to pharmacy as requested.   

## 2017-04-28 ENCOUNTER — Encounter: Payer: Self-pay | Admitting: Internal Medicine

## 2017-04-28 ENCOUNTER — Ambulatory Visit (INDEPENDENT_AMBULATORY_CARE_PROVIDER_SITE_OTHER): Payer: BC Managed Care – PPO | Admitting: Internal Medicine

## 2017-04-28 VITALS — BP 120/68 | HR 91 | Wt 139.0 lb

## 2017-04-28 DIAGNOSIS — E1165 Type 2 diabetes mellitus with hyperglycemia: Secondary | ICD-10-CM

## 2017-04-28 DIAGNOSIS — E7800 Pure hypercholesterolemia, unspecified: Secondary | ICD-10-CM

## 2017-04-28 DIAGNOSIS — Z794 Long term (current) use of insulin: Secondary | ICD-10-CM

## 2017-04-28 LAB — POCT GLYCOSYLATED HEMOGLOBIN (HGB A1C): HEMOGLOBIN A1C: 6.5

## 2017-04-28 MED ORDER — INSULIN GLARGINE 100 UNIT/ML SOLOSTAR PEN: 5.0000 [IU] | PEN_INJECTOR | Freq: Every day | SUBCUTANEOUS | 11 refills | Status: DC

## 2017-04-28 NOTE — Patient Instructions (Addendum)
Please continue: - Januvia 100 mg before b'fast - Metformin XR 1000 mg with dinner  Please move: - Lantus 5 units to am  Please return in 3 months with your sugar log.

## 2017-04-28 NOTE — Progress Notes (Signed)
Patient ID: Amber Washington, female   DOB: 1955/10/19, 61 y.o.   MRN: [id number]   HPI: Amber Washington is a 61 y.o.-year-old female, returning for f/u for DM s/p distal pancreatectomy for a pancreatic cyst, dx in 2000, non-insulin-dependent, uncontrolled, without long term complications.  Last visit 2 mo ago.  At last visit, we started Lantus 8 units >> we had to back off the dose since then b/c lows: 59, 64. Now on 5 units at night. Still has occasional low CBGs at night.  Last hemoglobin A1c was: Lab Results  Component Value Date   HGBA1C 7.5 02/19/2017   HGBA1C 7.8 (H) 10/27/2016   HGBA1C 7.5 (H) 06/26/2016   Component     Latest Ref Rng & Units 12/04/2016  C-Peptide     0.80 - 3.85 ng/mL 0.94  Glucose, Fasting     65 - 99 mg/dL 160 (H)   She is now on: - Januvia 100 mg before b'fast - Metformin XR 1000 mg with dinner - Lantus 5 units daily at bedtime She was on Jardiance 25 mg before b'fast >> yeast inf's >> stopped at last visit  Pt checks sugars 1-2x a day: - am: 132, 160 >> 130-169 >> 71, 81-115, 127 - 2h after b'fast: n/c >> 130 - before lunch: n/c >> 119-156 >> 83-121 - 2h after lunch: n/c >> 216 >> 228 - before dinner: n/c  >> 105-172, 204 >> 99-177 (snack) - 2h after dinner: n/c >> 179-247 >> 182 - bedtime: n/c >> 152-244 >> 124--186, 228 - nighttime: n/c >> 51, 69, one time not checking >> got glu tablets  Glucometer: Bayer  Pt's meals are: - Breakfast: oatmeal + yoghurt + crackers - Lunch: salads + meat/cheese/crackers + fruit - Dinner: chicken + veggies or salad - Snacks: nuts , crackers, carrots - 1-x a day Drinks water only.  She exercises 5-6x a week: walking, lifting weights.  - No CKD, last BUN/creatinine:  Lab Results  Component Value Date   BUN 16 10/27/2016   BUN 25 (H) 06/26/2016   CREATININE 0.86 10/27/2016   CREATININE 0.84 06/26/2016  On Losartan. - last set of lipids: Lab Results  Component Value Date   CHOL 164 10/27/2016   HDL 56.50  10/27/2016   LDLCALC 94 10/27/2016   TRIG 69.0 10/27/2016   CHOLHDL 3 10/27/2016  On Lipitor. - last eye exam was in 05/2016 >> No DR. - denies  numbness and tingling in her feet.  She also has HTN and HL.  ROS: Constitutional: no weight gain/no weight loss, no fatigue, no subjective hyperthermia, no subjective hypothermia Eyes: no blurry vision, no xerophthalmia ENT: no sore throat, no nodules palpated in throat, no dysphagia, no odynophagia, no hoarseness Cardiovascular: no CP/no SOB/no palpitations/no leg swelling Respiratory: no cough/no SOB/no wheezing Gastrointestinal: no N/no V/no D/no C/no acid reflux Musculoskeletal: no muscle aches/no joint aches Skin: no rashes, no hair loss Neurological: no tremors/no numbness/no tingling/no dizziness  I reviewed pt's medications, allergies, PMH, social hx, family hx, and changes were documented in the history of present illness. Otherwise, unchanged from my initial visit note.  Past Medical History:  Diagnosis Date  . Allergic rhinitis   . Anxiety state, unspecified   . Asthma   . Benign neoplasm of pancreas, except islets of Langerhans   . Borderline hypertension   . Hypercholesteremia   . Malignant neoplasm of breast (female), unspecified site   . Osteoporosis, unspecified   . Stenosis of nasolacrimal duct, acquired   .  Type II or unspecified type diabetes mellitus without mention of complication, not stated as uncontrolled    Past Surgical History:  Procedure Laterality Date  . 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 right breast cancer sugery  1995  . surgery on neck for removal of birthmark     Social History   Social History  . Marital status: Married    Spouse name: N/A  . Number of children: 2   Occupational History  . Bookkeeper   Social History Main Topics  . Smoking status: Never Smoker  . Smokeless tobacco: Never Used  . Alcohol use No  . Drug use: No    Current Outpatient Prescriptions on File Prior to Visit  Medication Sig Dispense Refill  . albuterol (PROVENTIL HFA;VENTOLIN HFA) 108 (90 Base) MCG/ACT inhaler Inhale 2 puffs into the lungs every 6 (six) hours as needed for wheezing or shortness of breath. 1 Inhaler 2  . ALPRAZolam (XANAX) 0.5 MG tablet TAKE ONE TABLET BY MOUTH THREE TIMES DAILY AS NEEDED FOR SLEEP OR FOR ANXIETY 90 tablet 0  . aspirin 81 MG tablet Take 81 mg by mouth daily.      Marland Kitchen atorvastatin (LIPITOR) 10 MG tablet TAKE 1 TABLET DAILY 90 tablet 1  . BAYER MICROLET LANCETS lancets Use 1-2x a DAY  - for Bayer Contour 100 each 12  . budesonide (PULMICORT) 180 MCG/ACT inhaler Inhale 1 puff into the lungs 2 (two) times daily. 1 Inhaler 0  . Cholecalciferol (VITAMIN D) 2000 UNITS CAPS Take 1 capsule by mouth daily.      Marland Kitchen glucose blood test strip Use 1-2x a day - Bayer Contour 100 each 12  . Insulin Glargine (LANTUS SOLOSTAR) 100 UNIT/ML Solostar Pen Inject 10 Units into the skin daily at 10 pm. 5 pen 11  . Insulin Pen Needle (CAREFINE PEN NEEDLES) 32G X 4 MM MISC Use 1x a day 100 each 3  . losartan (COZAAR) 50 MG tablet TAKE 1 TABLET DAILY 90 tablet 1  . metFORMIN (GLUCOPHAGE-XR) 500 MG 24 hr tablet Take 2 tablets (1,000 mg total) by mouth daily with supper. 180 tablet 3  . montelukast (SINGULAIR) 10 MG tablet TAKE 1 TABLET AT BEDTIME 90 tablet 1  . sitaGLIPtin (JANUVIA) 100 MG tablet Take 1 tablet (100 mg total) by mouth daily. 30 tablet 5   No current facility-administered medications on file prior to visit.    Allergies  Allergen Reactions  . Influenza Vaccines Other (See Comments)    Unknown, allergic to egg whites   FH: - mother - leukemia, father - liver cancer - mother - HTN - DM in nephew.  PE: BP 120/68 (BP Location: Left Arm, Patient Position: Sitting)   Pulse 91   Wt 139 lb (63 kg)   SpO2 98%   BMI 21.13 kg/m  Wt Readings from Last 3 Encounters:  04/28/17 139 lb (63 kg)  02/19/17 134 lb (60.8 kg)   12/04/16 142 lb 9.6 oz (64.7 kg)   Constitutional: normal weight, in NAD Eyes: PERRLA, EOMI, no exophthalmos, arcus cornealis ENT: moist mucous membranes, no thyromegaly, no cervical lymphadenopathy Cardiovascular: RRR, No MRG Respiratory: CTA B Gastrointestinal: abdomen soft, NT, ND, BS+ Musculoskeletal: no deformities, strength intact in all 4 Skin: moist, warm, no rashes Neurological: no tremor with outstretched hands, DTR normal in all 4  ASSESSMENT: 1. DM2, insulin-dependent, uncontrolled, without long term complications, but with hyperglycemia  Component     Latest Ref Rng &  Units 12/04/2016  C-Peptide     0.80 - 3.85 ng/mL 0.94  Glucose, Fasting     65 - 99 mg/dL 160 (H)  C-peptide is not very high, but is still in the normal range.   She has a history of distal pancreatic resection (?%) For a benign cyst and she developed diabetes soon after the surgery.  2. HL  PLAN:  1. Patient with long-standing, Uncontrolled diabetes, on oral antidiabetic regimen, to which we added a low dose insulin at last visit as her C-peptide was lower then expected for a glucose of 160. We discussed at this visit that she does have a degree of insulin deficiency, but this is not pronounced. We initially started Lantus at 8 units per day, however, she developed lows and we had to back off to 5 units daily. Even on this very low dose, she still has occasional low blood sugars at night, so at this visit, I advised her to move the Lantus to a.m. - We will continue the current regimen except for the change above - I suggested to:  Patient Instructions  Please continue: - Januvia 100 mg before b'fast - Metformin XR 1000 mg with dinner  Please move: - Lantus 5 units to am  Please return in 3 months with your sugar log.    - reviewed last HbA1c of 7.5% (still above target) >> per her request, we checked it again today and this was 6.5%, much better  - today, HbA1c is 7%  - continue checking  sugars at different times of the day - check 1-2x a day, rotating checks - advised for yearly eye exams >> she is UTD - Return to clinic in 3 mo with sugar log   2. HL - on Lipitor - has arcus cornealis - Lipids checked earlier this year were at goal   Philemon Kingdom, MD PhD [facility] Endocrinology

## 2017-04-28 NOTE — Addendum Note (Signed)
Addended by: Caprice Beaver T on: 04/28/2017 09:03 AM   Modules accepted: Orders

## 2017-04-30 ENCOUNTER — Encounter: Payer: Self-pay | Admitting: Family Medicine

## 2017-05-01 MED ORDER — MONTELUKAST SODIUM 10 MG PO TABS: 10.0000 mg | ORAL_TABLET | Freq: Every day | ORAL | 1 refills | Status: DC

## 2017-05-12 ENCOUNTER — Other Ambulatory Visit: Payer: Self-pay | Admitting: Gynecology

## 2017-05-12 DIAGNOSIS — Z1231 Encounter for screening mammogram for malignant neoplasm of breast: Secondary | ICD-10-CM

## 2017-05-28 ENCOUNTER — Other Ambulatory Visit: Payer: Self-pay | Admitting: Internal Medicine

## 2017-06-04 ENCOUNTER — Ambulatory Visit
Admission: RE | Admit: 2017-06-04 | Discharge: 2017-06-04 | Disposition: A | Discharge status: Home / Self Care | Payer: BC Managed Care – PPO | Source: Ambulatory Visit | Attending: Gynecology | Admitting: Gynecology

## 2017-06-04 DIAGNOSIS — Z1231 Encounter for screening mammogram for malignant neoplasm of breast: Secondary | ICD-10-CM

## 2017-06-04 HISTORY — DX: Personal history of antineoplastic chemotherapy: Z92.21

## 2017-06-04 HISTORY — DX: Personal history of irradiation: Z92.3

## 2017-06-04 LAB — HM DIABETES EYE EXAM

## 2017-06-15 ENCOUNTER — Encounter: Payer: Self-pay | Admitting: General Practice

## 2017-06-17 ENCOUNTER — Other Ambulatory Visit: Payer: Self-pay | Admitting: Family Medicine

## 2017-06-18 MED ORDER — ALPRAZOLAM 0.5 MG PO TABS: ORAL_TABLET | ORAL | 0 refills | Status: DC

## 2017-06-18 NOTE — Telephone Encounter (Signed)
Medication filled to pharmacy as requested.   

## 2017-06-18 NOTE — Telephone Encounter (Signed)
Last OV 11/24/16 (asthma) Alprazolam last filled 04/02/17 #90 with 0

## 2017-06-26 ENCOUNTER — Other Ambulatory Visit: Payer: Self-pay

## 2017-06-26 ENCOUNTER — Encounter: Payer: Self-pay | Admitting: Internal Medicine

## 2017-06-26 MED ORDER — BASAGLAR KWIKPEN 100 UNIT/ML ~~LOC~~ SOPN: PEN_INJECTOR | SUBCUTANEOUS | 0 refills | Status: DC

## 2017-06-29 ENCOUNTER — Ambulatory Visit (INDEPENDENT_AMBULATORY_CARE_PROVIDER_SITE_OTHER): Payer: BC Managed Care – PPO | Admitting: Family Medicine

## 2017-06-29 ENCOUNTER — Encounter: Payer: Self-pay | Admitting: Family Medicine

## 2017-06-29 VITALS — BP 120/68 | HR 74 | Temp 98.1°F | Resp 16 | Ht 68.0 in | Wt 142.0 lb

## 2017-06-29 DIAGNOSIS — I10 Essential (primary) hypertension: Secondary | ICD-10-CM

## 2017-06-29 DIAGNOSIS — Z0000 Encounter for general adult medical examination without abnormal findings: Secondary | ICD-10-CM | POA: Diagnosis not present

## 2017-06-29 LAB — BASIC METABOLIC PANEL
BUN: 19 mg/dL (ref 6–23)
CO2: 32 meq/L (ref 19–32)
Calcium: 9.7 mg/dL (ref 8.4–10.5)
Chloride: 100 mEq/L (ref 96–112)
Creatinine, Ser: 0.66 mg/dL (ref 0.40–1.20)
GFR: 97.43 mL/min (ref >60.00)
GLUCOSE: 147 mg/dL — AB (ref 70–99)
POTASSIUM: 4.2 meq/L (ref 3.5–5.1)
SODIUM: 140 meq/L (ref 135–145)

## 2017-06-29 LAB — HEPATIC FUNCTION PANEL
ALT: 13 U/L (ref 0–35)
AST: 14 U/L (ref 0–37)
Albumin: 4.1 g/dL (ref 3.5–5.2)
Alkaline Phosphatase: 64 U/L (ref 39–117)
BILIRUBIN DIRECT: 0.1 mg/dL (ref 0.0–0.3)
BILIRUBIN TOTAL: 0.6 mg/dL (ref 0.2–1.2)
Total Protein: 6.8 g/dL (ref 6.0–8.3)

## 2017-06-29 LAB — LIPID PANEL
CHOLESTEROL: 124 mg/dL (ref 0–200)
HDL: 47.1 mg/dL (ref >39.00)
LDL Cholesterol: 48 mg/dL (ref 0–99)
NonHDL: 76.43
TRIGLYCERIDES: 144 mg/dL (ref 0.0–149.0)
Total CHOL/HDL Ratio: 3
VLDL: 28.8 mg/dL (ref 0.0–40.0)

## 2017-06-29 LAB — CBC WITH DIFFERENTIAL/PLATELET
BASOS ABS: 0.1 10*3/uL (ref 0.0–0.1)
BASOS PCT: 1.1 % (ref 0.0–3.0)
Eosinophils Absolute: 0.2 10*3/uL (ref 0.0–0.7)
Eosinophils Relative: 3.1 % (ref 0.0–5.0)
HEMATOCRIT: 37.9 % (ref 36.0–46.0)
HEMOGLOBIN: 12.7 g/dL (ref 12.0–15.0)
LYMPHS PCT: 29.8 % (ref 12.0–46.0)
Lymphs Abs: 2.1 10*3/uL (ref 0.7–4.0)
MCHC: 33.6 g/dL (ref 30.0–36.0)
MCV: 90.4 fl (ref 78.0–100.0)
MONOS PCT: 9.6 % (ref 3.0–12.0)
Monocytes Absolute: 0.7 10*3/uL (ref 0.1–1.0)
NEUTROS ABS: 4 10*3/uL (ref 1.4–7.7)
Neutrophils Relative %: 56.4 % (ref 43.0–77.0)
PLATELETS: 324 10*3/uL (ref 150.0–400.0)
RBC: 4.19 Mil/uL (ref 3.87–5.11)
RDW: 12.7 % (ref 11.5–15.5)
WBC: 7.1 10*3/uL (ref 4.0–10.5)

## 2017-06-29 LAB — TSH: TSH: 1.33 u[IU]/mL (ref 0.35–4.50)

## 2017-06-29 NOTE — Patient Instructions (Signed)
Follow up in 6 months to recheck cholesterol and blood pressure We'll notify you of your lab results and make any changes if needed Continue to work on healthy diet and regular exercise- you look great!!! Call with any questions or concerns Happy Early Rudene Anda!!!

## 2017-06-29 NOTE — Progress Notes (Signed)
   Subjective:    Patient ID: Amber Washington, female    DOB: 11/08/1955, 61 y.o.   MRN: [id number]  HPI CPE- UTD on pap, mammo, colonoscopy.  UTD on Tdap, Pneumovax.  Declines flu shot.  UTD on eye exam, due for foot exam.   Review of Systems Patient reports no vision/ hearing changes, adenopathy,fever, weight change,  persistant/recurrent hoarseness , swallowing issues, chest pain, palpitations, edema, persistant/recurrent cough, hemoptysis, dyspnea (rest/exertional/paroxysmal nocturnal), gastrointestinal bleeding (melena, rectal bleeding), abdominal pain, significant heartburn, bowel changes, GU symptoms (dysuria, hematuria, incontinence), Gyn symptoms (abnormal  bleeding, pain),  syncope, focal weakness, memory loss, numbness & tingling, skin/hair/nail changes, abnormal bruising or bleeding, anxiety, or depression.     Objective:   Physical Exam General Appearance:    Alert, cooperative, no distress, appears stated age  Head:    Normocephalic, without obvious abnormality, atraumatic  Eyes:    PERRL, conjunctiva/corneas clear, EOM's intact, fundi    benign, both eyes  Ears:    Normal TM's and external ear canals, both ears  Nose:   Nares normal, septum midline, mucosa normal, no drainage    or sinus tenderness  Throat:   Lips, mucosa, and tongue normal; teeth and gums normal  Neck:   Supple, symmetrical, trachea midline, no adenopathy;    Thyroid: no enlargement/tenderness/nodules  Back:     Symmetric, no curvature, ROM normal, no CVA tenderness  Lungs:     Clear to auscultation bilaterally, respirations unlabored  Chest Wall:    No tenderness or deformity   Heart:    Regular rate and rhythm, S1 and S2 normal, no murmur, rub   or gallop  Breast Exam:    Deferred to GYN  Abdomen:     Soft, non-tender, bowel sounds active all four quadrants,    no masses, no organomegaly  Genitalia:    Deferred to GYN  Rectal:    Extremities:   Extremities normal, atraumatic, no cyanosis or edema    Pulses:   2+ and symmetric all extremities  Skin:   Skin color, texture, turgor normal, no rashes or lesions  Lymph nodes:   Cervical, supraclavicular, and axillary nodes normal  Neurologic:   CNII-XII intact, normal strength, sensation and reflexes    throughout          Assessment & Plan:

## 2017-06-29 NOTE — Assessment & Plan Note (Signed)
Pt's PE WNL.  UTD on GYN, colonoscopy, Tdap, Pneumovax.  Unable to take flu shots due to allergy.  Check labs.  Anticipatory guidance provided.

## 2017-06-29 NOTE — Assessment & Plan Note (Signed)
Chronic problem.  Well controlled.  Asymptomatic.  Check labs.  No anticipated med changes. 

## 2017-06-30 ENCOUNTER — Encounter: Payer: Self-pay | Admitting: General Practice

## 2017-07-29 ENCOUNTER — Ambulatory Visit: Payer: BC Managed Care – PPO | Admitting: Internal Medicine

## 2017-07-29 ENCOUNTER — Encounter: Payer: Self-pay | Admitting: Internal Medicine

## 2017-07-29 VITALS — BP 128/72 | HR 99 | Wt 144.0 lb

## 2017-07-29 DIAGNOSIS — Z794 Long term (current) use of insulin: Secondary | ICD-10-CM

## 2017-07-29 DIAGNOSIS — E7800 Pure hypercholesterolemia, unspecified: Secondary | ICD-10-CM | POA: Diagnosis not present

## 2017-07-29 DIAGNOSIS — E1165 Type 2 diabetes mellitus with hyperglycemia: Secondary | ICD-10-CM

## 2017-07-29 LAB — POCT GLYCOSYLATED HEMOGLOBIN (HGB A1C): HEMOGLOBIN A1C: 6.6

## 2017-07-29 NOTE — Patient Instructions (Signed)
Please continue: - Januvia 100 mg before b'fast - Metformin XR 1000 mg with dinner - Lantus 5 units in am  Please return in 4 months with your sugar log.

## 2017-07-29 NOTE — Progress Notes (Signed)
Patient ID: LAYNEY GILLSON, female   DOB: 04-18-1956, 61 y.o.   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  Last visit 3 mo ago.  Last hemoglobin A1c was: Lab Results  Component Value Date   HGBA1C 6.5 04/28/2017   HGBA1C 7.5 02/19/2017   HGBA1C 7.8 (H) 10/27/2016   She is now on: - Januvia 100 mg before b'fast - Metformin XR 1000 mg with dinner - Lantus >> Basaglar 5 units daily at bedtime >> in am She was on Jardiance 25 mg before b'fast >> yeast inf's >> stopped at last visit  Pt checks sugars 1-2x a day: - am: 132, 160 >> 130-169 >> 71, 81-115, 127 >> 88, 93-126, 143 - 2h after b'fast: n/c >> 130 >> 126 - before lunch: n/c >> 119-156 >> 83-121 >> 98-103, 182 - 2h after lunch: n/c >> 216 >> 228 >> n/c - before dinner: n/c  >> 105-172, 204 >> 99-177 (snack) >> 78, 90-118, 167, 271 - 2h after dinner: n/c >> 179-247 >> 182 >> n/c - bedtime: n/c >> 152-244 >> 124--186, 228 >> 100-161, 233 - nighttime: n/c >> 51, 69, one time not checking >> got glu tablets >> n/c  Glucometer: Bayer  Pt's meals are: - Breakfast: oatmeal + yoghurt + crackers - Lunch: salads + meat/cheese/crackers + fruit - Dinner: chicken + veggies or salad - Snacks: nuts , crackers, carrots - 1-x a day Drinks water only.  She exercises: Walking, lifting weights. Now less.  -No CKD, last BUN/creatinine:  Lab Results  Component Value Date   BUN 19 06/29/2017   BUN 16 10/27/2016   CREATININE 0.66 06/29/2017   CREATININE 0.86 10/27/2016   Lab Results  Component Value Date   MICRALBCREAT 3.4 06/28/2012   MICRALBCREAT 2.3 01/27/2011   MICRALBCREAT 3.2 02/05/2010  On losartan.  -+ HL-controlled; last set of lipids: Lab Results  Component Value Date   CHOL 124 06/29/2017   HDL 47.10 06/29/2017   LDLCALC 48 06/29/2017   TRIG 144.0  06/29/2017   CHOLHDL 3 06/29/2017  On Lipitor.  - last eye exam was in 05/2017 >> no DR  - no numbness and tingling in her feet.  She also has HTN and HL.  ROS: Constitutional: no weight gain/no weight loss, no fatigue, no subjective hyperthermia, no subjective hypothermia Eyes: no blurry vision, no xerophthalmia ENT: no sore throat, no nodules palpated in throat, no dysphagia, no odynophagia, no hoarseness Cardiovascular: no CP/no SOB/no palpitations/no leg swelling Respiratory: no cough/no SOB/no wheezing Gastrointestinal: no N/no V/no D/no C/no acid reflux Musculoskeletal: no muscle aches/no joint aches Skin: no rashes, no hair loss Neurological: no tremors/no numbness/no tingling/no dizziness  I reviewed pt's medications, allergies, PMH, social hx, family hx, and changes were documented in the history of present illness. Otherwise, unchanged from my initial visit note.  Past Medical History:  Diagnosis Date  . Allergic rhinitis   . Anxiety state, unspecified   . Asthma   . Benign neoplasm of pancreas, except islets of Langerhans   . Borderline hypertension   . Hypercholesteremia   . Malignant neoplasm of breast (female), unspecified site   . Osteoporosis, unspecified   . Personal history of chemotherapy   . Personal history of radiation therapy   . Stenosis of nasolacrimal duct, acquired   . Type II or unspecified type  diabetes mellitus without mention of complication, not stated as uncontrolled    Past Surgical History:  Procedure Laterality Date  . BREAST BIOPSY    . BREAST EXCISIONAL BIOPSY    . BREAST LUMPECTOMY     [id number] 1995  . 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 right breast cancer sugery  1995  . surgery on neck for removal of birthmark     Social History   Social History  . Marital status: Married    Spouse name: N/A  . Number of children: 2   Occupational History  . Bookkeeper   Social History  Main Topics  . Smoking status: Never Smoker  . Smokeless tobacco: Never Used  . Alcohol use No  . Drug use: No   Current Outpatient Medications on File Prior to Visit  Medication Sig Dispense Refill  . albuterol (PROVENTIL HFA;VENTOLIN HFA) 108 (90 Base) MCG/ACT inhaler Inhale 2 puffs into the lungs every 6 (six) hours as needed for wheezing or shortness of breath. 1 Inhaler 2  . ALPRAZolam (XANAX) 0.5 MG tablet TAKE ONE TABLET BY MOUTH THREE TIMES DAILY AS NEEDED FOR SLEEP OR FOR ANXIETY 90 tablet 0  . aspirin 81 MG tablet Take 81 mg by mouth daily.      Marland Kitchen atorvastatin (LIPITOR) 10 MG tablet TAKE 1 TABLET DAILY 90 tablet 1  . BAYER MICROLET LANCETS lancets Use 1-2x a DAY  - for Bayer Contour 100 each 12  . budesonide (PULMICORT) 180 MCG/ACT inhaler Inhale 1 puff into the lungs 2 (two) times daily. 1 Inhaler 0  . Cholecalciferol (VITAMIN D) 2000 UNITS CAPS Take 1 capsule by mouth daily.      Marland Kitchen glucose blood test strip Use 1-2x a day - Bayer Contour 100 each 12  . Insulin Glargine (BASAGLAR KWIKPEN) 100 UNIT/ML SOPN Inject 5 Units into the skin daily before breakfast. 5 pen 0  . Insulin Pen Needle (CAREFINE PEN NEEDLES) 32G X 4 MM MISC Use 1x a day 100 each 3  . JANUVIA 100 MG tablet TAKE 1 TABLET BY MOUTH ONCE DAILY 30 tablet 5  . losartan (COZAAR) 50 MG tablet TAKE 1 TABLET DAILY 90 tablet 1  . metFORMIN (GLUCOPHAGE-XR) 500 MG 24 hr tablet Take 2 tablets (1,000 mg total) by mouth daily with supper. 180 tablet 3  . montelukast (SINGULAIR) 10 MG tablet Take 1 tablet (10 mg total) by mouth at bedtime. 90 tablet 1   No current facility-administered medications on file prior to visit.    Allergies  Allergen Reactions  . Influenza Vaccines Other (See Comments)    Unknown, allergic to egg whites   FH: - mother - leukemia, father - liver cancer - mother - HTN - DM in nephew.  PE: There were no vitals taken for this visit. Wt Readings from Last 3 Encounters:  06/29/17 142 lb (64.4 kg)   04/28/17 139 lb (63 kg)  02/19/17 134 lb (60.8 kg)   Constitutional: normal weight, in NAD Eyes: PERRLA, EOMI, no exophthalmos ENT: moist mucous membranes, no thyromegaly, no cervical lymphadenopathy Cardiovascular: RRR, No MRG Respiratory: CTA B Gastrointestinal: abdomen soft, NT, ND, BS+ Musculoskeletal: no deformities, strength intact in all 4 Skin: moist, warm, no rashes Neurological: no tremor with outstretched hands, DTR normal in all 4   ASSESSMENT: 1. DM, insulin-dependent, uncontrolled, without long term complications, but with hyperglycemia  Component     Latest Ref Rng & Units 12/04/2016  C-Peptide  0.80 - 3.85 ng/mL 0.94  Glucose, Fasting     65 - 99 mg/dL 160 (H)  C-peptide is not very high, but is still in the normal range.   She has a history of distal pancreatic resection (?%) For a benign cyst and she developed diabetes soon after the surgery.  2. HL  PLAN:  1. Patient with long history of diabetes distal pancreatectomy for pancreatic cyst.  Her sugars were suboptimally controlled in the past, but they improved after starting insulin low dose.  She actually developed low blood sugars and we had to decrease the dose of Lantus to 5 units daily.  At last visit, we moved the dose in the morning since she occasionally developed low blood sugars at night. - no more low CBGs after the above change but sugars in am a little higher (still at goal) - she continues to have occasional hyperglycemic spikes, but now frequent enough to be worrisome - we can continue current regimen - I suggested to:  Patient Instructions  Please continue: - Januvia 100 mg before b'fast - Metformin XR 1000 mg with dinner - Lantus 5 units in am  Please return in 4 months with your sugar log.   - today, HbA1c is 6.6% (a little higher) - continue checking sugars at different times of the day - check 1x a day, rotating checks - advised for yearly eye exams >> she is UTD - allergic to  flu shot - Return to clinic in 4 mo with sugar log   2. HL - Reviewed lipid panel from last month and all lipid fractions were normal - She continues on Lipitor without side effects - Has arcus Cornealis  Philemon Kingdom, MD PhD [facility] Endocrinology

## 2017-07-29 NOTE — Addendum Note (Signed)
Addended by: Caprice Beaver T on: 07/29/2017 09:58 AM   Modules accepted: Orders

## 2017-09-09 ENCOUNTER — Encounter: Payer: Self-pay | Admitting: Family Medicine

## 2017-09-09 ENCOUNTER — Other Ambulatory Visit: Payer: Self-pay

## 2017-09-09 ENCOUNTER — Ambulatory Visit: Payer: BC Managed Care – PPO | Admitting: Family Medicine

## 2017-09-09 VITALS — BP 120/80 | HR 78 | Temp 98.8°F | Resp 17 | Ht 68.0 in | Wt 146.5 lb

## 2017-09-09 DIAGNOSIS — J4541 Moderate persistent asthma with (acute) exacerbation: Secondary | ICD-10-CM | POA: Diagnosis not present

## 2017-09-09 MED ORDER — BUDESONIDE-FORMOTEROL FUMARATE 80-4.5 MCG/ACT IN AERO: 2.0000 | INHALATION_SPRAY | Freq: Two times a day (BID) | RESPIRATORY_TRACT | 3 refills | Status: DC

## 2017-09-09 MED ORDER — IPRATROPIUM-ALBUTEROL 0.5-2.5 (3) MG/3ML IN SOLN
3.0000 mL | Freq: Once | RESPIRATORY_TRACT | Status: AC
  Administered 2017-09-09: 3 mL via RESPIRATORY_TRACT

## 2017-09-09 NOTE — Progress Notes (Signed)
   Subjective:    Patient ID: Amber Washington, female    DOB: [birth year], 62 y.o.   MRN: [id number]  HPI Asthma- chronic problem, on Singulair and Pulmicort daily but chest is tight.  sxs worsened 3-4 weeks ago and has been using Albuterol inhaler regularly.  No recent illness.  Pt reports mild wheezing.  + SOB.  Winded w/ long conversation, climbing stairs.  Denies any other sxs.   Review of Systems For ROS see HPI     Objective:   Physical Exam  Constitutional: She appears well-developed and well-nourished. No distress.  HENT:  Head: Normocephalic and atraumatic.  TMs normal bilaterally Mild nasal congestion Throat w/out erythema, edema, or exudate  Eyes: Conjunctivae and EOM are normal. Pupils are equal, round, and reactive to light.  Neck: Normal range of motion. Neck supple.  Cardiovascular: Normal rate, regular rhythm, normal heart sounds and intact distal pulses.  No murmur heard. Pulmonary/Chest: Effort normal and breath sounds normal. No respiratory distress. She has no wheezes.  Decreased breath sounds diffusely throughout- improved s/p neb tx  Lymphadenopathy:    She has no cervical adenopathy.          Assessment & Plan:

## 2017-09-09 NOTE — Assessment & Plan Note (Signed)
Deteriorated.  Pt is in current exacerbation.  Trying to avoid Prednisone given pt's diabetes.  Stop pulmicort and start Symbicort.  Reviewed supportive care and red flags that should prompt return.  Pt expressed understanding and is in agreement w/ plan.

## 2017-09-09 NOTE — Patient Instructions (Signed)
Follow up by phone/MyChart in 1 week to let me know how you're doing STOP the Pulmicort START the Symbicort twice daily as directed- this should decrease your need for the rescue inhaler Continue the Albuterol as needed The next step is oral steroids which I'm hoping to avoid b/c it will cause your sugar to go up- but we can do it if we have to Call with any questions or concerns Hang in there! Happy New Year!

## 2017-09-14 ENCOUNTER — Other Ambulatory Visit: Payer: Self-pay | Admitting: Family Medicine

## 2017-09-15 MED ORDER — ALPRAZOLAM 0.5 MG PO TABS: ORAL_TABLET | ORAL | 0 refills | Status: DC

## 2017-09-15 NOTE — Telephone Encounter (Signed)
Medication filled to pharmacy as requested.   

## 2017-09-15 NOTE — Telephone Encounter (Signed)
Last OV 09/09/17 Alprazolam last filled 06/18/17 #90 with 0

## 2017-10-12 ENCOUNTER — Encounter: Payer: Self-pay | Admitting: Family Medicine

## 2017-10-12 DIAGNOSIS — R51 Headache: Secondary | ICD-10-CM

## 2017-10-12 DIAGNOSIS — J329 Chronic sinusitis, unspecified: Principal | ICD-10-CM

## 2017-10-12 DIAGNOSIS — B9689 Other specified bacterial agents as the cause of diseases classified elsewhere: Secondary | ICD-10-CM

## 2017-10-12 DIAGNOSIS — R519 Headache, unspecified: Secondary | ICD-10-CM

## 2017-10-12 DIAGNOSIS — H938X3 Other specified disorders of ear, bilateral: Secondary | ICD-10-CM

## 2017-10-13 ENCOUNTER — Encounter: Payer: Self-pay | Admitting: Family Medicine

## 2017-10-13 ENCOUNTER — Other Ambulatory Visit: Payer: Self-pay

## 2017-10-13 ENCOUNTER — Ambulatory Visit: Payer: BC Managed Care – PPO | Admitting: Family Medicine

## 2017-10-13 VITALS — BP 130/81 | HR 56 | Temp 97.9°F | Resp 16 | Ht 68.0 in | Wt 145.4 lb

## 2017-10-13 DIAGNOSIS — B9689 Other specified bacterial agents as the cause of diseases classified elsewhere: Secondary | ICD-10-CM | POA: Diagnosis not present

## 2017-10-13 DIAGNOSIS — J329 Chronic sinusitis, unspecified: Secondary | ICD-10-CM | POA: Diagnosis not present

## 2017-10-13 DIAGNOSIS — Z23 Encounter for immunization: Secondary | ICD-10-CM | POA: Diagnosis not present

## 2017-10-13 MED ORDER — AMOXICILLIN 875 MG PO TABS
875.0000 mg | ORAL_TABLET | Freq: Two times a day (BID) | ORAL | 0 refills | Status: DC
Start: 2017-10-13 — End: 2017-12-01

## 2017-10-13 MED FILL — AMOXICILLIN 875 MG TABLET: 875 | 10 days supply | Qty: 20 | Fill #0

## 2017-10-13 NOTE — Progress Notes (Signed)
   Subjective:    Patient ID: Amber Washington, female    DOB: [birth year], 62 y.o.   MRN: [id number]  HPI Sinusitis- sxs started ~3 weeks ago.  + pressure, congestion, pain behind the eyes.  'my eyes just ache'.  Bilateral ear fullness.  No tooth pain.  + nasal congestion.  Subjective low grade temps.  + sick contacts.  No N/V.  No sore throat.   Review of Systems For ROS see HPI     Objective:   Physical Exam  Constitutional: She is oriented to person, place, and time. She appears well-developed and well-nourished. No distress.  HENT:  Head: Normocephalic and atraumatic.  Right Ear: Tympanic membrane normal.  Left Ear: Tympanic membrane normal.  Nose: Mucosal edema and rhinorrhea present. Right sinus exhibits maxillary sinus tenderness and frontal sinus tenderness. Left sinus exhibits maxillary sinus tenderness and frontal sinus tenderness.  Mouth/Throat: Uvula is midline and mucous membranes are normal. Posterior oropharyngeal erythema present. No oropharyngeal exudate.  Eyes: Conjunctivae and EOM are normal. Pupils are equal, round, and reactive to light.  Neck: Normal range of motion. Neck supple.  Cardiovascular: Normal rate, regular rhythm and normal heart sounds.  Pulmonary/Chest: Effort normal and breath sounds normal. No respiratory distress. She has no wheezes.  Lymphadenopathy:    She has no cervical adenopathy.  Neurological: She is alert and oriented to person, place, and time.  Vitals reviewed.         Assessment & Plan:  Bacterial sinusitis- new.  Pt's sxs and PE consistent w/ infxn.  Duration of illness suggests bacterial etiology.  Start abx.  Reviewed supportive care and red flags that should prompt return.  Pt expressed understanding and is in agreement w/ plan.

## 2017-10-13 NOTE — Addendum Note (Signed)
Addended by: Davis Gourd on: 10/13/2017 08:48 AM   Modules accepted: Orders

## 2017-10-13 NOTE — Patient Instructions (Signed)
Follow up as needed or as scheduled Start the Amoxicillin twice daily- take w/ food Drink plenty of fluids REST! Call with any questions or concerns Hang in there!!!

## 2017-10-20 MED ORDER — DOXYCYCLINE HYCLATE 100 MG PO TABS: 100.0000 mg | ORAL_TABLET | Freq: Two times a day (BID) | ORAL | 0 refills | Status: DC

## 2017-10-27 DIAGNOSIS — E119 Type 2 diabetes mellitus without complications: Secondary | ICD-10-CM | POA: Insufficient documentation

## 2017-10-27 NOTE — Addendum Note (Signed)
Addended by: Davis Gourd on: 10/27/2017 12:01 PM   Modules accepted: Orders

## 2017-11-02 ENCOUNTER — Encounter: Payer: Self-pay | Admitting: Family Medicine

## 2017-11-03 MED ORDER — MONTELUKAST SODIUM 10 MG PO TABS: 10.0000 mg | ORAL_TABLET | Freq: Every day | ORAL | 1 refills | Status: DC

## 2017-11-06 ENCOUNTER — Other Ambulatory Visit: Payer: Self-pay | Admitting: Internal Medicine

## 2017-11-17 DIAGNOSIS — J322 Chronic ethmoidal sinusitis: Secondary | ICD-10-CM | POA: Insufficient documentation

## 2017-12-01 ENCOUNTER — Ambulatory Visit: Payer: BC Managed Care – PPO | Admitting: Internal Medicine

## 2017-12-01 ENCOUNTER — Encounter: Payer: Self-pay | Admitting: Internal Medicine

## 2017-12-01 VITALS — BP 134/82 | HR 82 | Ht 68.0 in | Wt 148.2 lb

## 2017-12-01 DIAGNOSIS — E7800 Pure hypercholesterolemia, unspecified: Secondary | ICD-10-CM | POA: Diagnosis not present

## 2017-12-01 DIAGNOSIS — Z794 Long term (current) use of insulin: Secondary | ICD-10-CM | POA: Diagnosis not present

## 2017-12-01 DIAGNOSIS — E1165 Type 2 diabetes mellitus with hyperglycemia: Secondary | ICD-10-CM

## 2017-12-01 LAB — POCT GLYCOSYLATED HEMOGLOBIN (HGB A1C): HEMOGLOBIN A1C: 7.3

## 2017-12-01 MED ORDER — METFORMIN HCL ER 500 MG PO TB24: 1000.0000 mg | ORAL_TABLET | Freq: Every day | ORAL | 3 refills | Status: DC

## 2017-12-01 MED ORDER — SITAGLIPTIN PHOSPHATE 100 MG PO TABS: 100.0000 mg | ORAL_TABLET | Freq: Every day | ORAL | 11 refills | Status: DC

## 2017-12-01 NOTE — Progress Notes (Signed)
Patient ID: Amber Washington, female   DOB: 07-30-1956, 62 y.o.   MRN: [id number]   HPI: Amber Washington is a 62 y.o.-year-old female, returning for f/u for DM s/p distal pancreatectomy for a pancreatic cyst, dx in 2000, insulin-dependent, uncontrolled, without long term complications.  Last visit 4 months ago.  She had sinusitis >> URI >> ABx >> saw ENT >> on Prednisone high dose >> taper >> suars are higher. Now back to baseline.  She will have turbinate sx in June.  Last hemoglobin A1c was: Lab Results  Component Value Date   HGBA1C 6.6 07/29/2017   HGBA1C 6.5 04/28/2017   HGBA1C 7.5 02/19/2017   She is now on: - Januvia 100 mg before b'fast - Metformin ER 1000 mg with dinner - Lantus >> Basaglar 5 units daily at bedtime >> in am She was on Jardiance 25 mg before b'fast >> yeast inf's >> stopped.  Pt checks sugars 1-2 times a day: - am: 71, 81-115, 127 >> 88, 93-126, 143 >> 80, 106-126, Prednisone: 156-170 - 2h after b'fast: n/c >> 130 >> 126 >> 104 - before lunch: 83-121 >> 98-103, 182 >> 109 - 2h after lunch: n/c >> 216 >> 228 >> n/c - before dinner: 99-177 >> 78, 90-118, 167, 271 >> 94-118, Prednisone: 136-170 - 2h after dinner: 179-247 >> 182 >> n/c - bedtime: 124-186, 228 >> 100-161, 233 >> n/c - nighttime:  got glu tablets >> n/c  Glucometer: Bayer  Pt's meals are: - Breakfast: oatmeal + yoghurt + crackers - Lunch: salads + meat/cheese/crackers + fruit - Dinner: chicken + veggies or salad - Snacks: nuts , crackers, carrots - 1-x a day Drinks water only.  She exercises: Walking, lifting weights.  - no CKD, last BUN/creatinine:  Lab Results  Component Value Date   BUN 19 06/29/2017   BUN 16 10/27/2016   CREATININE 0.66 06/29/2017   CREATININE 0.86 10/27/2016   Lab Results  Component Value Date   MICRALBCREAT 3.4 06/28/2012   MICRALBCREAT 2.3 01/27/2011   MICRALBCREAT 3.2 02/05/2010  On  losartan.  -+ HL; last set of lipids: Lab Results  Component Value  Date   CHOL 124 06/29/2017   HDL 47.10 06/29/2017   LDLCALC 48 06/29/2017   TRIG 144.0 06/29/2017   CHOLHDL 3 06/29/2017  On Lipitor.  - last eye exam was in 05/2017: No DR  - no numbness and tingling in her feet.  She also has HTN.  ROS: Constitutional: no weight gain/no weight loss, no fatigue, no subjective hyperthermia, no subjective hypothermia Eyes: no blurry vision, no xerophthalmia ENT: no sore throat, no nodules palpated in throat, no dysphagia, no odynophagia, no hoarseness Cardiovascular: no CP/no SOB/no palpitations/no leg swelling Respiratory: no cough/no SOB/no wheezing/+ congestion Gastrointestinal: no N/no V/no D/no C/no acid reflux Musculoskeletal: no muscle aches/no joint aches Skin: no rashes, no hair loss Neurological: no tremors/no numbness/no tingling/no dizziness  I reviewed pt's medications, allergies, PMH, social hx, family hx, and changes were documented in the history of present illness. Otherwise, unchanged from my initial visit note.  Past Medical History:  Diagnosis Date  . Allergic rhinitis   . Anxiety state, unspecified   . Asthma   . Benign neoplasm of pancreas, except islets of Langerhans   . Borderline hypertension   . Hypercholesteremia   . Malignant neoplasm of breast (female), unspecified site   . Osteoporosis, unspecified   . Personal history of chemotherapy   . Personal history of radiation therapy   .  Stenosis of nasolacrimal duct, acquired   . Type II or unspecified type diabetes mellitus without mention of complication, not stated as uncontrolled    Past Surgical History:  Procedure Laterality Date  . BREAST BIOPSY    . BREAST EXCISIONAL BIOPSY    . BREAST LUMPECTOMY     [id number] 1995  . 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 right breast cancer sugery  1995  . surgery on neck for removal of birthmark     Social History   Social History  . Marital status: Married    Spouse  name: N/A  . Number of children: 2   Occupational History  . Bookkeeper   Social History Main Topics  . Smoking status: Never Smoker  . Smokeless tobacco: Never Used  . Alcohol use No  . Drug use: No   Current Outpatient Medications on File Prior to Visit  Medication Sig Dispense Refill  . albuterol (PROVENTIL HFA;VENTOLIN HFA) 108 (90 Base) MCG/ACT inhaler Inhale 2 puffs into the lungs every 6 (six) hours as needed for wheezing or shortness of breath. 1 Inhaler 2  . ALPRAZolam (XANAX) 0.5 MG tablet TAKE ONE TABLET BY MOUTH THREE TIMES DAILY AS NEEDED FOR SLEEP OR FOR ANXIETY 90 tablet 0  . amoxicillin (AMOXIL) 875 MG tablet Take 1 tablet (875 mg total) by mouth 2 (two) times daily. 20 tablet 0  . aspirin 81 MG tablet Take 81 mg by mouth daily.      Marland Kitchen atorvastatin (LIPITOR) 10 MG tablet TAKE 1 TABLET DAILY 90 tablet 1  . BAYER MICROLET LANCETS lancets Use 1-2x a DAY  - for Bayer Contour 100 each 12  . budesonide-formoterol (SYMBICORT) 80-4.5 MCG/ACT inhaler Inhale 2 puffs into the lungs 2 (two) times daily. 1 Inhaler 3  . Cholecalciferol (VITAMIN D) 2000 UNITS CAPS Take 1 capsule by mouth daily.      Marland Kitchen doxycycline (VIBRA-TABS) 100 MG tablet Take 1 tablet (100 mg total) by mouth 2 (two) times daily. 20 tablet 0  . glucose blood test strip Use 1-2x a day - Bayer Contour 100 each 12  . Insulin Glargine (BASAGLAR KWIKPEN) 100 UNIT/ML SOPN INJECT 5 UNITS SUBCUTANEOUSLY INTO THE SKIN DAILY BEFORE BREAKFAST 15 pen 0  . Insulin Pen Needle (CAREFINE PEN NEEDLES) 32G X 4 MM MISC Use 1x a day 100 each 3  . JANUVIA 100 MG tablet TAKE 1 TABLET BY MOUTH ONCE DAILY 30 tablet 5  . losartan (COZAAR) 50 MG tablet TAKE 1 TABLET DAILY 90 tablet 1  . metFORMIN (GLUCOPHAGE-XR) 500 MG 24 hr tablet Take 2 tablets (1,000 mg total) by mouth daily with supper. 180 tablet 3  . montelukast (SINGULAIR) 10 MG tablet Take 1 tablet (10 mg total) by mouth at bedtime. 90 tablet 1   No current facility-administered  medications on file prior to visit.    Allergies  Allergen Reactions  . Influenza Vaccines Other (See Comments)    Unknown, allergic to egg whites   FH: - mother - leukemia, father - liver cancer - mother - HTN - DM in nephew.  PE: BP 134/82   Pulse 82   Ht 5\' 8"  (1.727 m)   Wt 148 lb 3.2 oz (67.2 kg)   SpO2 98%   BMI 22.53 kg/m  Wt Readings from Last 3 Encounters:  12/01/17 148 lb 3.2 oz (67.2 kg)  10/13/17 145 lb 6 oz (65.9 kg)  09/09/17 146 lb 8 oz (66.5 kg)  Constitutional: normal weight, in NAD Eyes: PERRLA, EOMI, no exophthalmos ENT: moist mucous membranes, no thyromegaly, no cervical lymphadenopathy Cardiovascular: RRR, No MRG Respiratory: CTA B Gastrointestinal: abdomen soft, NT, ND, BS+ Musculoskeletal: no deformities, strength intact in all 4 Skin: moist, warm, no rashes Neurological: no tremor with outstretched hands, DTR normal in all 4   ASSESSMENT: 1. DM, insulin-dependent, uncontrolled, without long term complications, but with hyperglycemia  Component     Latest Ref Rng & Units 12/04/2016  C-Peptide     0.80 - 3.85 ng/mL 0.94  Glucose, Fasting     65 - 99 mg/dL 160 (H)  C-peptide is not very high, but is still in the normal range.   She has a history of distal pancreatic resection (?%) For a benign cyst and she developed diabetes soon after the surgery.  2. HL  PLAN:  1. Patient with long history of diabetes due to distal pancreatectomy for pancreatic cyst.  Her sugars improved after starting low-dose basal insulin.  She actually developed low blood sugars so we had to decrease the dose of Lantus and move it in the morning. - At this visit, we reviewed together her carefully kept log: Sugars were wonderful before she started prednisone, after which they increased and stayed high for approximately 2 weeks after she finished the course.  They are now back to baseline, with slightly higher CBGs in the morning (but usually no more than mid 120s), and  sugars at goal in the rest of the day. - We will not change her regimen at this visit, however, we discussed about adjusting her regimen if she needs to be on steroids again: We will start by increasing Lantus by 2 units and if this does not help, she may need mealtime insulin. - I suggested to:  Patient Instructions  Please continue: - Januvia 100 mg before b'fast - Metformin ER 1000 mg with dinner - Lantus 5 units in a.m. (if you need to have another round of steroids, you can try to increase by 2 units. If sugars still high, you need mealtime insulin).  Please return in 4 months with your sugar log.   - today, HbA1c is 7.3% (higher) - continue checking sugars at different times of the day - check 1x a day, rotating checks - advised for yearly eye exams >> she is UTD - Return to clinic in 4 mo with sugar log    2. HL -Reviewed lipid panel from 06/2017: All lipid fractions normal -She continues on Lipitor without side effects -Has arcus cornealis  Philemon Kingdom, MD PhD [facility] Endocrinology

## 2017-12-01 NOTE — Patient Instructions (Addendum)
Please continue: - Januvia 100 mg before b'fast - Metformin ER 1000 mg with dinner - Lantus 5 units in a.m. (if you need to have another round of steroids, you can try to increase by 2 units. If sugars still high, you need mealtime insulin).  Please return in 4 months with your sugar log.

## 2017-12-21 ENCOUNTER — Other Ambulatory Visit: Payer: Self-pay | Admitting: Family Medicine

## 2017-12-21 ENCOUNTER — Encounter: Payer: Self-pay | Admitting: Family Medicine

## 2017-12-22 MED ORDER — ALPRAZOLAM 0.5 MG PO TABS: ORAL_TABLET | ORAL | 0 refills | Status: DC

## 2017-12-22 MED ORDER — ATORVASTATIN CALCIUM 10 MG PO TABS: 10.0000 mg | ORAL_TABLET | Freq: Every day | ORAL | 1 refills | Status: DC

## 2017-12-22 NOTE — Telephone Encounter (Signed)
Last OV 10/13/17 Bacterial Sinusitis, No future appointment scheduled  Last filled 09/15/2017, # 77 with 0 refills

## 2018-03-21 ENCOUNTER — Encounter: Payer: Self-pay | Admitting: Family Medicine

## 2018-03-22 MED ORDER — ALPRAZOLAM 0.5 MG PO TABS: ORAL_TABLET | ORAL | 0 refills | Status: DC

## 2018-03-22 MED ORDER — LOSARTAN POTASSIUM 50 MG PO TABS: 50.0000 mg | ORAL_TABLET | Freq: Every day | ORAL | 1 refills | Status: DC

## 2018-03-22 MED ORDER — MONTELUKAST SODIUM 10 MG PO TABS: 10.0000 mg | ORAL_TABLET | Freq: Every day | ORAL | 1 refills | Status: DC

## 2018-03-22 NOTE — Telephone Encounter (Signed)
Last OV 10/13/17 Alprazolam last filled 12/22/17 #90 with 0

## 2018-03-25 IMAGING — MG 2D DIGITAL SCREENING BILATERAL MAMMOGRAM WITH CAD AND ADJUNCT TO
9 of 13 series · 9 of 29 positions shown · non-contrast
Comparison: Previous exam(s).

CLINICAL DATA: Screening.

EXAM:
2D DIGITAL SCREENING BILATERAL MAMMOGRAM WITH CAD AND ADJUNCT TOMO

[L MLO (1 of 2)]
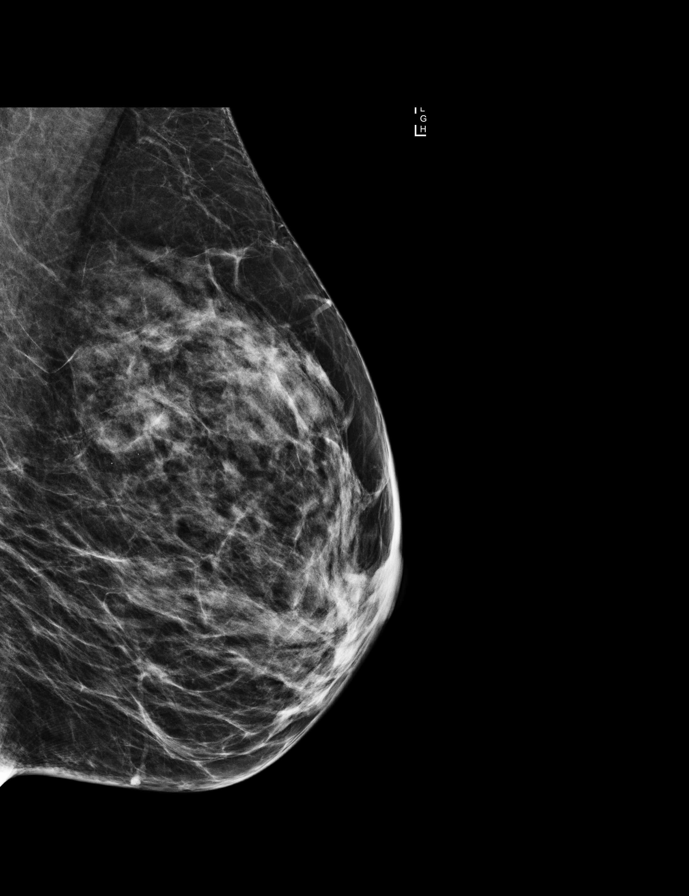

[L CC]
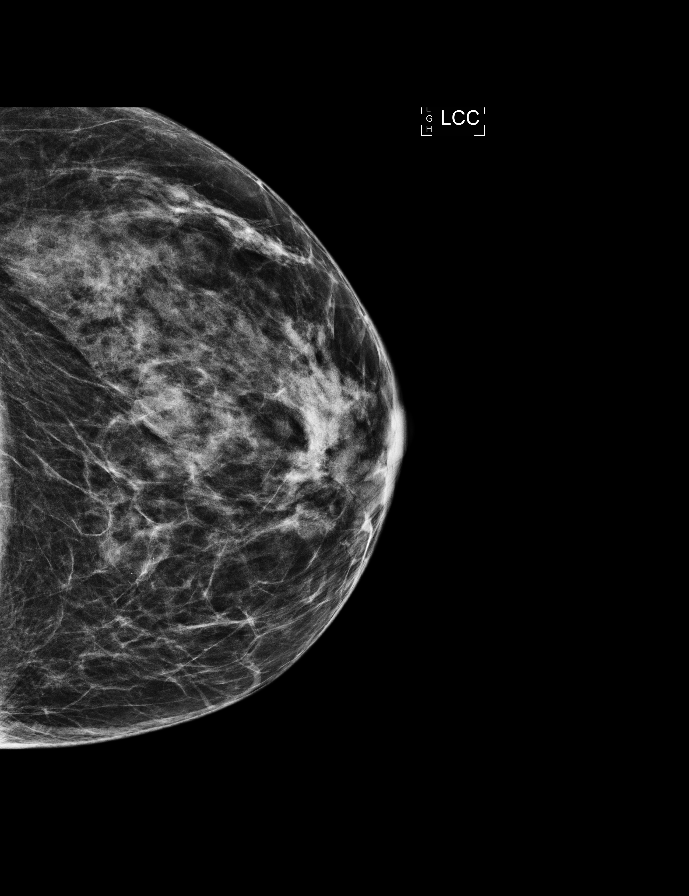

[L MLO synth-2D]
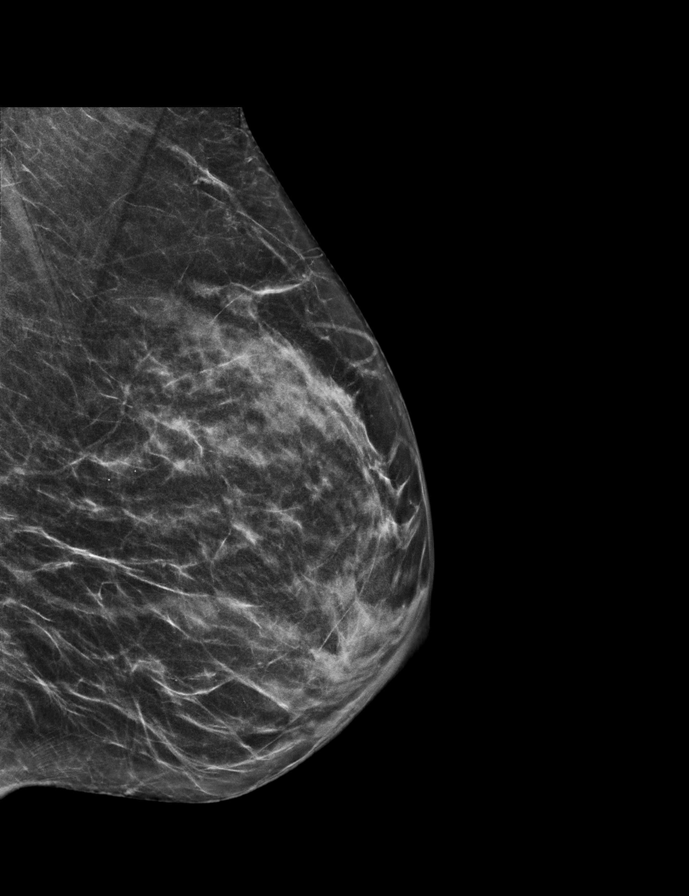

[R MLO synth-2D]
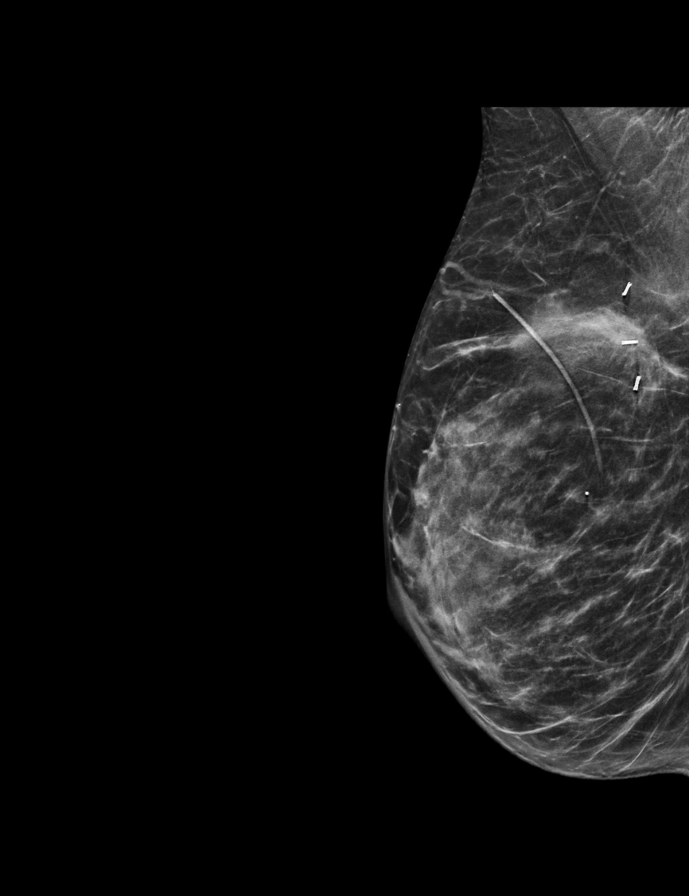

[L CC synth-2D]
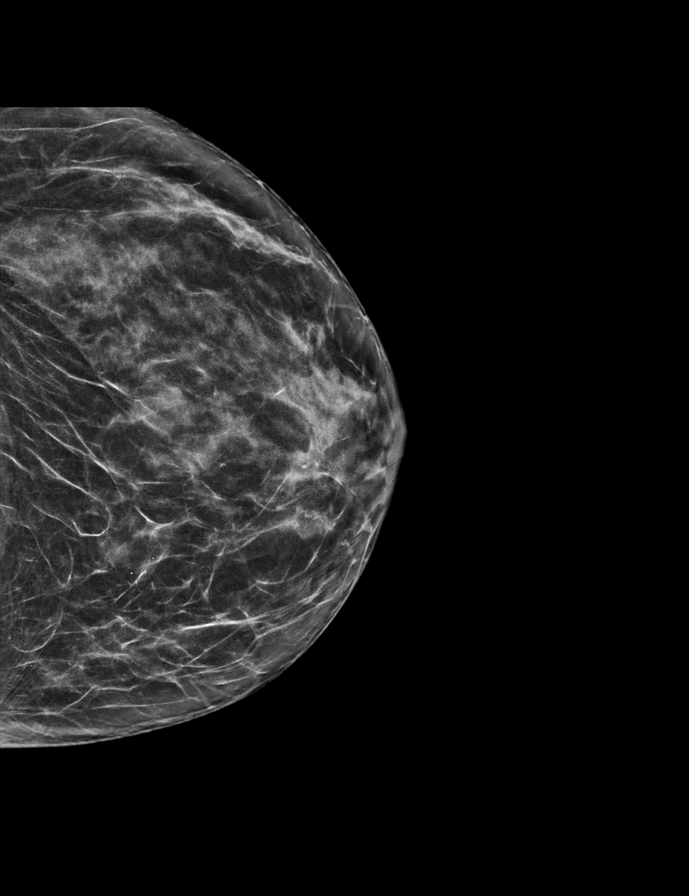

[R MLO]
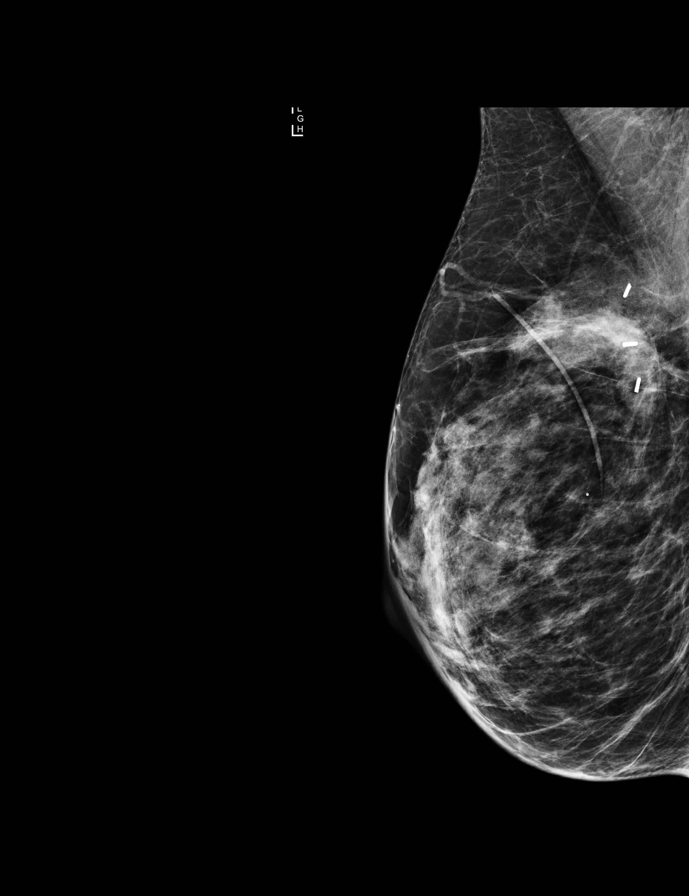

[R CC synth-2D]
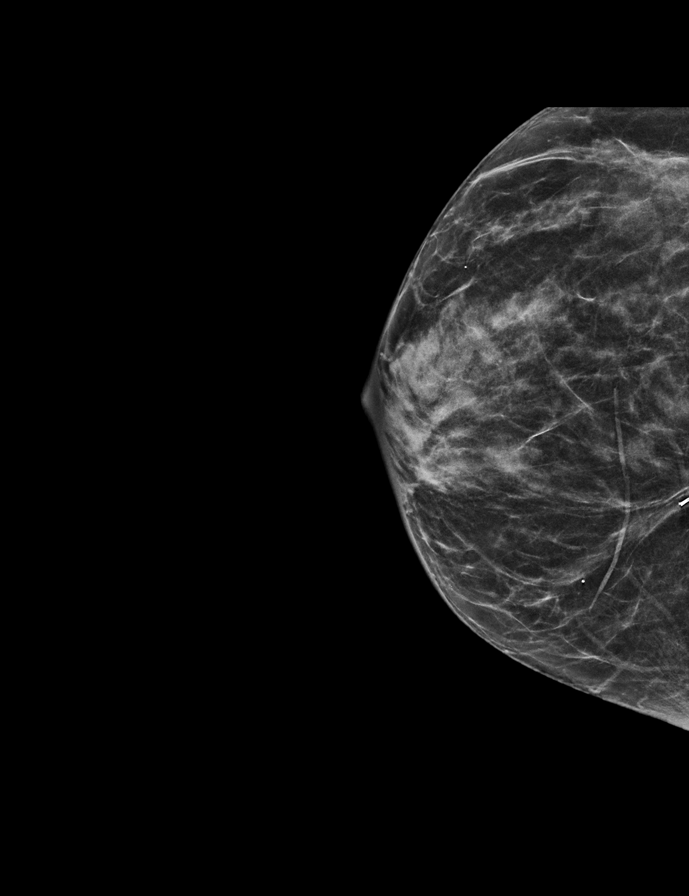

[R CC]
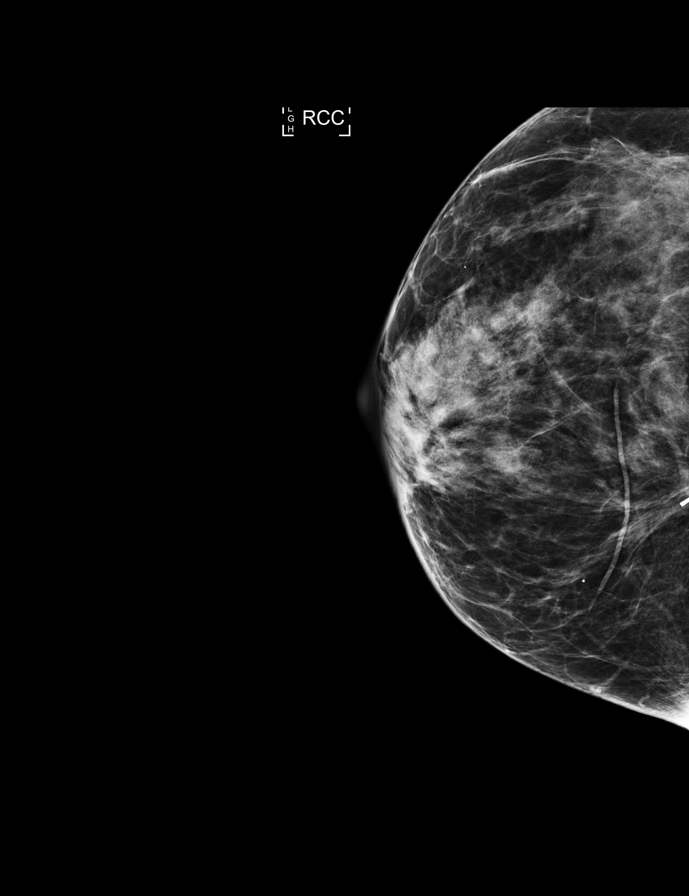

[L MLO (2 of 2)]
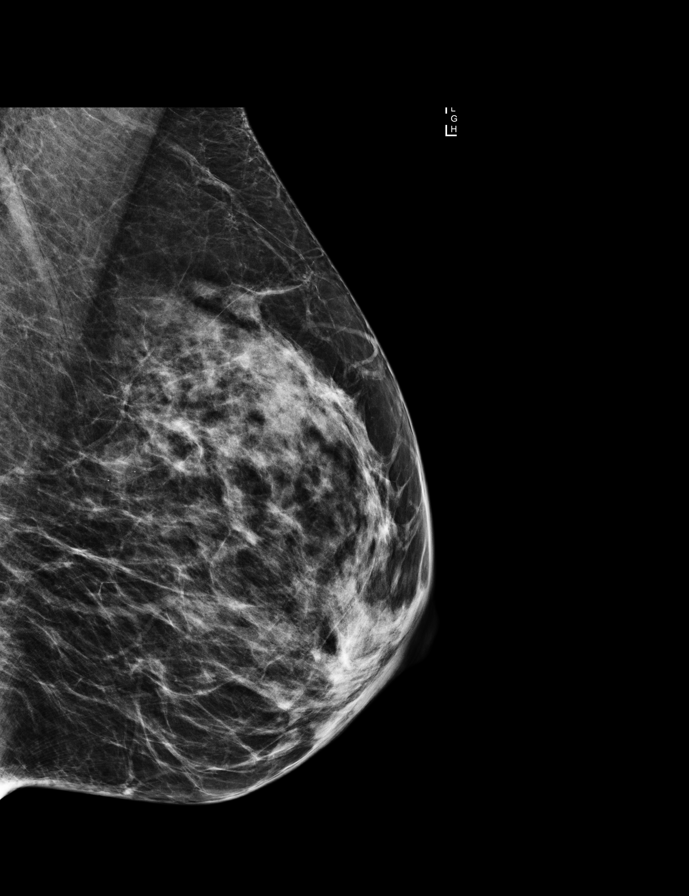

[9 of 29 positions shown; findings below may reference images not displayed]

ACR Breast Density Category c: The breast tissue is heterogeneously
dense, which may obscure small masses.
FINDINGS: There are no findings suspicious for malignancy. Images were
processed with CAD.
IMPRESSION: No mammographic evidence of malignancy. A result letter of this
screening mammogram will be mailed directly to the patient.

RECOMMENDATION:
Screening mammogram in one year. (Code:TN-[id number])

BI-RADS CATEGORY  1: Negative.

## 2018-04-06 NOTE — Progress Notes (Signed)
Patient ID: Amber Washington, female   DOB: [birth year], 62 y.o.   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  Last visit 4 months ago.  She had turbinate surgery in June. She was not allowed to exersize for last mo >> will restart.  Last hemoglobin A1c was: Lab Results  Component Value Date   HGBA1C 7.3 12/01/2017   HGBA1C 6.6 07/29/2017   HGBA1C 6.5 04/28/2017   She is now on: - Januvia 100 mg before breakfast - Metformin ER 1000 mg with dinner - Lantus >> Basaglar 5 units daily at bedtime >> in a.m. >> increased to 6 units in 12/2017 She was on Jardiance 25 mg before b'fast >> yeast inf's >> stopped.  Pt checks sugars 1-2 times a day: - am: 88, 93-126, 143 >> 80, 106-126, Prednisone: 156-170 >> 106-139, 155 - 2h after b'fast: n/c >> 130 >> 126 >> 104 >> n/c - before lunch: 83-121 >> 98-103, 182 >> 109 >> 107-119 - 2h after lunch: n/c >> 216 >> 228 >> n/c >> 80 (after exercise), 101  - before dinner: 78, 90-118, 167, 271 >> 94-118, Prednisone: 136-170 >> 64, 81-143, 152 - 2h after dinner: 179-247 >> 182 >> n/c >> 165, 194 - bedtime: 124-186, 228 >> 100-161, 233 >> n/c >> 129 - nighttime:  got glu tablets >> n/c Lowest: 64 after exercise  Glucometer: Bayer  Pt's meals are: - Breakfast: oatmeal + yoghurt + crackers - Lunch: salads + meat/cheese/crackers + fruit - Dinner: chicken + veggies or salad - Snacks: nuts , crackers, carrots - 1-x a day Drinks water only.  She exercises: Walking, lifting weights  -  no CKD, last BUN/creatinine:  Lab Results  Component Value Date   BUN 19 06/29/2017   BUN 16 10/27/2016   CREATININE 0.66 06/29/2017   CREATININE 0.86 10/27/2016   No MAU: Lab Results  Component Value Date   MICRALBCREAT 3.4 06/28/2012   MICRALBCREAT 2.3 01/27/2011   MICRALBCREAT 3.2  02/05/2010  On   losartan.  - +HL; last set of lipids: Lab Results  Component Value Date   CHOL 124 06/29/2017   HDL 47.10 06/29/2017   LDLCALC 48 06/29/2017   TRIG 144.0 06/29/2017   CHOLHDL 3 06/29/2017  On Lipitor.  - last eye exam was in 05/2017: No DR  - no numbness and tingling in her feet.  She also has HTN.  ROS: Constitutional: no weight gain/no weight loss, no fatigue, no subjective hyperthermia, no subjective hypothermia, + insomnia Eyes: no blurry vision, no xerophthalmia ENT: no sore throat, no nodules palpated in throat, no dysphagia, no odynophagia, no hoarseness Cardiovascular: no CP/no SOB/no palpitations/no leg swelling Respiratory: no cough/no SOB/no wheezing Gastrointestinal: no N/no V/no D/no C/no acid reflux Musculoskeletal: no muscle aches/no joint aches Skin: no rashes, no hair loss Neurological: no tremors/no numbness/no tingling/no dizziness  I reviewed pt's medications, allergies, PMH, social hx, family hx, and changes were documented in the history of present illness. Otherwise, unchanged from my initial visit note.  Past Medical History:  Diagnosis Date  . Allergic rhinitis   . Anxiety state, unspecified   . Asthma   . Benign neoplasm of pancreas, except islets of Langerhans   . Borderline hypertension   . Hypercholesteremia   . Malignant neoplasm of breast (female), unspecified site   . Osteoporosis, unspecified   .  Personal history of chemotherapy   . Personal history of radiation therapy   . Stenosis of nasolacrimal duct, acquired   . Type II or unspecified type diabetes mellitus without mention of complication, not stated as uncontrolled    Past Surgical History:  Procedure Laterality Date  . BREAST BIOPSY    . BREAST EXCISIONAL BIOPSY    . BREAST LUMPECTOMY     [id number] 1995  . 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 right breast cancer sugery  1995  . surgery on neck for removal of  birthmark     Social History   Social History  . Marital status: Married    Spouse name: N/A  . Number of children: 2   Occupational History  . Bookkeeper   Social History Main Topics  . Smoking status: Never Smoker  . Smokeless tobacco: Never Used  . Alcohol use No  . Drug use: No   Current Outpatient Medications on File Prior to Visit  Medication Sig Dispense Refill  . albuterol (PROVENTIL HFA;VENTOLIN HFA) 108 (90 Base) MCG/ACT inhaler Inhale 2 puffs into the lungs every 6 (six) hours as needed for wheezing or shortness of breath. 1 Inhaler 2  . ALPRAZolam (XANAX) 0.5 MG tablet TAKE ONE TABLET BY MOUTH THREE TIMES DAILY AS NEEDED FOR SLEEP OR FOR ANXIETY 90 tablet 0  . aspirin 81 MG tablet Take 81 mg by mouth daily.      Marland Kitchen atorvastatin (LIPITOR) 10 MG tablet Take 1 tablet (10 mg total) by mouth daily. 90 tablet 1  . BAYER MICROLET LANCETS lancets Use 1-2x a DAY  - for Bayer Contour 100 each 12  . budesonide-formoterol (SYMBICORT) 80-4.5 MCG/ACT inhaler Inhale 2 puffs into the lungs 2 (two) times daily. 1 Inhaler 3  . Cholecalciferol (VITAMIN D) 2000 UNITS CAPS Take 1 capsule by mouth daily.      Marland Kitchen doxycycline (VIBRA-TABS) 100 MG tablet Take 1 tablet (100 mg total) by mouth 2 (two) times daily. 20 tablet 0  . glucose blood test strip Use 1-2x a day - Bayer Contour 100 each 12  . Insulin Glargine (BASAGLAR KWIKPEN) 100 UNIT/ML SOPN INJECT 5 UNITS SUBCUTANEOUSLY INTO THE SKIN DAILY BEFORE BREAKFAST 15 pen 0  . Insulin Pen Needle (CAREFINE PEN NEEDLES) 32G X 4 MM MISC Use 1x a day 100 each 3  . losartan (COZAAR) 50 MG tablet Take 1 tablet (50 mg total) by mouth daily. 90 tablet 1  . metFORMIN (GLUCOPHAGE-XR) 500 MG 24 hr tablet Take 2 tablets (1,000 mg total) by mouth daily with supper. 180 tablet 3  . montelukast (SINGULAIR) 10 MG tablet Take 1 tablet (10 mg total) by mouth at bedtime. 90 tablet 1  . sitaGLIPtin (JANUVIA) 100 MG tablet Take 1 tablet (100 mg total) by mouth daily. 30  tablet 11   No current facility-administered medications on file prior to visit.    Allergies  Allergen Reactions  . Influenza Vaccines Other (See Comments)    Unknown, allergic to egg whites   FH: - mother - leukemia, father - liver cancer - mother - HTN - DM in nephew.  PE: There were no vitals taken for this visit. Wt Readings from Last 3 Encounters:  12/01/17 148 lb 3.2 oz (67.2 kg)  10/13/17 145 lb 6 oz (65.9 kg)  09/09/17 146 lb 8 oz (66.5 kg)   Constitutional: normal weight, in NAD Eyes: PERRLA, EOMI, no exophthalmos, + arcus cornealis. ENT: moist mucous membranes,  no thyromegaly, no cervical lymphadenopathy Cardiovascular: RRR, No MRG Respiratory: CTA B Gastrointestinal: abdomen soft, NT, ND, BS+ Musculoskeletal: no deformities, strength intact in all 4 Skin: moist, warm, no rashes Neurological: no tremor with outstretched hands, DTR normal in all 4  ASSESSMENT: 1. DM, insulin-dependent, uncontrolled, without long term complications, but with hyperglycemia  Component     Latest Ref Rng & Units 12/04/2016  C-Peptide     0.80 - 3.85 ng/mL 0.94  Glucose, Fasting     65 - 99 mg/dL 160 (H)  C-peptide is not very high, but is still in the normal range.   She has a history of distal pancreatic resection (?%) For a benign cyst and she developed diabetes soon after the surgery.  2. HL  3.  Insomnia  PLAN:  1. Patient with history of diabetes due to distal pancreatectomy for pancreatic cyst.  Her sugars improved after starting low-dose basal insulin.  Her sugars actually decreased to much so we had to decrease the dose of Lantus and move it in the morning.  We again reviewed her sugars in the carefully kept log.  Sugars are now at goal, but they were higher before last visit on prednisone.  We did not change her regimen at that time but did advise her to increase Lantus by 2 units if she had to have steroids again, but if not enough, I advised her that she may need  mealtime insulin. - At this visit, sugars  are mostly at goal, with occasional spikes in the 140s and 150s and one hyperglycemic spike after dinner in the 190s.  She increase her Basaglar dose slightly 3 months ago and will continue with this dose now.  We will also continue Januvia and metformin, which she tolerates well, without side effects. - I suggested to:  Patient Instructions  Please continue: - Januvia 100 mg before b'fast - Metformin ER 1000 mg with dinner - Basaglar 6 units in a.m.   Please return in 4 months with your sugar log.   - today, HbA1c is 6.9% (lower) - continue checking sugars at different times of the day - check 1x a day, rotating checks - advised for yearly eye exams >> she is UTD - Return to clinic in 4 mo with sugar log    2. HL - Reviewed latest lipid panel from 06/2017: All lipid fractions normal Lab Results  Component Value Date   CHOL 124 06/29/2017   HDL 47.10 06/29/2017   LDLCALC 48 06/29/2017   TRIG 144.0 06/29/2017   CHOLHDL 3 06/29/2017  - Continues Lipitor without side effects. - Has arcus cornealis  3.  Insomnia -She is bothered by waking up at night and cannot fall back to sleep -She is taking Xanax but does not help with her terminal insomnia, only helps her fall asleep -Discussed that this is most likely hormonal, related to menopause -Discussed different options for management, to include melatonin, Benadryl, CBD oil.  I advised her that I cannot recommend any of these medically.  Philemon Kingdom, MD PhD [facility] Endocrinology

## 2018-04-07 ENCOUNTER — Ambulatory Visit (INDEPENDENT_AMBULATORY_CARE_PROVIDER_SITE_OTHER): Payer: BC Managed Care – PPO | Admitting: Internal Medicine

## 2018-04-07 ENCOUNTER — Encounter: Payer: Self-pay | Admitting: Internal Medicine

## 2018-04-07 VITALS — BP 130/76 | HR 77 | Ht 68.0 in | Wt 152.0 lb

## 2018-04-07 DIAGNOSIS — G4700 Insomnia, unspecified: Secondary | ICD-10-CM

## 2018-04-07 DIAGNOSIS — E7800 Pure hypercholesterolemia, unspecified: Secondary | ICD-10-CM | POA: Diagnosis not present

## 2018-04-07 DIAGNOSIS — Z794 Long term (current) use of insulin: Secondary | ICD-10-CM

## 2018-04-07 DIAGNOSIS — E1165 Type 2 diabetes mellitus with hyperglycemia: Secondary | ICD-10-CM

## 2018-04-07 LAB — POCT GLYCOSYLATED HEMOGLOBIN (HGB A1C): Hemoglobin A1C: 6.9 % — AB (ref 4.0–5.6)

## 2018-04-07 MED ORDER — BASAGLAR KWIKPEN 100 UNIT/ML ~~LOC~~ SOPN: PEN_INJECTOR | SUBCUTANEOUS | 5 refills | Status: DC

## 2018-04-07 NOTE — Addendum Note (Signed)
Addended by: Drucilla Schmidt on: 04/07/2018 12:18 PM   Modules accepted: Orders

## 2018-04-07 NOTE — Patient Instructions (Signed)
Please continue: - Januvia 100 mg before b'fast - Metformin ER 1000 mg with dinner - Basaglar 6 units in a.m.  Please return in 4 months with your sugar log.  

## 2018-04-26 ENCOUNTER — Other Ambulatory Visit: Payer: Self-pay | Admitting: Internal Medicine

## 2018-05-14 ENCOUNTER — Ambulatory Visit: Payer: Self-pay | Admitting: Family Medicine

## 2018-05-14 ENCOUNTER — Ambulatory Visit: Payer: BC Managed Care – PPO | Admitting: Family Medicine

## 2018-05-14 ENCOUNTER — Encounter: Payer: Self-pay | Admitting: Family Medicine

## 2018-05-14 ENCOUNTER — Other Ambulatory Visit: Payer: Self-pay

## 2018-05-14 VITALS — BP 136/86 | HR 81 | Temp 98.6°F | Resp 16 | Ht 68.0 in | Wt 151.4 lb

## 2018-05-14 DIAGNOSIS — H8110 Benign paroxysmal vertigo, unspecified ear: Secondary | ICD-10-CM | POA: Diagnosis not present

## 2018-05-14 DIAGNOSIS — I10 Essential (primary) hypertension: Secondary | ICD-10-CM

## 2018-05-14 MED ORDER — MECLIZINE HCL 25 MG PO TABS: 25.0000 mg | ORAL_TABLET | Freq: Three times a day (TID) | ORAL | 0 refills | Status: DC | PRN

## 2018-05-14 NOTE — Patient Instructions (Signed)
Follow up in 3-4 weeks to recheck BP Continue the Losartan once daily Drink plenty of fluids Take the Meclizine as needed for vertigo Change positions slowly Call with any questions or concerns Hang in there!!!

## 2018-05-14 NOTE — Telephone Encounter (Signed)
Patient reports she had massage yesterday and afterward she had dizziness. Patient has had fluctuating BP and burning and tingling in her fingertip of fifth digit of right hand.Patient states she is hydrating- she is on BP medication and does not normally monitor her BP. Her readings have fluctuated during the last 24 hours from- 149/78-162/86-to 179/89 this morning.  Reason for Disposition . Systolic BP  >= 592 OR Diastolic >= 924  Answer Assessment - Initial Assessment Questions 1. BLOOD PRESSURE: "What is the blood pressure?" "Did you take at least two measurements 5 minutes apart?"     178/89  179/90   P 77 2. ONSET: "When did you take your blood pressure?"     8:00 3. HOW: "How did you obtain the blood pressure?" (e.g., visiting nurse, automatic home BP monitor)     Automatic BP 4. HISTORY: "Do you have a history of high blood pressure?"     yes 5. MEDICATIONS: "Are you taking any medications for blood pressure?" "Have you missed any doses recently?"     Yes- no missed doses 6. OTHER SYMPTOMS: "Do you have any symptoms?" (e.g., headache, chest pain, blurred vision, difficulty breathing, weakness)     Headache- possibly triggered by massage, slight wheeze today 7. PREGNANCY: "Is there any chance you are pregnant?" "When was your last menstrual period?"     n/a  Protocols used: HIGH BLOOD PRESSURE-A-AH

## 2018-05-14 NOTE — Assessment & Plan Note (Signed)
New.  Pt has had this in the past but the episodes were very brief and not as severe.  She developed sxs yesterday after a massage.  Hx and PE consistent w/ BPV.  Start Meclizine prn.  Reviewed supportive care and red flags that should prompt return.  Pt expressed understanding and is in agreement w/ plan.

## 2018-05-14 NOTE — Progress Notes (Signed)
   Subjective:    Patient ID: Amber Washington, female    DOB: [birth year], 62 y.o.   MRN: [id number]  HPI HTN- chronic problem.  Deteriorated yesterday.  Is on Losartan 50mg  daily w/ hx of good control.  Had a massage yesterday and developed dizziness afterwards.  Pt had vertigo last night when she laid [place].  Last night BP was '146 over something' and then it went up to '166 over 80 something'.  Pt rolled over in bed this AM 'and went swimmy'.  This AM BP was again elevated.   Review of Systems For ROS see HPI     Objective:   Physical Exam  Constitutional: She is oriented to person, place, and time. She appears well-developed and well-nourished. No distress.  HENT:  Head: Normocephalic and atraumatic.  Mouth/Throat: Uvula is midline and mucous membranes are normal.  TMs WNL No TTP over sinuses Minimal nasal congestion  Eyes: Pupils are equal, round, and reactive to light. Conjunctivae and EOM are normal.  2-3 beats of horizontal nystagmus when looking R  Neck: Normal range of motion. Neck supple.  Cardiovascular: Normal rate, regular rhythm, normal heart sounds and intact distal pulses.  Pulmonary/Chest: Effort normal and breath sounds normal. No respiratory distress. She has no wheezes. She has no rales.  Musculoskeletal: She exhibits no edema.  Lymphadenopathy:    She has no cervical adenopathy.  Neurological: She is alert and oriented to person, place, and time. She has normal reflexes. No cranial nerve deficit.  Skin: Skin is warm and dry.  Psychiatric: Her behavior is normal. Judgment and thought content normal.  anxious  Vitals reviewed.         Assessment & Plan:

## 2018-05-14 NOTE — Assessment & Plan Note (Signed)
Deteriorated.  Pt's home BPs have been quite elevated over the last 18 hrs.  + dizziness, shaky feeling.  I suspect some of the BP elevation is due to anxiety regarding her BPV as her BP is closer to normal here in the office.  Also unclear if her home cuff is accurate.  Rather than adjusting BP meds at this time, will tx BPV and monitor closely.  Reviewed supportive care and red flags that should prompt return.  Pt expressed understanding and is in agreement w/ plan.

## 2018-05-14 NOTE — Telephone Encounter (Signed)
Patient is currently in the room now.

## 2018-06-01 ENCOUNTER — Other Ambulatory Visit: Payer: Self-pay | Admitting: Gynecology

## 2018-06-01 DIAGNOSIS — Z1231 Encounter for screening mammogram for malignant neoplasm of breast: Secondary | ICD-10-CM

## 2018-06-10 ENCOUNTER — Ambulatory Visit (INDEPENDENT_AMBULATORY_CARE_PROVIDER_SITE_OTHER): Payer: BC Managed Care – PPO | Admitting: Family Medicine

## 2018-06-10 ENCOUNTER — Encounter: Payer: Self-pay | Admitting: General Practice

## 2018-06-10 ENCOUNTER — Encounter: Payer: Self-pay | Admitting: Family Medicine

## 2018-06-10 ENCOUNTER — Other Ambulatory Visit: Payer: Self-pay

## 2018-06-10 VITALS — BP 123/81 | HR 74 | Temp 98.6°F | Resp 16 | Ht 68.0 in | Wt 150.5 lb

## 2018-06-10 DIAGNOSIS — I10 Essential (primary) hypertension: Secondary | ICD-10-CM

## 2018-06-10 DIAGNOSIS — E7800 Pure hypercholesterolemia, unspecified: Secondary | ICD-10-CM | POA: Diagnosis not present

## 2018-06-10 DIAGNOSIS — R42 Dizziness and giddiness: Secondary | ICD-10-CM | POA: Insufficient documentation

## 2018-06-10 DIAGNOSIS — H8110 Benign paroxysmal vertigo, unspecified ear: Secondary | ICD-10-CM | POA: Diagnosis not present

## 2018-06-10 LAB — BASIC METABOLIC PANEL
BUN: 17 mg/dL (ref 6–23)
CALCIUM: 9.5 mg/dL (ref 8.4–10.5)
CHLORIDE: 99 meq/L (ref 96–112)
CO2: 33 meq/L — AB (ref 19–32)
Creatinine, Ser: 0.74 mg/dL (ref 0.40–1.20)
GFR: 85.11 mL/min (ref >60.00)
Glucose, Bld: 167 mg/dL — ABNORMAL HIGH (ref 70–99)
POTASSIUM: 4.1 meq/L (ref 3.5–5.1)
SODIUM: 138 meq/L (ref 135–145)

## 2018-06-10 LAB — LIPID PANEL
Cholesterol: 139 mg/dL (ref 0–200)
HDL: 51.4 mg/dL (ref >39.00)
LDL Cholesterol: 70 mg/dL (ref 0–99)
NONHDL: 87.85
Total CHOL/HDL Ratio: 3
Triglycerides: 91 mg/dL (ref 0.0–149.0)
VLDL: 18.2 mg/dL (ref 0.0–40.0)

## 2018-06-10 LAB — CBC WITH DIFFERENTIAL/PLATELET
BASOS PCT: 1.1 % (ref 0.0–3.0)
Basophils Absolute: 0.1 10*3/uL (ref 0.0–0.1)
EOS ABS: 0.3 10*3/uL (ref 0.0–0.7)
EOS PCT: 4 % (ref 0.0–5.0)
HCT: 36.6 % (ref 36.0–46.0)
Hemoglobin: 12.1 g/dL (ref 12.0–15.0)
LYMPHS ABS: 2.3 10*3/uL (ref 0.7–4.0)
Lymphocytes Relative: 33.5 % (ref 12.0–46.0)
MCHC: 33 g/dL (ref 30.0–36.0)
MCV: 87.2 fl (ref 78.0–100.0)
MONO ABS: 1 10*3/uL (ref 0.1–1.0)
Monocytes Relative: 14 % — ABNORMAL HIGH (ref 3.0–12.0)
NEUTROS ABS: 3.2 10*3/uL (ref 1.4–7.7)
NEUTROS PCT: 47.4 % (ref 43.0–77.0)
PLATELETS: 328 10*3/uL (ref 150.0–400.0)
RBC: 4.2 Mil/uL (ref 3.87–5.11)
RDW: 13.5 % (ref 11.5–15.5)
WBC: 6.8 10*3/uL (ref 4.0–10.5)

## 2018-06-10 LAB — TSH: TSH: 1.31 u[IU]/mL (ref 0.35–4.50)

## 2018-06-10 LAB — HEPATIC FUNCTION PANEL
ALK PHOS: 71 U/L (ref 39–117)
ALT: 11 U/L (ref 0–35)
AST: 16 U/L (ref 0–37)
Albumin: 4.1 g/dL (ref 3.5–5.2)
BILIRUBIN DIRECT: 0.1 mg/dL (ref 0.0–0.3)
TOTAL PROTEIN: 6.9 g/dL (ref 6.0–8.3)
Total Bilirubin: 0.5 mg/dL (ref 0.2–1.2)

## 2018-06-10 MED ORDER — MECLIZINE HCL 25 MG PO TABS: 25.0000 mg | ORAL_TABLET | Freq: Three times a day (TID) | ORAL | 0 refills | Status: DC | PRN

## 2018-06-10 NOTE — Patient Instructions (Addendum)
Schedule your complete physical in 6 months We'll notify you of your lab results and make any changes if needed Keep up the good work on healthy diet and regular exercise- you look great! Continue to drink plenty of fluids Change positions SLOWLY Do the Epply Maneuver as tolerated Use the Meclizine as needed Call with any questions or concerns Hang in there!!!

## 2018-06-10 NOTE — Assessment & Plan Note (Signed)
Deteriorated.  This is pt's 2nd episode in a month.  Her sxs seemed to completely resolve after the first episode but have returned.  Offered neurology referral but declined at this time and wants to see if sxs resolve.  Knows that this was triggered by her trip to the dentist.  Refill on Meclizine.  Reviewed modified Eppley.  If no improvement, will refer to Neurology.  Pt expressed understanding and is in agreement w/ plan.

## 2018-06-10 NOTE — Assessment & Plan Note (Signed)
Chronic problem.  On Lipitor daily.  Check labs.  Adjust meds prn  

## 2018-06-10 NOTE — Assessment & Plan Note (Signed)
Chronic problem.  Well controlled today.  Asymptomatic.  Check labs.  No anticipated med changes. 

## 2018-06-10 NOTE — Progress Notes (Signed)
   Subjective:    Patient ID: Amber Washington, female    DOB: 11/13/1955, 62 y.o.   MRN: [id number]  HPI HTN- chronic problem, on Losartan 50mg  daily w/ good control.  Denies CP, SOB, HAs, visual changes, edema.  Vertigo- pt reports previous sxs resolved after last visit after doing Modified Eppley maneuver.  Sxs returned on Sunday but feel like a 'constant dizziness'.  Exacerbated on Monday at the dentist.  Saw chiropractor this AM and was 'adjusted' and has 'been spinning since'.  sxs are R sided.  Meclizine will help.  This is 2nd episode for pt.  Has never had complete workup.    Hyperlipidemia- chronic problem.  Tolerating statin w/o difficulty.  Denies abd pain, N/V.   Review of Systems For ROS see HPI     Objective:   Physical Exam  Constitutional: She is oriented to person, place, and time. She appears well-developed and well-nourished. No distress.  HENT:  Head: Normocephalic and atraumatic.  Eyes: Pupils are equal, round, and reactive to light. Conjunctivae and EOM are normal.  Neck: Normal range of motion. Neck supple. No thyromegaly present.  Cardiovascular: Normal rate, regular rhythm, normal heart sounds and intact distal pulses.  No murmur heard. Pulmonary/Chest: Effort normal and breath sounds normal. No respiratory distress.  Abdominal: Soft. She exhibits no distension. There is no tenderness.  Musculoskeletal: She exhibits no edema.  Lymphadenopathy:    She has no cervical adenopathy.  Neurological: She is alert and oriented to person, place, and time.  Very dizzy with movement or turning her head  Skin: Skin is warm and dry.  Psychiatric: She has a normal mood and affect. Her behavior is normal.  Vitals reviewed.         Assessment & Plan:

## 2018-06-11 ENCOUNTER — Other Ambulatory Visit: Payer: Self-pay | Admitting: Family Medicine

## 2018-06-16 ENCOUNTER — Other Ambulatory Visit: Payer: Self-pay | Admitting: Family Medicine

## 2018-06-16 MED ORDER — ALPRAZOLAM 0.5 MG PO TABS: ORAL_TABLET | ORAL | 0 refills | Status: DC

## 2018-06-16 NOTE — Telephone Encounter (Signed)
Last Filled: 03/22/18 #90,0 Last OV: 06/10/18

## 2018-06-29 ENCOUNTER — Ambulatory Visit
Admission: RE | Admit: 2018-06-29 | Discharge: 2018-06-29 | Disposition: A | Discharge status: Home / Self Care | Payer: BC Managed Care – PPO | Source: Ambulatory Visit | Attending: Gynecology | Admitting: Gynecology

## 2018-06-29 DIAGNOSIS — Z1231 Encounter for screening mammogram for malignant neoplasm of breast: Secondary | ICD-10-CM

## 2018-07-22 LAB — HM DIABETES EYE EXAM

## 2018-08-04 ENCOUNTER — Encounter: Payer: Self-pay | Admitting: General Practice

## 2018-08-18 ENCOUNTER — Encounter: Payer: Self-pay | Admitting: Internal Medicine

## 2018-08-18 ENCOUNTER — Ambulatory Visit (INDEPENDENT_AMBULATORY_CARE_PROVIDER_SITE_OTHER): Payer: BC Managed Care – PPO | Admitting: Internal Medicine

## 2018-08-18 VITALS — BP 128/80 | HR 71 | Ht 68.0 in | Wt 154.0 lb

## 2018-08-18 DIAGNOSIS — E1165 Type 2 diabetes mellitus with hyperglycemia: Secondary | ICD-10-CM

## 2018-08-18 DIAGNOSIS — E7800 Pure hypercholesterolemia, unspecified: Secondary | ICD-10-CM

## 2018-08-18 DIAGNOSIS — Z794 Long term (current) use of insulin: Secondary | ICD-10-CM

## 2018-08-18 LAB — POCT GLYCOSYLATED HEMOGLOBIN (HGB A1C): Hemoglobin A1C: 6.4 % — AB (ref 4.0–5.6)

## 2018-08-18 NOTE — Patient Instructions (Signed)
Please continue: - Januvia 100 mg before b'fast - Metformin ER 1000 mg with dinner - Basaglar 6 units in a.m.  Please return in 4 months with your sugar log.

## 2018-08-18 NOTE — Progress Notes (Signed)
Patient ID: Amber Washington, female   DOB: 1955-09-13, 62 y.o.   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  Last visit 4.5 months ago.  She recently had vertigo for 3 weeks in 05/2018.  This is now resolved.  Latest HbA1c: Lab Results  Component Value Date   HGBA1C 6.9 (A) 04/07/2018   HGBA1C 7.3 12/01/2017   HGBA1C 6.6 07/29/2017   She is now on: - Januvia 100 mg before breakfast - Metformin ER 1000 mg with dinner - Lantus >> Basaglar 5 units daily at bedtime >> in a.m. >> increased to 6 units  05/12/2018 She was on Jardiance 25 mg before b'fast >> yeast inf's >> stopped.  Pt checks sugars 1-2 times a day: - am: 80, 106-126, Prednisone: 156-170 >> 106-139, 155 >> 99-141, 156, 167 - 2h after b'fast: n/c >> 130 >> 126 >> 104 >> n/c - before lunch: 83-121 >> 98-103, 182 >> 109 >> 107-119 >> 87, 163 - 2h after lunch: n/c >> 216 >> 228 >> n/c >> 80 , 101 >> n/c - before dinner: 94-118, Prednisone: 136-170 >> 64, 81-143, 152 >> 83-154, 179 - 2h after dinner: 179-247 >> 182 >> n/c >> 165, 194 >> n/c - bedtime: 124-186, 228 >> 100-161, 233 >> n/c >> 129 >> n/c - nighttime:  got glu tablets >> n/c Lowest: 64 after exercise  Glucometer: Bayer Contour  Pt's meals are: - Breakfast: oatmeal + yoghurt + crackers - Lunch: salads + meat/cheese/crackers + fruit - Dinner: chicken + veggies or salad - Snacks: nuts , crackers, carrots - 1-x a day Drinks water only.  She continues to exercise by walking, lifting weights.  -No CKD, last BUN/creatinine:  Lab Results  Component Value Date   BUN 17 06/10/2018   BUN 19 06/29/2017   CREATININE 0.74 06/10/2018   CREATININE 0.66 06/29/2017   ACR normal: Lab Results  Component Value Date   MICRALBCREAT 3.4 06/28/2012   MICRALBCREAT 2.3 01/27/2011   MICRALBCREAT 3.2  02/05/2010  On losartan.  -+ HL; last set of lipids: Lab Results  Component Value Date   CHOL 139 06/10/2018   HDL 51.40 06/10/2018   LDLCALC 70 06/10/2018   TRIG 91.0 06/10/2018   CHOLHDL 3 06/10/2018  On Lipitor  - last eye exam was in 07/22/2018: No DR  -No numbness and tingling in her feet.  She also has HTN.  ROS: Constitutional: no weight gain/no weight loss, no fatigue, no subjective hyperthermia, no subjective hypothermia Eyes: no blurry vision, no xerophthalmia ENT: no sore throat, no nodules palpated in neck, no dysphagia, no odynophagia, no hoarseness Cardiovascular: no CP/no SOB/no palpitations/no leg swelling Respiratory: no cough/no SOB/no wheezing Gastrointestinal: no N/no V/no D/no C/no acid reflux Musculoskeletal: no muscle aches/no joint aches Skin: no rashes, no hair loss Neurological: no tremors/no numbness/no tingling/+ dizziness, now resolved  I reviewed pt's medications, allergies, PMH, social hx, family hx, and changes were documented in the history of present illness. Otherwise, unchanged from my initial visit note.  Past Medical History:  Diagnosis Date  . Allergic rhinitis   . Anxiety state, unspecified   . Asthma   . Benign neoplasm of pancreas, except islets of Langerhans   . Borderline hypertension   . Hypercholesteremia   . Malignant neoplasm of breast (female), unspecified site   . Osteoporosis, unspecified   .  Personal history of chemotherapy   . Personal history of radiation therapy   . Stenosis of nasolacrimal duct, acquired   . Type II or unspecified type diabetes mellitus without mention of complication, not stated as uncontrolled    Past Surgical History:  Procedure Laterality Date  . BREAST BIOPSY    . BREAST EXCISIONAL BIOPSY    . BREAST LUMPECTOMY     [id number] 1995  . 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 NASAL SEPTUM SURGERY    . right breast cancer sugery  1995  . surgery on  neck for removal of birthmark    . tur induction     Social History   Social History  . Marital status: Married    Spouse name: N/A  . Number of children: 2   Occupational History  . Bookkeeper   Social History Main Topics  . Smoking status: Never Smoker  . Smokeless tobacco: Never Used  . Alcohol use No  . Drug use: No   Current Outpatient Medications on File Prior to Visit  Medication Sig Dispense Refill  . albuterol (PROVENTIL HFA;VENTOLIN HFA) 108 (90 Base) MCG/ACT inhaler Inhale 2 puffs into the lungs every 6 (six) hours as needed for wheezing or shortness of breath. 1 Inhaler 2  . ALPRAZolam (XANAX) 0.5 MG tablet TAKE ONE TABLET BY MOUTH THREE TIMES DAILY AS NEEDED FOR SLEEP OR FOR ANXIETY 90 tablet 0  . aspirin 81 MG tablet Take 81 mg by mouth daily.      Marland Kitchen atorvastatin (LIPITOR) 10 MG tablet TAKE 1 TABLET DAILY 90 tablet 1  . BAYER MICROLET LANCETS lancets Use 1-2x a DAY  - for Bayer Contour 100 each 12  . budesonide-formoterol (SYMBICORT) 80-4.5 MCG/ACT inhaler Inhale 2 puffs into the lungs 2 (two) times daily. 1 Inhaler 3  . Cholecalciferol (VITAMIN D) 2000 UNITS CAPS Take 1 capsule by mouth daily.      . Clocortolone Pivalate (CLODERM) 0.1 % cream Cloderm 0.1 % topical cream    . fluocinonide-emollient (LIDEX-E) 0.05 % cream Fluocinonide-E 0.05 % topical cream    . glucose blood test strip Use 1-2x a day - Bayer Contour 100 each 12  . glucose blood test strip OneTouch Ultra Test strips    . Insulin Glargine (BASAGLAR KWIKPEN) 100 UNIT/ML SOPN INJECT 6 UNITS SUBCUTANEOUSLY INTO THE SKIN DAILY BEFORE BREAKFAST 5 pen 5  . Insulin Pen Needle (CAREFINE PEN NEEDLES) 32G X 4 MM MISC Use 1x a day 100 each 3  . losartan (COZAAR) 50 MG tablet Take 1 tablet (50 mg total) by mouth daily. 90 tablet 1  . meclizine (ANTIVERT) 25 MG tablet Take 1 tablet (25 mg total) by mouth 3 (three) times daily as needed for dizziness. 45 tablet 0  . metFORMIN (GLUCOPHAGE-XR) 500 MG 24 hr tablet  Take 2 tablets (1,000 mg total) by mouth daily with supper. 180 tablet 3  . montelukast (SINGULAIR) 10 MG tablet Take 1 tablet (10 mg total) by mouth at bedtime. 90 tablet 1  . ONETOUCH DELICA LANCETS 54Y MISC OneTouch Delica Lancets 33 gauge    . sitaGLIPtin (JANUVIA) 100 MG tablet Take 1 tablet (100 mg total) by mouth daily. 30 tablet 11   No current facility-administered medications on file prior to visit.    Allergies  Allergen Reactions  . Influenza Vaccines Other (See Comments)    Unknown, allergic to egg whites  . Eggs Or Egg-Derived Products    FH: -  mother - leukemia, father - liver cancer - mother - HTN - DM in nephew.  PE: BP 128/80   Pulse 71   Ht 5\' 8"  (1.727 m) Comment: measured  Wt 154 lb (69.9 kg)   SpO2 98%   BMI 23.42 kg/m  Wt Readings from Last 3 Encounters:  08/18/18 154 lb (69.9 kg)  06/10/18 150 lb 8 oz (68.3 kg)  05/14/18 151 lb 6.4 oz (68.7 kg)   Constitutional: overweight, in NAD Eyes: PERRLA, EOMI, no exophthalmos, + arcus cornealis ENT: moist mucous membranes, no thyromegaly, no cervical lymphadenopathy Cardiovascular: RRR, No MRG Respiratory: CTA B Gastrointestinal: abdomen soft, NT, ND, BS+ Musculoskeletal: no deformities, strength intact in all 4 Skin: moist, warm, no rashes Neurological: no tremor with outstretched hands, DTR normal in all 4  ASSESSMENT: 1. DM, insulin-dependent, uncontrolled, without long term complications, but with hyperglycemia  Component     Latest Ref Rng & Units 12/04/2016  C-Peptide     0.80 - 3.85 ng/mL 0.94  Glucose, Fasting     65 - 99 mg/dL 160 (H)  C-peptide is not very high, but is still in the normal range.   She has a history of distal pancreatic resection (?%) For a benign cyst and she developed diabetes soon after the surgery.  2. HL  3.  Vertigo  PLAN:  1. Patient with history of diabetes due to distal pancreatectomy for pancreatic cyst.  Her sugars improved after starting low-dose basal  insulin.  She is now on a minimal dose of 6 units daily.  We also moved Lantus in the morning to avoid hypoglycemia.  She usually brings a carefully kept log and her sugars are now at goal.  They were higher in the past on prednisone.  We discussed about increasing Lantus by 2 units if she had to have steroids again.  She tolerates metformin and Januvia well, without side effects. -At last visit, HbA1c was improved, to 6.9% -At this visit, we reviewed together her carefully kept log.  Her sugars are almost all at goal, with occasional spikes in the 150s or 160s.  However, these are not frequent.  We discussed about starting to check some sugars at bedtime, since the majority of the spikes are in the morning.  Whenever they happen before a meal, the sugars are checked after a snack. -For now, I do not feel that we need to change her regimen. - I suggested to:  Patient Instructions  Please continue: - Januvia 100 mg before b'fast - Metformin ER 1000 mg with dinner - Basaglar 6 units in a.m.  Please return in 4 months with your sugar log.   - today, HbA1c is 6.4% (improved) - continue checking sugars at different times of the day - check 1x a day, rotating checks - advised for yearly eye exams >> she is UTD - UTD with flu shot - Return to clinic in 4 mo with sugar log     2. HL - Reviewed latest lipid panel from 06/2018: all fxns at goal Lab Results  Component Value Date   CHOL 139 06/10/2018   HDL 51.40 06/10/2018   LDLCALC 70 06/10/2018   TRIG 91.0 06/10/2018   CHOLHDL 3 06/10/2018  - Continues lipitor without side effects. - + arcus conealis  3.  Vertigo -Possibly related to her previous turbinate surgery -Reviewed her medication list and none of her medicines have this is a side effect -Continues meclizine as needed, but not needed lately  Philemon Kingdom,  MD PhD [facility]

## 2018-08-30 ENCOUNTER — Encounter: Payer: Self-pay | Admitting: Family Medicine

## 2018-08-30 MED ORDER — ALPRAZOLAM 0.5 MG PO TABS: ORAL_TABLET | ORAL | 0 refills | Status: DC

## 2018-08-30 NOTE — Telephone Encounter (Signed)
Last OV 06/10/18 Alprazolam last filled 06/16/18 #90 with 0

## 2018-09-27 ENCOUNTER — Other Ambulatory Visit: Payer: Self-pay | Admitting: Internal Medicine

## 2018-09-29 ENCOUNTER — Other Ambulatory Visit: Payer: Self-pay | Admitting: Family Medicine

## 2018-10-04 ENCOUNTER — Other Ambulatory Visit: Payer: Self-pay

## 2018-10-04 MED ORDER — INSULIN GLARGINE 100 UNIT/ML SOLOSTAR PEN: 5.0000 [IU] | PEN_INJECTOR | Freq: Every day | SUBCUTANEOUS | 3 refills | Status: DC

## 2018-10-06 ENCOUNTER — Other Ambulatory Visit: Payer: Self-pay

## 2018-11-18 ENCOUNTER — Other Ambulatory Visit: Payer: Self-pay

## 2018-11-18 ENCOUNTER — Encounter: Payer: Self-pay | Admitting: Family Medicine

## 2018-11-18 ENCOUNTER — Ambulatory Visit: Payer: BC Managed Care – PPO | Admitting: Family Medicine

## 2018-11-18 ENCOUNTER — Ambulatory Visit: Payer: Self-pay | Admitting: Family Medicine

## 2018-11-18 VITALS — BP 126/80 | HR 80 | Temp 98.2°F | Resp 17 | Ht 68.0 in | Wt 154.0 lb

## 2018-11-18 DIAGNOSIS — B9689 Other specified bacterial agents as the cause of diseases classified elsewhere: Secondary | ICD-10-CM

## 2018-11-18 DIAGNOSIS — J45901 Unspecified asthma with (acute) exacerbation: Secondary | ICD-10-CM | POA: Diagnosis not present

## 2018-11-18 DIAGNOSIS — J208 Acute bronchitis due to other specified organisms: Secondary | ICD-10-CM | POA: Diagnosis not present

## 2018-11-18 MED ORDER — AZITHROMYCIN 250 MG PO TABS: ORAL_TABLET | ORAL | 0 refills | Status: DC

## 2018-11-18 MED ORDER — GUAIFENESIN-CODEINE 100-10 MG/5ML PO SOLN: 5.0000 mL | Freq: Four times a day (QID) | ORAL | 0 refills | Status: DC | PRN

## 2018-11-18 NOTE — Telephone Encounter (Signed)
Pt called in c/o having sinus congestion with on and off headaches.   She is also c/o having some shortness of breath when she climbs the stairs.   Sometimes feel it's hard to take a deep breath.   "I get a sinus infection every year in March".  See triage notes.  I scheduled her with Dr. Billey Chang for today at 2:30.    I sent these notes to the [facility]    Reason for Disposition . [1] Using nasal washes and pain medicine > 24 hours AND [2] sinus pain (around cheekbone or eye) persists  Answer Assessment - Initial Assessment Questions 1. LOCATION: "Where does it hurt?"      I am having slight headaches from sinus pain.   I got up and did the [facility] and it helped the headache.     Cloudy, yellow mucus came out.   I'm more concerned that I get sick every March.     I'm draining down my throat which makes me cough.   Sometimes I can't get a deep breath.   Yesterday I could hear myself wheezing in my throat.   No wheezing today.    I get short of breath with climbing the steps.   2. ONSET: "When did the sinus pain start?"  (e.g., hours, days)      It started with a scratchy throat Sunday then Monday evening it went into my sinuses.   I had a hard time sleeping and missed work Tuesday. 3. SEVERITY: "How bad is the pain?"   (Scale 1-10; mild, moderate or severe)   - MILD (1-3): doesn't interfere with normal activities    - MODERATE (4-7): interferes with normal activities (e.g., work or school) or awakens from sleep   - SEVERE (8-10): excruciating pain and patient unable to do any normal activities        Missing work 4. RECURRENT SYMPTOM: "Have you ever had sinus problems before?" If so, ask: "When was the last time?" and "What happened that time?"      I get this every March. 5. NASAL CONGESTION: "Is the nose blocked?" If so, ask, "Can you open it or must you breathe through the mouth?"     I'm using my Netti Pot which helps a lot.    6. NASAL DISCHARGE: "Do you  have discharge from your nose?" If so ask, "What color?"     I'm not blowing anything out but get stuff out with my Nett Pot. 7. FEVER: "Do you have a fever?" If so, ask: "What is it, how was it measured, and when did it start?"      No.   I checked it 97.3 8. OTHER SYMPTOMS: "Do you have any other symptoms?" (e.g., sore throat, cough, earache, difficulty breathing)     Scratchy throat, glands are tender in my neck, ears ok.   9. PREGNANCY: "Is there any chance you are pregnant?" "When was your last menstrual period?"     No  Protocols used: SINUS PAIN OR CONGESTION-A-AH

## 2018-11-18 NOTE — Patient Instructions (Signed)
Please follow up if symptoms do not improve or as needed.    Acute Bronchitis, Adult  Acute bronchitis is sudden (acute) swelling of the air tubes (bronchi) in the lungs. Acute bronchitis causes these tubes to fill with mucus, which can make it hard to breathe. It can also cause coughing or wheezing. In adults, acute bronchitis usually goes away within 2 weeks. A cough caused by bronchitis may last up to 3 weeks. Smoking, allergies, and asthma can make the condition worse. Repeated episodes of bronchitis may cause further lung problems, such as chronic obstructive pulmonary disease (COPD). What are the causes? This condition can be caused by germs and by substances that irritate the lungs, including:  Cold and flu viruses. This condition is most often caused by the same virus that causes a cold.  Bacteria.  Exposure to tobacco smoke, dust, fumes, and air pollution. What increases the risk? This condition is more likely to develop in people who:  Have close contact with someone with acute bronchitis.  Are exposed to lung irritants, such as tobacco smoke, dust, fumes, and vapors.  Have a weak immune system.  Have a respiratory condition such as asthma. What are the signs or symptoms? Symptoms of this condition include:  A cough.  Coughing up clear, yellow, or green mucus.  Wheezing.  Chest congestion.  Shortness of breath.  A fever.  Body aches.  Chills.  A sore throat. How is this diagnosed? This condition is usually diagnosed with a physical exam. During the exam, your health care provider may order tests, such as chest X-rays, to rule out other conditions. He or she may also:  Test a sample of your mucus for bacterial infection.  Check the level of oxygen in your blood. This is done to check for pneumonia.  Do a chest X-ray or lung function testing to rule out pneumonia and other conditions.  Perform blood tests. Your health care provider will also ask about  your symptoms and medical history. How is this treated? Most cases of acute bronchitis clear up over time without treatment. Your health care provider may recommend:  Drinking more fluids. Drinking more makes your mucus thinner, which may make it easier to breathe.  Taking a medicine for a fever or cough.  Taking an antibiotic medicine.  Using an inhaler to help improve shortness of breath and to control a cough.  Using a cool mist vaporizer or humidifier to make it easier to breathe. Follow these instructions at home: Medicines  Take over-the-counter and prescription medicines only as told by your health care provider.  If you were prescribed an antibiotic, take it as told by your health care provider. Do not stop taking the antibiotic even if you start to feel better. General instructions   Get plenty of rest.  Drink enough fluids to keep your urine pale yellow.  Avoid smoking and secondhand smoke. Exposure to cigarette smoke or irritating chemicals will make bronchitis worse. If you smoke and you need help quitting, ask your health care provider. Quitting smoking will help your lungs heal faster.  Use an inhaler, cool mist vaporizer, or humidifier as told by your health care provider.  Keep all follow-up visits as told by your health care provider. This is important. How is this prevented? To lower your risk of getting this condition again:  Wash your hands often with soap and water. If soap and water are not available, use hand sanitizer.  Avoid contact with people who have cold symptoms.  Try not to touch your hands to your mouth, nose, or eyes.  Make sure to get the flu shot every year. Contact a health care provider if:  Your symptoms do not improve in 2 weeks of treatment. Get help right away if:  You cough up blood.  You have chest pain.  You have severe shortness of breath.  You become dehydrated.  You faint or keep feeling like you are going to faint.   You keep vomiting.  You have a severe headache.  Your fever or chills gets worse. This information is not intended to replace advice given to you by your health care provider. Make sure you discuss any questions you have with your health care provider. Document Released: 10/02/2004 Document Revised: 04/08/2017 Document Reviewed: 02/13/2016 Elsevier Interactive Patient Education  2019 [employer].

## 2018-11-18 NOTE — Progress Notes (Signed)
Subjective  CC:  Chief Complaint  Patient presents with  . Sinusitis   Same day acute visit; PCP not available. Chart reviewed.   HPI: SUBJECTIVE:  Amber Washington is a 63 y.o. female who complains of congestion, nasal blockage, post nasal drip, cough described as productive and denies sinus, high fevers, SOB, chest pain or significant GI symptoms. Symptoms have been present for several days.. She denies a history of anorexia, dizziness, vomiting and wheezing.  She has a history of asthma and has been wheezing but has not felt short of breath.  She has used her inhaler and has responded well to that.  Last use was last night.  Assessment  1. Acute bacterial bronchitis   2. Asthma exacerbation, mild      Plan  Discussion:  Treat for bacterial bronchitis due to prolonged course and worsening symptoms.  Discussed mild asthma exacerbation.  Supportive care with albuterol for now.  No indication for prednisone, she will monitor her symptoms.  Education regarding differences between viral and bacterial infections and treatment options are discussed.  Supportive care measures are recommended.  We discussed the use of mucolytic's, decongestants, antihistamines and antitussives as needed.  Tylenol or Advil are recommended if needed.  Follow up: Return if symptoms worsen or fail to improve.   No orders of the defined types were placed in this encounter.  Meds ordered this encounter  Medications  . azithromycin (ZITHROMAX) 250 MG tablet    Sig: Take 2 tabs today, then 1 tab daily for 4 days    Dispense:  1 each    Refill:  0  . guaiFENesin-codeine 100-10 MG/5ML syrup    Sig: Take 5 mLs by mouth every 6 (six) hours as needed for cough.    Dispense:  120 mL    Refill:  0      I reviewed the patients updated PMH, FH, and SocHx.  Social History: Patient  reports that she has never smoked. She has never used smokeless tobacco. She reports current alcohol use. She reports that she does not use  drugs.  Patient Active Problem List   Diagnosis Date Noted  . Benign paroxysmal positional vertigo 05/14/2018  . Bronchitis, asthmatic 11/22/2015  . Post-traumatic headache 03/16/2015  . Insomnia 05/31/2014  . GERD (gastroesophageal reflux disease) 11/02/2013  . Acute upper respiratory infections of unspecified site 07/19/2013  . Physical exam, annual 02/05/2011  . History of breast cancer 06/22/2008  . BEN NEOPLASM PANCREAS EXCEPT ISLETS LANGERHANS 06/22/2008  . 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 ALLERGIC RHINITIS 06/22/2008  . Type 2 diabetes mellitus with hyperglycemia, with long-term current use of insulin ([place]) 02/24/2008  . HYPERCHOLESTEROLEMIA 02/24/2008  . ANXIETY 02/24/2008  . Essential hypertension 02/24/2008  . Asthma 02/24/2008    Review of Systems: Cardiovascular: negative for chest pain Respiratory: negative for SOB or hemoptysis Gastrointestinal: negative for abdominal pain Genitourinary: negative for dysuria or gross hematuria Current Meds  Medication Sig  . albuterol (PROVENTIL HFA;VENTOLIN HFA) 108 (90 Base) MCG/ACT inhaler Inhale 2 puffs into the lungs every 6 (six) hours as needed for wheezing or shortness of breath.  . ALPRAZolam (XANAX) 0.5 MG tablet TAKE ONE TABLET BY MOUTH THREE TIMES DAILY AS NEEDED FOR SLEEP OR FOR ANXIETY  . aspirin 81 MG tablet Take 81 mg by mouth daily.    Marland Kitchen atorvastatin (LIPITOR) 10 MG tablet TAKE 1 TABLET DAILY  . BAYER MICROLET LANCETS lancets Use 1-2x a DAY  - for [employer]  .  budesonide-formoterol (SYMBICORT) 80-4.5 MCG/ACT inhaler Inhale 2 puffs into the lungs 2 (two) times daily.  . Cholecalciferol (VITAMIN D) 2000 UNITS CAPS Take 1 capsule by mouth daily.    . Clocortolone Pivalate (CLODERM) 0.1 % cream Cloderm 0.1 % topical cream  . fluocinonide-emollient (LIDEX-E) 0.05 % cream Fluocinonide-E 0.05 % topical cream  . glucose blood test strip Use 1-2x a day - [employer]  . Insulin Glargine (BASAGLAR KWIKPEN)  100 UNIT/ML SOPN Inject 5 Units into the skin daily. INJECT 5 UNITS UNDER THE SKIN ONCE DAILY.  Marland Kitchen Insulin Pen Needle (CAREFINE PEN NEEDLES) 32G X 4 MM MISC Use 1x a day  . losartan (COZAAR) 50 MG tablet TAKE 1 TABLET DAILY  . meclizine (ANTIVERT) 25 MG tablet Take 1 tablet (25 mg total) by mouth 3 (three) times daily as needed for dizziness.  . metFORMIN (GLUCOPHAGE-XR) 500 MG 24 hr tablet Take 2 tablets (1,000 mg total) by mouth daily with supper.  . montelukast (SINGULAIR) 10 MG tablet TAKE 1 TABLET AT BEDTIME  . ONETOUCH DELICA LANCETS 62G MISC OneTouch Delica Lancets 33 gauge  . sitaGLIPtin (JANUVIA) 100 MG tablet Take 1 tablet (100 mg total) by mouth daily.    Objective  Vitals: BP 126/80   Pulse 80   Temp 98.2 F (36.8 C) (Oral)   Resp 17   Ht 5\' 8"  (1.727 m)   Wt 154 lb (69.9 kg)   SpO2 98%   BMI 23.42 kg/m  General: no acute distress, no respiratory distress Psych:  Alert and oriented, normal mood and affect HEENT:  Normocephalic, atraumatic, supple neck, moist mucous membranes, mildly erythematous pharynx without exudate, mild lymphadenopathy, supple neck Cardiovascular:  RRR without murmur. no edema Respiratory:  Good breath sounds bilaterally, CTAB with normal respiratory effort with occasional rhonchi, no wheezing Skin:  Warm, no rashes Neurologic:   Mental status is normal. normal gait  Commons side effects, risks, benefits, and alternatives for medications and treatment plan prescribed today were discussed, and the patient expressed understanding of the given instructions. Patient is instructed to call or message via MyChart if he/she has any questions or concerns regarding our treatment plan. No barriers to understanding were identified. We discussed Red Flag symptoms and signs in detail. Patient expressed understanding regarding what to do in case of urgent or emergency type symptoms.  Medication list was reconciled, printed and provided to the patient in AVS. Patient  instructions and summary information was reviewed with the patient as documented in the AVS. This note was prepared with assistance of Dragon voice recognition software. Occasional wrong-word or sound-a-like substitutions may have occurred due to the inherent limitations of voice recognition software

## 2018-11-20 ENCOUNTER — Encounter: Payer: Self-pay | Admitting: Family Medicine

## 2018-11-21 ENCOUNTER — Encounter: Payer: Self-pay | Admitting: Family Medicine

## 2018-11-24 ENCOUNTER — Encounter: Payer: Self-pay | Admitting: Family Medicine

## 2018-11-27 ENCOUNTER — Other Ambulatory Visit: Payer: Self-pay | Admitting: Family Medicine

## 2018-11-28 ENCOUNTER — Other Ambulatory Visit: Payer: Self-pay | Admitting: Internal Medicine

## 2018-11-28 ENCOUNTER — Other Ambulatory Visit: Payer: Self-pay | Admitting: Family Medicine

## 2018-11-29 MED ORDER — ALPRAZOLAM 0.5 MG PO TABS: ORAL_TABLET | ORAL | 0 refills | Status: DC

## 2018-11-29 NOTE — Telephone Encounter (Signed)
Xanax last rx 08/30/18 #90 LOV:11/18/18 acute CSC: none UDS: none

## 2018-12-10 ENCOUNTER — Encounter: Payer: BC Managed Care – PPO | Admitting: Family Medicine

## 2018-12-14 ENCOUNTER — Other Ambulatory Visit: Payer: Self-pay | Admitting: Internal Medicine

## 2018-12-21 ENCOUNTER — Encounter: Payer: Self-pay | Admitting: Internal Medicine

## 2018-12-27 ENCOUNTER — Ambulatory Visit: Payer: BC Managed Care – PPO | Admitting: Internal Medicine

## 2018-12-29 ENCOUNTER — Encounter: Payer: Self-pay | Admitting: Family Medicine

## 2018-12-30 MED ORDER — ALBUTEROL SULFATE HFA 108 (90 BASE) MCG/ACT IN AERS: 2.0000 | INHALATION_SPRAY | Freq: Four times a day (QID) | RESPIRATORY_TRACT | 2 refills | Status: DC | PRN

## 2019-01-10 ENCOUNTER — Encounter: Payer: Self-pay | Admitting: Internal Medicine

## 2019-01-11 MED ORDER — BASAGLAR KWIKPEN 100 UNIT/ML ~~LOC~~ SOPN: 6.0000 [IU] | PEN_INJECTOR | Freq: Every day | SUBCUTANEOUS | 4 refills | Status: DC

## 2019-02-17 ENCOUNTER — Other Ambulatory Visit: Payer: Self-pay | Admitting: Internal Medicine

## 2019-03-01 ENCOUNTER — Other Ambulatory Visit: Payer: Self-pay | Admitting: Family Medicine

## 2019-03-01 NOTE — Telephone Encounter (Signed)
Last OV 11/18/18 Alprazolam last filled 11/29/18 #90 with 0

## 2019-03-02 MED ORDER — ALPRAZOLAM 0.5 MG PO TABS: ORAL_TABLET | ORAL | 0 refills | Status: DC

## 2019-04-14 ENCOUNTER — Other Ambulatory Visit: Payer: Self-pay | Admitting: Family Medicine

## 2019-04-17 ENCOUNTER — Other Ambulatory Visit: Payer: Self-pay | Admitting: Family Medicine

## 2019-04-18 ENCOUNTER — Other Ambulatory Visit: Payer: Self-pay | Admitting: Internal Medicine

## 2019-04-18 MED ORDER — LOSARTAN POTASSIUM 50 MG PO TABS: 50.0000 mg | ORAL_TABLET | Freq: Every day | ORAL | 0 refills | Status: DC

## 2019-04-28 ENCOUNTER — Other Ambulatory Visit: Payer: Self-pay

## 2019-04-29 ENCOUNTER — Ambulatory Visit: Payer: BC Managed Care – PPO | Admitting: Family Medicine

## 2019-05-02 ENCOUNTER — Ambulatory Visit (INDEPENDENT_AMBULATORY_CARE_PROVIDER_SITE_OTHER): Payer: BC Managed Care – PPO | Admitting: Internal Medicine

## 2019-05-02 ENCOUNTER — Other Ambulatory Visit: Payer: Self-pay

## 2019-05-02 ENCOUNTER — Encounter: Payer: Self-pay | Admitting: Internal Medicine

## 2019-05-02 VITALS — BP 138/80 | HR 78 | Ht 68.0 in | Wt 158.0 lb

## 2019-05-02 DIAGNOSIS — E1165 Type 2 diabetes mellitus with hyperglycemia: Secondary | ICD-10-CM

## 2019-05-02 DIAGNOSIS — Z794 Long term (current) use of insulin: Secondary | ICD-10-CM | POA: Diagnosis not present

## 2019-05-02 DIAGNOSIS — E7800 Pure hypercholesterolemia, unspecified: Secondary | ICD-10-CM

## 2019-05-02 LAB — POCT GLYCOSYLATED HEMOGLOBIN (HGB A1C): Hemoglobin A1C: 7.1 % — AB (ref 4.0–5.6)

## 2019-05-02 MED ORDER — METFORMIN HCL ER 500 MG PO TB24: ORAL_TABLET | ORAL | 3 refills | Status: DC

## 2019-05-02 MED ORDER — SITAGLIPTIN PHOSPHATE 100 MG PO TABS: 100.0000 mg | ORAL_TABLET | Freq: Every day | ORAL | 11 refills | Status: DC

## 2019-05-02 NOTE — Progress Notes (Signed)
Patient ID: Amber Washington, female   DOB: 1955-12-20, 63 y.o.   MRN: [id number]   HPI: Amber Washington is a 63 y.o.-year-old female, returning for f/u for DM s/p distal pancreatectomy for a pancreatic cyst, dx in 2000, insulin-dependent, uncontrolled, without long term complications.  Last visit 8 months ago.  Latest HbA1c: Lab Results  Component Value Date   HGBA1C 6.4 (A) 08/18/2018   HGBA1C 6.9 (A) 04/07/2018   HGBA1C 7.3 12/01/2017   She is on: - Januvia 100 mg before breakfast - Metformin ER 1000 mg with dinner - Lantus >> Basaglar 5 units daily at bedtime >> in a.m. >> increase to 6 units 05/2018 She was on Jardiance 25 mg before b'fast >> yeast inf's >> stopped.  Pt checks sugars 1-2 times a day: - am: 106-139, 155 >> 99-141, 156, 167 >> 105-154, 174 - 2h after b'fast: 130 >> 126 >> 104 >> n/c >> 162 - before lunch: 109 >> 107-119 >> 87, 163 >> 103-121, 177 - 2h after lunch: 228 >> n/c >> 80 , 101 >> n/c >> 174, 179 - before dinner:  64, 81-143, 152 >> 83-154, 179 >> 79, 80-159, 198 - 2h after dinner: 182 >> n/c >> 165, 194 >> n/c >> 130, 228 - bedtime: 1 100-161, 233 >> n/c >> 129 >> n/c - nighttime:  got glu tablets >> n/c Lowest: 64 after exercise >> 79 after a walk Highest: 228 x1  Glucometer: Bayer Contour  Pt's meals are: - Breakfast: oatmeal + yoghurt + crackers - Lunch: salads + meat/cheese/crackers + fruit - Dinner: chicken + veggies or salad - Snacks: nuts , crackers, carrots - 1-x a day Drinks water only.  She continues to exercise by walking, lifting weights.  -No CKD, last BUN/creatinine:  Lab Results  Component Value Date   BUN 17 06/10/2018   BUN 19 06/29/2017   CREATININE 0.74 06/10/2018   CREATININE 0.66 06/29/2017   Her ACR was normal: Lab Results  Component Value Date   MICRALBCREAT 3.4 06/28/2012   MICRALBCREAT 2.3 01/27/2011   MICRALBCREAT 3.2 02/05/2010  On losartan.  -+ HL; last set of lipids: Lab Results  Component Value Date   CHOL 139 06/10/2018   HDL 51.40 06/10/2018   LDLCALC 70 06/10/2018   TRIG 91.0 06/10/2018   CHOLHDL 3 06/10/2018  On Lipitor.  - last eye exam was in 07/2018: No DR  -No numbness and tingling in her feet.  She also has HTN.  She complains of insomnia, which is chronic for her.  Xanax helps.  This is accentuated now in the setting of the coronavirus pandemic.  She works from home currently on 03/2019, now at school -works 3 days a week.  She is considering early retirement.  ROS: Constitutional: no weight gain/no weight loss, no fatigue, no subjective hyperthermia, no subjective hypothermia, + insomnia Eyes: no blurry vision, no xerophthalmia ENT: no sore throat, no nodules palpated in neck, no dysphagia, no odynophagia, no hoarseness Cardiovascular: no CP/no SOB/no palpitations/no leg swelling Respiratory: no cough/no SOB/no wheezing Gastrointestinal: no N/no V/no D/no C/no acid reflux Musculoskeletal: no muscle aches/no joint aches Skin: no rashes, no hair loss Neurological: no tremors/no numbness/no tingling/no dizziness  I reviewed pt's medications, allergies, PMH, social hx, family hx, and changes were documented in the history of present illness. Otherwise, unchanged from my initial visit note.  Past Medical History:  Diagnosis Date  . Allergic rhinitis   . Anxiety state, unspecified   . Asthma   .  Benign neoplasm of pancreas, except islets of Langerhans   . Borderline hypertension   . Hypercholesteremia   . Malignant neoplasm of breast (female), unspecified site   . Osteoporosis, unspecified   . Personal history of chemotherapy   . Personal history of radiation therapy   . Stenosis of nasolacrimal duct, acquired   . Type II or unspecified type diabetes mellitus without mention of complication, not stated as uncontrolled    Past Surgical History:  Procedure Laterality Date  . BREAST BIOPSY    . BREAST EXCISIONAL BIOPSY    . BREAST LUMPECTOMY     [id number] 1995  .  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 NASAL SEPTUM SURGERY    . right breast cancer sugery  1995  . surgery on neck for removal of birthmark    . tur induction     Social History   Social History  . Marital status: Married    Spouse name: N/A  . Number of children: 2   Occupational History  . Bookkeeper   Social History Main Topics  . Smoking status: Never Smoker  . Smokeless tobacco: Never Used  . Alcohol use No  . Drug use: No   Current Outpatient Medications on File Prior to Visit  Medication Sig Dispense Refill  . albuterol (VENTOLIN HFA) 108 (90 Base) MCG/ACT inhaler Inhale 2 puffs into the lungs every 6 (six) hours as needed for wheezing or shortness of breath. 1 Inhaler 2  . ALPRAZolam (XANAX) 0.5 MG tablet TAKE ONE TABLET BY MOUTH THREE TIMES DAILY AS NEEDED FOR SLEEP OR FOR ANXIETY 90 tablet 0  . aspirin 81 MG tablet Take 81 mg by mouth daily.      Marland Kitchen atorvastatin (LIPITOR) 10 MG tablet TAKE 1 TABLET DAILY 90 tablet 1  . BAYER MICROLET LANCETS lancets Use 1-2x a DAY  - for Bayer Contour 100 each 12  . budesonide-formoterol (SYMBICORT) 80-4.5 MCG/ACT inhaler Inhale 2 puffs into the lungs 2 (two) times daily. 1 Inhaler 3  . Cholecalciferol (VITAMIN D) 2000 UNITS CAPS Take 1 capsule by mouth daily.      . Clocortolone Pivalate (CLODERM) 0.1 % cream Cloderm 0.1 % topical cream    . fluocinonide-emollient (LIDEX-E) 0.05 % cream Fluocinonide-E 0.05 % topical cream    . glucose blood test strip Use 1-2x a day - Bayer Contour 100 each 12  . Insulin Glargine (BASAGLAR KWIKPEN) 100 UNIT/ML SOPN Inject 0.06 mLs (6 Units total) into the skin daily. 5 pen 4  . Insulin Pen Needle (CAREFINE PEN NEEDLES) 32G X 4 MM MISC Use 1x a day 100 each 3  . JANUVIA 100 MG tablet TAKE 1 TABLET BY MOUTH DAILY 30 tablet 1  . losartan (COZAAR) 50 MG tablet Take 1 tablet (50 mg total) by mouth daily. 90 tablet 0  . meclizine (ANTIVERT) 25 MG tablet Take 1  tablet (25 mg total) by mouth 3 (three) times daily as needed for dizziness. 45 tablet 0  . metFORMIN (GLUCOPHAGE-XR) 500 MG 24 hr tablet TAKE 2 TABLETS (=1000MG     TOTAL) DAILY WITH SUPPER 180 tablet 3  . montelukast (SINGULAIR) 10 MG tablet TAKE 1 TABLET AT BEDTIME 90 tablet 0  . ONETOUCH DELICA LANCETS 99991111 MISC OneTouch Delica Lancets 33 gauge     No current facility-administered medications on file prior to visit.    Allergies  Allergen Reactions  . Influenza Vaccines Other (See Comments)    Unknown, allergic to  egg whites  . Eggs Or Egg-Derived Products    FH: - mother - leukemia, father - liver cancer - mother - HTN - DM in nephew.  PE: BP 138/80   Pulse 78   Ht 5\' 8"  (1.727 m)   Wt 158 lb (71.7 kg)   SpO2 98%   BMI 24.02 kg/m  Wt Readings from Last 3 Encounters:  05/02/19 158 lb (71.7 kg)  11/18/18 154 lb (69.9 kg)  08/18/18 154 lb (69.9 kg)   Constitutional: overweight, in NAD Eyes: PERRLA, EOMI, no exophthalmos ENT: moist mucous membranes, no thyromegaly, no cervical lymphadenopathy Cardiovascular: RRR, No MRG, + arcus cornealis Respiratory: CTA B Gastrointestinal: abdomen soft, NT, ND, BS+ Musculoskeletal: no deformities, strength intact in all 4 Skin: moist, warm, no rashes Neurological: no tremor with outstretched hands, DTR normal in all 4  ASSESSMENT: 1. DM, insulin-dependent, uncontrolled, without long term complications, but with hyperglycemia  Component     Latest Ref Rng & Units 12/04/2016  C-Peptide     0.80 - 3.85 ng/mL 0.94  Glucose, Fasting     65 - 99 mg/dL 160 (H)  C-peptide is not very high, but is still in the normal range.   She has a history of distal pancreatic resection (?%) For a benign cyst and she developed diabetes soon after the surgery.  2. HL  PLAN:  1. Patient with history of diabetes due to distal pancreatectomy for pancreatic cyst.  Her sugars improved after starting low-dose basal insulin.  She is now on a very low dose  of 6 units daily.  We also moved Lantus in the morning to avoid hypoglycemia during the night.  She also continues metformin and Januvia. - At last visit HbA1c was 6.9%.  At that time, she was having higher blood sugars in the morning with occasional spikes in the 150s and 160s.  I advised her to also check some sugars at that time.  She was also having occasional spikes later in the day but these were after a snack.  We did not change her regimen at last visit. -As usual, she brings a very good log and we reviewed this today.  Sugars are at or slightly above goal.  He had one very high blood sugar after dinner which is not usually checking at this time of the day so it is recommended while her sugars higher in the morning.  We discussed that it is important for her to check at bedtime since the high blood sugars in the morning could be due to increased blood sugars after dinner (in which case we need to cover her dinner better) or to increased level gluconeogenesis overnight, in which case, move Basaglar at night to lower increase the dose.  For now, I advised her to increase the Metformin by also adding 1 or 2 tablets in the morning depending on her sugars. - I suggested to:  Patient Instructions  Please continue: - Januvia 100 mg before b'fast - Basaglar 6 units in a.m.  Please increase: - Metformin ER to [phone] mg with b'fast and continue 1000 mg with dinner  Please return in 4 months with your sugar log.   - we checked her HbA1c: 7.1% (higher)  - advised to check sugars at different times of the day - 1x a day, rotating check times - advised for yearly eye exams >> she is UTD - return to clinic in 4 months     2. HL - Reviewed latest lipid panel from  06/2018: All fractions at goal Lab Results  Component Value Date   CHOL 139 06/10/2018   HDL 51.40 06/10/2018   LDLCALC 70 06/10/2018   TRIG 91.0 06/10/2018   CHOLHDL 3 06/10/2018  - Continues the Lipitor without side effects. - She  has arcus cornealis   Philemon Kingdom, MD PhD [facility] Endocrinology

## 2019-05-02 NOTE — Addendum Note (Signed)
Addended by: Cardell Peach I on: 05/02/2019 04:51 PM   Modules accepted: Orders

## 2019-05-02 NOTE — Patient Instructions (Addendum)
Please continue: - Januvia 100 mg before b'fast - Basaglar 6 units in a.m.  Please increase: - Metformin ER to [phone] mg with b'fast and continue 1000 mg with dinner  Please return in 4 months with your sugar log.

## 2019-05-03 ENCOUNTER — Encounter: Payer: Self-pay | Admitting: Family Medicine

## 2019-05-03 ENCOUNTER — Ambulatory Visit (INDEPENDENT_AMBULATORY_CARE_PROVIDER_SITE_OTHER): Payer: BC Managed Care – PPO | Admitting: Family Medicine

## 2019-05-03 VITALS — BP 121/82 | HR 71 | Temp 98.0°F | Resp 16 | Ht 68.0 in | Wt 158.2 lb

## 2019-05-03 DIAGNOSIS — I10 Essential (primary) hypertension: Secondary | ICD-10-CM | POA: Diagnosis not present

## 2019-05-03 DIAGNOSIS — Z23 Encounter for immunization: Secondary | ICD-10-CM | POA: Diagnosis not present

## 2019-05-03 DIAGNOSIS — Z0000 Encounter for general adult medical examination without abnormal findings: Secondary | ICD-10-CM | POA: Diagnosis not present

## 2019-05-03 LAB — CBC WITH DIFFERENTIAL/PLATELET
Basophils Absolute: 0.1 10*3/uL (ref 0.0–0.1)
Basophils Relative: 0.9 % (ref 0.0–3.0)
Eosinophils Absolute: 0.2 10*3/uL (ref 0.0–0.7)
Eosinophils Relative: 2.5 % (ref 0.0–5.0)
HCT: 37.2 % (ref 36.0–46.0)
Hemoglobin: 12.3 g/dL (ref 12.0–15.0)
Lymphocytes Relative: 32.5 % (ref 12.0–46.0)
Lymphs Abs: 2.5 10*3/uL (ref 0.7–4.0)
MCHC: 33 g/dL (ref 30.0–36.0)
MCV: 88.3 fl (ref 78.0–100.0)
Monocytes Absolute: 0.9 10*3/uL (ref 0.1–1.0)
Monocytes Relative: 12.2 % — ABNORMAL HIGH (ref 3.0–12.0)
Neutro Abs: 4 10*3/uL (ref 1.4–7.7)
Neutrophils Relative %: 51.9 % (ref 43.0–77.0)
Platelets: 322 10*3/uL (ref 150.0–400.0)
RBC: 4.21 Mil/uL (ref 3.87–5.11)
RDW: 13.2 % (ref 11.5–15.5)
WBC: 7.7 10*3/uL (ref 4.0–10.5)

## 2019-05-03 LAB — BASIC METABOLIC PANEL
BUN: 20 mg/dL (ref 6–23)
CO2: 31 mEq/L (ref 19–32)
Calcium: 9.7 mg/dL (ref 8.4–10.5)
Chloride: 98 mEq/L (ref 96–112)
Creatinine, Ser: 0.78 mg/dL (ref 0.40–1.20)
GFR: 75.13 mL/min (ref >60.00)
Glucose, Bld: 113 mg/dL — ABNORMAL HIGH (ref 70–99)
Potassium: 4.2 mEq/L (ref 3.5–5.1)
Sodium: 136 mEq/L (ref 135–145)

## 2019-05-03 LAB — LIPID PANEL
Cholesterol: 151 mg/dL (ref 0–200)
HDL: 49.6 mg/dL (ref >39.00)
LDL Cholesterol: 79 mg/dL (ref 0–99)
NonHDL: 100.93
Total CHOL/HDL Ratio: 3
Triglycerides: 108 mg/dL (ref 0.0–149.0)
VLDL: 21.6 mg/dL (ref 0.0–40.0)

## 2019-05-03 LAB — HEPATIC FUNCTION PANEL
ALT: 12 U/L (ref 0–35)
AST: 15 U/L (ref 0–37)
Albumin: 4.5 g/dL (ref 3.5–5.2)
Alkaline Phosphatase: 80 U/L (ref 39–117)
Bilirubin, Direct: 0.1 mg/dL (ref 0.0–0.3)
Total Bilirubin: 0.5 mg/dL (ref 0.2–1.2)
Total Protein: 7.1 g/dL (ref 6.0–8.3)

## 2019-05-03 LAB — TSH: TSH: 1.4 u[IU]/mL (ref 0.35–4.50)

## 2019-05-03 NOTE — Assessment & Plan Note (Signed)
Chronic problem.  Well controlled.  Asymptomatic.  Check labs.  No anticipated med changes.  Will follow. 

## 2019-05-03 NOTE — Assessment & Plan Note (Signed)
Pt's PE WNL.  UTD on colonoscopy, GYN.  Pt was able to get preservative free flu shot the last 2 yrs.  Attempting to get one for her.  Check labs.  Anticipatory guidance provided.

## 2019-05-03 NOTE — Addendum Note (Signed)
Addended by: Davis Gourd on: 05/03/2019 11:23 AM   Modules accepted: Orders

## 2019-05-03 NOTE — Patient Instructions (Signed)
Follow up in 6 months to recheck BP and cholesterol We'll notify you of your lab results and make any changes if needed Keep up the good work!  You look great! If the bad days become too much- let me know!! Call with any questions or concerns Stay Safe!!

## 2019-05-03 NOTE — Progress Notes (Signed)
   Subjective:    Patient ID: Amber Washington, female    DOB: 06/22/1956, 63 y.o.   MRN: [id number]  HPI CPE- UTD on colonoscopy, mammo, eye exam, foot exam.  GYN- [facility]  Allergic to flu shot   Review of Systems Patient reports no vision/ hearing changes, adenopathy,fever, weight change,  persistant/recurrent hoarseness , swallowing issues, chest pain, palpitations, edema, persistant/recurrent cough, hemoptysis, dyspnea (rest/exertional/paroxysmal nocturnal), gastrointestinal bleeding (melena, rectal bleeding), abdominal pain, significant heartburn, bowel changes, GU symptoms (dysuria, hematuria, incontinence), Gyn symptoms (abnormal  bleeding, pain),  syncope, focal weakness, memory loss, numbness & tingling, skin/hair/nail changes, abnormal bruising or bleeding, anxiety, or depression.     Objective:   Physical Exam General Appearance:    Alert, cooperative, no distress, appears stated age  Head:    Normocephalic, without obvious abnormality, atraumatic  Eyes:    PERRL, conjunctiva/corneas clear, EOM's intact, fundi    benign, both eyes  Ears:    Normal TM's and external ear canals, both ears  Nose:   Deferred due to COVID  Throat:   Neck:   Supple, symmetrical, trachea midline, no adenopathy;    Thyroid: no enlargement/tenderness/nodules  Back:     Symmetric, no curvature, ROM normal, no CVA tenderness  Lungs:     Clear to auscultation bilaterally, respirations unlabored  Chest Wall:    No tenderness or deformity   Heart:    Regular rate and rhythm, S1 and S2 normal, no murmur, rub   or gallop  Breast Exam:    Deferred to GYN  Abdomen:     Soft, non-tender, bowel sounds active all four quadrants,    no masses, no organomegaly  Genitalia:    Deferred to GYN  Rectal:    Extremities:   Extremities normal, atraumatic, no cyanosis or edema  Pulses:   2+ and symmetric all extremities  Skin:   Skin color, texture, turgor normal, no rashes or lesions  Lymph nodes:   Cervical,  supraclavicular, and axillary nodes normal  Neurologic:   CNII-XII intact, normal strength, sensation and reflexes    throughout          Assessment & Plan:

## 2019-05-19 ENCOUNTER — Ambulatory Visit: Payer: BC Managed Care – PPO | Admitting: Family Medicine

## 2019-05-22 ENCOUNTER — Other Ambulatory Visit: Payer: Self-pay | Admitting: Family Medicine

## 2019-05-23 NOTE — Telephone Encounter (Signed)
Last refill: 6.24.20 #90,0 Last OV: 8.25.20 dx. CPE

## 2019-05-24 MED ORDER — ALPRAZOLAM 0.5 MG PO TABS: ORAL_TABLET | ORAL | 0 refills | Status: DC

## 2019-05-25 ENCOUNTER — Encounter

## 2019-05-25 ENCOUNTER — Ambulatory Visit: Payer: BC Managed Care – PPO | Admitting: Family Medicine

## 2019-06-24 ENCOUNTER — Other Ambulatory Visit: Payer: Self-pay | Admitting: Gynecology

## 2019-06-24 DIAGNOSIS — Z1231 Encounter for screening mammogram for malignant neoplasm of breast: Secondary | ICD-10-CM

## 2019-06-26 ENCOUNTER — Other Ambulatory Visit: Payer: Self-pay | Admitting: Family Medicine

## 2019-06-30 ENCOUNTER — Encounter: Payer: Self-pay | Admitting: Family Medicine

## 2019-08-01 LAB — HM MAMMOGRAPHY: HM Mammogram: NORMAL (ref 0–4)

## 2019-08-14 ENCOUNTER — Other Ambulatory Visit: Payer: Self-pay | Admitting: Family Medicine

## 2019-08-15 MED ORDER — ALPRAZOLAM 0.5 MG PO TABS: ORAL_TABLET | ORAL | 0 refills | Status: DC

## 2019-08-15 NOTE — Telephone Encounter (Signed)
Last refill: 9.15.20 #90, 0 Last OV: 8.25.20 dx. CPE

## 2019-08-17 ENCOUNTER — Other Ambulatory Visit: Payer: Self-pay

## 2019-08-17 ENCOUNTER — Ambulatory Visit
Admission: RE | Admit: 2019-08-17 | Discharge: 2019-08-17 | Disposition: A | Discharge status: Home / Self Care | Payer: BC Managed Care – PPO | Source: Ambulatory Visit | Attending: Gynecology | Admitting: Gynecology

## 2019-08-17 DIAGNOSIS — Z1231 Encounter for screening mammogram for malignant neoplasm of breast: Secondary | ICD-10-CM

## 2019-08-29 ENCOUNTER — Ambulatory Visit (INDEPENDENT_AMBULATORY_CARE_PROVIDER_SITE_OTHER): Payer: BC Managed Care – PPO | Admitting: Internal Medicine

## 2019-08-29 ENCOUNTER — Encounter: Payer: Self-pay | Admitting: Internal Medicine

## 2019-08-29 ENCOUNTER — Other Ambulatory Visit: Payer: Self-pay

## 2019-08-29 VITALS — BP 110/80 | HR 75 | Ht 68.0 in | Wt 158.0 lb

## 2019-08-29 DIAGNOSIS — E1165 Type 2 diabetes mellitus with hyperglycemia: Secondary | ICD-10-CM

## 2019-08-29 DIAGNOSIS — Z794 Long term (current) use of insulin: Secondary | ICD-10-CM | POA: Diagnosis not present

## 2019-08-29 DIAGNOSIS — E7800 Pure hypercholesterolemia, unspecified: Secondary | ICD-10-CM | POA: Diagnosis not present

## 2019-08-29 LAB — POCT GLYCOSYLATED HEMOGLOBIN (HGB A1C): Hemoglobin A1C: 6.8 % — AB (ref 4.0–5.6)

## 2019-08-29 NOTE — Addendum Note (Signed)
Addended by: Cardell Peach I on: 08/29/2019 11:06 AM   Modules accepted: Orders

## 2019-08-29 NOTE — Patient Instructions (Addendum)
Please continue: - Metformin ER 500 (may try 1000) mg with b'fast and 1000 mg with dinner - Januvia  100 mg before b'fast - Basaglar 6 units in a.m. (may try 7-8 units)  Please return in 4 months with your sugar log.

## 2019-08-29 NOTE — Progress Notes (Signed)
Patient ID: Amber Washington, female   DOB: 10/16/1955, 63 y.o.   MRN: [id number]   This visit occurred during the SARS-CoV-2 public health emergency.  Safety protocols were in place, including screening questions prior to the visit, additional usage of staff PPE, and extensive cleaning of exam room while observing appropriate contact time as indicated for disinfecting solutions.   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  Last visit 4 months ago.  Reviewed HbA1c levels: Lab Results  Component Value Date   HGBA1C 7.1 (A) 05/02/2019   HGBA1C 6.4 (A) 08/18/2018   HGBA1C 6.9 (A) 04/07/2018   She is on: - Januvia 100 mg before breakfast - Metformin ER 500 mg with breakfast and 1000 mg with dinner dinner - Lantus >> Basaglar 5 units daily at bedtime >> in a.m. >> increase to 6 units 05/2018 She was on Jardiance 25 mg before b'fast >> yeast inf's >> stopped.  Pt checks sugars 1-2 times a day: - am:  99-141, 156, 167 >> 105-154, 174 >> 111-155 - 2h after b'fast: 130 >> 126 >> 104 >> n/c >> 162 >> n/c - before lunch:  87, 163 >> 103-121, 177 >> 94, 100-144 - 2h after lunch: 228 >> n/c >> 80 , 101 >> n/c >> 174, 179 >> n/c - before dinner:  83-154, 179 >> 79, 80-159, 198 >> 115-173, 180 - 2h after dinner: n/c >> 165, 194 >> n/c >> 130, 228 >> n/c - bedtime: 100-161, 233 >> n/c >> 129 >> n/c - nighttime:  got glu tablets >> n/c Lowest: 64 after exercise >> 79 after a walk >> 94 Highest: 228 x1 >> 289 (05/2019)  Glucometer: Bayer Contour  Pt's meals are: - Breakfast: oatmeal + yoghurt + crackers - Lunch: salads + meat/cheese/crackers + fruit - Dinner: chicken + veggies or salad - Snacks: nuts , crackers, carrots - 1-x a day Drinks water only.  She continues to exercise by walking, lifting weights.  -No CKD, last BUN/creatinine:   Lab Results  Component Value Date   BUN 20 05/03/2019   BUN 17 06/10/2018   CREATININE 0.78 05/03/2019   CREATININE 0.74 06/10/2018   ACR has been normal: Lab Results  Component Value Date   MICRALBCREAT 3.4 06/28/2012   MICRALBCREAT 2.3 01/27/2011   MICRALBCREAT 3.2 02/05/2010  On losartan.  -+ HL; last set of lipids: Lab Results  Component Value Date   CHOL 151 05/03/2019   HDL 49.60 05/03/2019   LDLCALC 79 05/03/2019   TRIG 108.0 05/03/2019   CHOLHDL 3 05/03/2019  On Lipitor.  - last eye exam was in 07/2018: No DR. Coming up this week.  -No numbness and tingling in her feet.  She also has HTN.  She has chronic insomnia Xanax helps.  This is accentuated now in the setting of the coronavirus pandemic.  She works from home currently on 03/2019, now at school -works 3 days a week.  She is considering early retirement.  ROS: Constitutional: no weight gain/no weight loss, no fatigue, no subjective hyperthermia, no subjective hypothermia Eyes: no blurry vision, no xerophthalmia ENT: no sore throat, no nodules palpated in neck, no dysphagia, no odynophagia, no hoarseness Cardiovascular: no CP/no SOB/no palpitations/no leg swelling Respiratory: no cough/no SOB/no wheezing Gastrointestinal: no N/no V/no D/no C/no acid reflux Musculoskeletal: no muscle aches/no joint aches Skin: no rashes, no hair  loss Neurological: no tremors/no numbness/no tingling/no dizziness  I reviewed pt's medications, allergies, PMH, social hx, family hx, and changes were documented in the history of present illness. Otherwise, unchanged from my initial visit note.  Past Medical History:  Diagnosis Date  . Allergic rhinitis   . Anxiety state, unspecified   . Asthma   . Benign neoplasm of pancreas, except islets of Langerhans   . Borderline hypertension   . Hypercholesteremia   . Malignant neoplasm of breast (female), unspecified site   . Osteoporosis, unspecified   . Personal history of  chemotherapy   . Personal history of radiation therapy   . Stenosis of nasolacrimal duct, acquired   . Type II or unspecified type diabetes mellitus without mention of complication, not stated as uncontrolled    Past Surgical History:  Procedure Laterality Date  . BREAST BIOPSY    . BREAST EXCISIONAL BIOPSY    . BREAST LUMPECTOMY     [id number] 1995  . 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 NASAL SEPTUM SURGERY    . right breast cancer sugery  1995  . surgery on neck for removal of birthmark    . tur induction     Social History   Social History  . Marital status: Married    Spouse name: N/A  . Number of children: 2   Occupational History  . Bookkeeper   Social History Main Topics  . Smoking status: Never Smoker  . Smokeless tobacco: Never Used  . Alcohol use No  . Drug use: No   Current Outpatient Medications on File Prior to Visit  Medication Sig Dispense Refill  . albuterol (VENTOLIN HFA) 108 (90 Base) MCG/ACT inhaler Inhale 2 puffs into the lungs every 6 (six) hours as needed for wheezing or shortness of breath. 1 Inhaler 2  . ALPRAZolam (XANAX) 0.5 MG tablet TAKE ONE TABLET BY MOUTH THREE TIMES DAILY AS NEEDED FOR SLEEP OR FOR ANXIETY 90 tablet 0  . aspirin 81 MG tablet Take 81 mg by mouth daily.      Marland Kitchen atorvastatin (LIPITOR) 10 MG tablet TAKE 1 TABLET DAILY 90 tablet 1  . BAYER MICROLET LANCETS lancets Use 1-2x a DAY  - for Bayer Contour 100 each 12  . budesonide-formoterol (SYMBICORT) 80-4.5 MCG/ACT inhaler Symbicort 80 mcg-4.5 mcg/actuation HFA aerosol inhaler  INHALE 2 PUFFS BY MOUTH TWICE DAILY    . Cholecalciferol (VITAMIN D) 2000 UNITS CAPS Take 1 capsule by mouth daily.      . Clocortolone Pivalate (CLODERM) 0.1 % cream Cloderm 0.1 % topical cream    . fluocinonide-emollient (LIDEX-E) 0.05 % cream Fluocinonide-E 0.05 % topical cream    . glucose blood test strip Use 1-2x a day - Bayer Contour 100 each 12  . Insulin  Glargine (BASAGLAR KWIKPEN) 100 UNIT/ML SOPN Inject 0.06 mLs (6 Units total) into the skin daily. 5 pen 4  . Insulin Pen Needle (CAREFINE PEN NEEDLES) 32G X 4 MM MISC Use 1x a day 100 each 3  . losartan (COZAAR) 50 MG tablet TAKE 1 TABLET DAILY 90 tablet 0  . meclizine (ANTIVERT) 25 MG tablet Take 1 tablet (25 mg total) by mouth 3 (three) times daily as needed for dizziness. 45 tablet 0  . metFORMIN (GLUCOPHAGE-XR) 500 MG 24 hr tablet TAKE 2 TABLETS 2x a day with meals 360 tablet 3  . montelukast (SINGULAIR) 10 MG tablet TAKE 1 TABLET AT BEDTIME 90 tablet 0  . ONETOUCH DELICA LANCETS  33G MISC OneTouch Delica Lancets 33 gauge    . sitaGLIPtin (JANUVIA) 100 MG tablet Take 1 tablet (100 mg total) by mouth daily. 30 tablet 11   No current facility-administered medications on file prior to visit.   Allergies  Allergen Reactions  . Influenza Vaccines Other (See Comments)    Unknown, allergic to egg whites  . Eggs Or Egg-Derived Products    FH: - mother - leukemia, father - liver cancer - mother - HTN - DM in nephew.  PE: BP 110/80   Pulse 75   Ht 5\' 8"  (1.727 m)   Wt 158 lb (71.7 kg)   SpO2 97%   BMI 24.02 kg/m  Wt Readings from Last 3 Encounters:  08/29/19 158 lb (71.7 kg)  05/03/19 158 lb 4 oz (71.8 kg)  05/02/19 158 lb (71.7 kg)   Constitutional: normal weight, in NAD Eyes: PERRLA, EOMI, no exophthalmos ENT: moist mucous membranes, no thyromegaly, no cervical lymphadenopathy Cardiovascular: RRR, No MRG Respiratory: CTA B Gastrointestinal: abdomen soft, NT, ND, BS+ Musculoskeletal: no deformities, strength intact in all 4 Skin: moist, warm, no rashes Neurological: no tremor with outstretched hands, DTR normal in all 4   ASSESSMENT: 1. DM, insulin-dependent, uncontrolled, without long term complications, but with hyperglycemia  Component     Latest Ref Rng & Units 12/04/2016  C-Peptide     0.80 - 3.85 ng/mL 0.94  Glucose, Fasting     65 - 99 mg/dL 160 (H)  C-peptide  is not very high, but is still in the normal range.   She has a history of distal pancreatic resection (?%) For a benign cyst and she developed diabetes soon after the surgery.  2. HL  PLAN:  1. Patient with history of diabetes due to distal pancreatectomy for pancreatic cyst.  Her sugars improved after starting a low-dose basal insulin.  She takes a minimum dose of 6 units daily.  We moved this in the morning to avoid hypoglycemia during the night.  She also continues Metformin (which was increased at last visit) Januvia.  At last visit HbA1c was higher, at 7.1% and sugars are slightly above goal at that time.  She was not checking sugars after dinner but she only checked once and this was quite high so I advised her to try to check at bedtime few times a week. - at this visit, sugars are at or slightly above target, better in last 2 mo, but overall not much changed from last OV. We discussed about possibly increasing am Metformin (she is now only taking 1 tab in am) or increasing Basaglar slightly (by 1-2 units). - I suggested to:  Patient Instructions  Please continue: - Metformin ER 500 (may try 1000) mg with b'fast and 1000 mg with dinner - Januvia  100 mg before b'fast - Basaglar 6 units in a.m. (may try 7-8 units)  Please return in 4 months with your sugar log.   - we checked her HbA1c: 6.8% (better) - advised to check sugars at different times of the day - 1x a day, rotating check times - advised for yearly eye exams >> she is not UTD but new exam coming up - return to clinic in 4 months     2. HL -Reviewed latest lipid panel from 04/2019: Fractions at goal Lab Results  Component Value Date   CHOL 151 05/03/2019   HDL 49.60 05/03/2019   LDLCALC 79 05/03/2019   TRIG 108.0 05/03/2019   CHOLHDL 3 05/03/2019  -Continues  Lipitor without side effects -Of note, she has arcus cornealis  Philemon Kingdom, MD PhD [facility] End[facility]

## 2019-08-31 LAB — HM DIABETES EYE EXAM

## 2019-09-29 ENCOUNTER — Encounter: Payer: Self-pay | Admitting: Family Medicine

## 2019-10-08 ENCOUNTER — Other Ambulatory Visit: Payer: Self-pay | Admitting: Family Medicine

## 2019-11-01 ENCOUNTER — Other Ambulatory Visit: Payer: Self-pay

## 2019-11-01 ENCOUNTER — Ambulatory Visit (INDEPENDENT_AMBULATORY_CARE_PROVIDER_SITE_OTHER): Payer: BC Managed Care – PPO | Admitting: Family Medicine

## 2019-11-01 ENCOUNTER — Encounter: Payer: Self-pay | Admitting: Family Medicine

## 2019-11-01 VITALS — BP 128/84 | Temp 97.3°F | Ht 68.0 in | Wt 155.0 lb

## 2019-11-01 DIAGNOSIS — Z794 Long term (current) use of insulin: Secondary | ICD-10-CM

## 2019-11-01 DIAGNOSIS — E7800 Pure hypercholesterolemia, unspecified: Secondary | ICD-10-CM | POA: Diagnosis not present

## 2019-11-01 DIAGNOSIS — I10 Essential (primary) hypertension: Secondary | ICD-10-CM

## 2019-11-01 DIAGNOSIS — E1165 Type 2 diabetes mellitus with hyperglycemia: Secondary | ICD-10-CM | POA: Diagnosis not present

## 2019-11-01 MED ORDER — ATORVASTATIN CALCIUM 10 MG PO TABS: 10.0000 mg | ORAL_TABLET | Freq: Every day | ORAL | 1 refills | Status: DC

## 2019-11-01 MED ORDER — ALPRAZOLAM 0.5 MG PO TABS: ORAL_TABLET | ORAL | 0 refills | Status: DC

## 2019-11-01 NOTE — Progress Notes (Signed)
I have discussed the procedure for the virtual visit with the patient who has given consent to proceed with assessment and treatment.   Jessica L Brodmerkel, CMA     

## 2019-11-01 NOTE — Progress Notes (Signed)
Virtual Visit via Video   I connected with patient on 11/01/19 at  8:30 AM EST by a video enabled telemedicine application and verified that I am speaking with the correct person using two identifiers.  Location patient: Home Location provider: [facility]up[facility]ist, Office Persons participating in the virtual visit: Patient, Provider, [place] (Jess B)  I discussed the limitations of evaluation and management by telemedicine and the availability of in person appointments. The patient expressed understanding and agreed to proceed.  Subjective:   HPI:   DM- Last A1C 6.8.  UTD on eye exam, on ARB for renal protection.  Due for foot exam despite seeing Endo. No numbness/tingling of hands/feet.  No sore/wounds/lesions on feet.  HTN- chronic problem, on Losartan 50mg  daily.  Adequate control today.  Pt has officially retired- 'i'm in chill mode'.  No CP, SOB, HAs, visual changes, edema.  Hyperlipidemia- chronic problem, on Lipitor 10mg  daily.  Pt is exercising regularly- an hour on the treadmill. No abd pain, N/V.  ROS:   See pertinent positives and negatives per HPI.  Patient Active Problem List   Diagnosis Date Noted  . Benign paroxysmal positional vertigo 05/14/2018  . Bronchitis, asthmatic 11/22/2015  . Insomnia 05/31/2014  . GERD (gastroesophageal reflux disease) 11/02/2013  . Physical exam, annual 02/05/2011  . History of breast cancer 06/22/2008  . BEN NEOPLASM PANCREAS EXCEPT ISLETS LANGERHANS 06/22/2008  . ALLERGIC RHINITIS 06/22/2008  . Type 2 diabetes mellitus with hyperglycemia, with long-term current use of insulin ([place]) 02/24/2008  . HYPERCHOLESTEROLEMIA 02/24/2008  . ANXIETY 02/24/2008  . Essential hypertension 02/24/2008  . Asthma 02/24/2008    Social History   Tobacco Use  . Smoking status: Never Smoker  . Smokeless tobacco: Never Used  Substance Use Topics  . Alcohol use: Yes    Comment: social use    Current Outpatient Medications:  .  albuterol  (VENTOLIN HFA) 108 (90 Base) MCG/ACT inhaler, Inhale 2 puffs into the lungs every 6 (six) hours as needed for wheezing or shortness of breath., Disp: 1 Inhaler, Rfl: 2 .  ALPRAZolam (XANAX) 0.5 MG tablet, TAKE ONE TABLET BY MOUTH THREE TIMES DAILY AS NEEDED FOR SLEEP OR FOR ANXIETY, Disp: 90 tablet, Rfl: 0 .  aspirin 81 MG tablet, Take 81 mg by mouth daily.  , Disp: , Rfl:  .  atorvastatin (LIPITOR) 10 MG tablet, TAKE 1 TABLET DAILY, Disp: 90 tablet, Rfl: 1 .  BAYER MICROLET LANCETS lancets, Use 1-2x a DAY  - for [employer], Disp: 100 each, Rfl: 12 .  budesonide-formoterol (SYMBICORT) 80-4.5 MCG/ACT inhaler, Symbicort 80 mcg-4.5 mcg/actuation HFA aerosol inhaler  INHALE 2 PUFFS BY MOUTH TWICE DAILY, Disp: , Rfl:  .  Cholecalciferol (VITAMIN D) 2000 UNITS CAPS, Take 1 capsule by mouth daily.  , Disp: , Rfl:  .  Clocortolone Pivalate (CLODERM) 0.1 % cream, Cloderm 0.1 % topical cream, Disp: , Rfl:  .  fluocinonide-emollient (LIDEX-E) 0.05 % cream, Fluocinonide-E 0.05 % topical cream, Disp: , Rfl:  .  glucose blood test strip, Use 1-2x a day - Bayer Contour, Disp: 100 each, Rfl: 12 .  Insulin Glargine (BASAGLAR KWIKPEN) 100 UNIT/ML SOPN, Inject 0.06 mLs (6 Units total) into the skin daily., Disp: 5 pen, Rfl: 4 .  Insulin Pen Needle (CAREFINE PEN NEEDLES) 32G X 4 MM MISC, Use 1x a day, Disp: 100 each, Rfl: 3 .  losartan (COZAAR) 50 MG tablet, TAKE 1 TABLET DAILY, Disp: 90 tablet, Rfl: 0 .  meclizine (ANTIVERT) 25 MG tablet,  Take 1 tablet (25 mg total) by mouth 3 (three) times daily as needed for dizziness., Disp: 45 tablet, Rfl: 0 .  metFORMIN (GLUCOPHAGE-XR) 500 MG 24 hr tablet, TAKE 2 TABLETS 2x a day with meals, Disp: 360 tablet, Rfl: 3 .  montelukast (SINGULAIR) 10 MG tablet, TAKE 1 TABLET AT BEDTIME, Disp: 90 tablet, Rfl: 0 .  ONETOUCH DELICA LANCETS [id number] MISC, OneTouch Delica Lancets 33 gauge, Disp: , Rfl:  .  sitaGLIPtin (JANUVIA) 100 MG tablet, Take 1 tablet (100 mg total) by mouth daily.,  Disp: 30 tablet, Rfl: 11  Allergies  Allergen Reactions  . Influenza Vaccines Other (See Comments)    Unknown, allergic to egg whites  . Eggs Or Egg-Derived Products     Objective:   BP 128/84   Temp (!) 97.3 F (36.3 C) (Oral)   Ht 5\' 8"  (1.727 m)   Wt 155 lb (70.3 kg)   BMI 23.57 kg/m   AAOx3, NAD NCAT, EOMI No obvious CN deficits Coloring WNL Pt is able to speak clearly, coherently without shortness of breath or increased work of breathing.  Thought process is linear.  Mood is appropriate.   Assessment and Plan:   HTN- chronic problem, well controlled today.  Asymptomatic.  Applauded her efforts at regular exercise.  Check labs.  No anticipated med changes.  Will follow.  Hyperlipidemia- chronic problem.  Tolerating statin w/o difficulty.  Check labs.  Adjust meds prn   DM- chronic problem, following w/ Endo.  UTD on eye exam, on ARB for renal protection.  Unable to do foot exam virtually, will address at next OV.  Pt expressed understanding and is in agreement w/ plan.    Annye Asa, MD 11/01/2019

## 2019-11-07 ENCOUNTER — Ambulatory Visit: Payer: BC Managed Care – PPO

## 2019-11-21 ENCOUNTER — Other Ambulatory Visit: Payer: Self-pay

## 2019-11-21 ENCOUNTER — Ambulatory Visit (INDEPENDENT_AMBULATORY_CARE_PROVIDER_SITE_OTHER): Payer: BC Managed Care – PPO

## 2019-11-21 DIAGNOSIS — E1165 Type 2 diabetes mellitus with hyperglycemia: Secondary | ICD-10-CM

## 2019-11-21 DIAGNOSIS — Z794 Long term (current) use of insulin: Secondary | ICD-10-CM | POA: Diagnosis not present

## 2019-11-21 DIAGNOSIS — I10 Essential (primary) hypertension: Secondary | ICD-10-CM

## 2019-11-21 DIAGNOSIS — E7800 Pure hypercholesterolemia, unspecified: Secondary | ICD-10-CM | POA: Diagnosis not present

## 2019-11-21 LAB — BASIC METABOLIC PANEL
BUN: 20 mg/dL (ref 6–23)
CO2: 31 mEq/L (ref 19–32)
Calcium: 9.4 mg/dL (ref 8.4–10.5)
Chloride: 100 mEq/L (ref 96–112)
Creatinine, Ser: 0.8 mg/dL (ref 0.40–1.20)
GFR: 72.83 mL/min (ref >60.00)
Glucose, Bld: 116 mg/dL — ABNORMAL HIGH (ref 70–99)
Potassium: 4 mEq/L (ref 3.5–5.1)
Sodium: 140 mEq/L (ref 135–145)

## 2019-11-21 LAB — CBC WITH DIFFERENTIAL/PLATELET
Basophils Absolute: 0.1 10*3/uL (ref 0.0–0.1)
Basophils Relative: 1 % (ref 0.0–3.0)
Eosinophils Absolute: 0.3 10*3/uL (ref 0.0–0.7)
Eosinophils Relative: 4.9 % (ref 0.0–5.0)
HCT: 35.5 % — ABNORMAL LOW (ref 36.0–46.0)
Hemoglobin: 11.9 g/dL — ABNORMAL LOW (ref 12.0–15.0)
Lymphocytes Relative: 28.9 % (ref 12.0–46.0)
Lymphs Abs: 2 10*3/uL (ref 0.7–4.0)
MCHC: 33.6 g/dL (ref 30.0–36.0)
MCV: 88.1 fl (ref 78.0–100.0)
Monocytes Absolute: 0.7 10*3/uL (ref 0.1–1.0)
Monocytes Relative: 9.9 % (ref 3.0–12.0)
Neutro Abs: 3.8 10*3/uL (ref 1.4–7.7)
Neutrophils Relative %: 55.3 % (ref 43.0–77.0)
Platelets: 322 10*3/uL (ref 150.0–400.0)
RBC: 4.03 Mil/uL (ref 3.87–5.11)
RDW: 13.5 % (ref 11.5–15.5)
WBC: 6.9 10*3/uL (ref 4.0–10.5)

## 2019-11-21 LAB — HEPATIC FUNCTION PANEL
ALT: 11 U/L (ref 0–35)
AST: 15 U/L (ref 0–37)
Albumin: 4 g/dL (ref 3.5–5.2)
Alkaline Phosphatase: 72 U/L (ref 39–117)
Bilirubin, Direct: 0.1 mg/dL (ref 0.0–0.3)
Total Bilirubin: 0.5 mg/dL (ref 0.2–1.2)
Total Protein: 6.7 g/dL (ref 6.0–8.3)

## 2019-11-21 LAB — LIPID PANEL
Cholesterol: 149 mg/dL (ref 0–200)
HDL: 47.6 mg/dL (ref >39.00)
LDL Cholesterol: 82 mg/dL (ref 0–99)
NonHDL: 101.42
Total CHOL/HDL Ratio: 3
Triglycerides: 95 mg/dL (ref 0.0–149.0)
VLDL: 19 mg/dL (ref 0.0–40.0)

## 2019-11-21 LAB — TSH: TSH: 2.14 u[IU]/mL (ref 0.35–4.50)

## 2019-12-26 ENCOUNTER — Other Ambulatory Visit: Payer: Self-pay

## 2019-12-28 ENCOUNTER — Other Ambulatory Visit: Payer: Self-pay

## 2019-12-28 ENCOUNTER — Ambulatory Visit (INDEPENDENT_AMBULATORY_CARE_PROVIDER_SITE_OTHER): Payer: BC Managed Care – PPO | Admitting: Internal Medicine

## 2019-12-28 ENCOUNTER — Encounter: Payer: Self-pay | Admitting: Internal Medicine

## 2019-12-28 VITALS — BP 130/70 | HR 86 | Ht 68.0 in | Wt 155.0 lb

## 2019-12-28 DIAGNOSIS — E7800 Pure hypercholesterolemia, unspecified: Secondary | ICD-10-CM | POA: Diagnosis not present

## 2019-12-28 DIAGNOSIS — Z794 Long term (current) use of insulin: Secondary | ICD-10-CM | POA: Diagnosis not present

## 2019-12-28 DIAGNOSIS — E1165 Type 2 diabetes mellitus with hyperglycemia: Secondary | ICD-10-CM

## 2019-12-28 LAB — MICROALBUMIN / CREATININE URINE RATIO
Creatinine,U: 153.9 mg/dL
Microalb Creat Ratio: 6.2 mg/g (ref 0.0–30.0)
Microalb, Ur: 9.6 mg/dL — ABNORMAL HIGH (ref 0.0–1.9)

## 2019-12-28 LAB — POCT GLYCOSYLATED HEMOGLOBIN (HGB A1C): Hemoglobin A1C: 6.9 % — AB (ref 4.0–5.6)

## 2019-12-28 NOTE — Progress Notes (Signed)
Patient ID: Amber Washington, female   DOB: [birth year], 64 y.o.   MRN: [id number]   This visit occurred during the SARS-CoV-2 public health emergency.  Safety protocols were in place, including screening questions prior to the visit, additional usage of staff PPE, and extensive cleaning of exam room while observing appropriate contact time as indicated for disinfecting solutions.   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  Last visit 4 months ago.  Reviewed HbA1c levels: Lab Results  Component Value Date   HGBA1C 6.8 (A) 08/29/2019   HGBA1C 7.1 (A) 05/02/2019   HGBA1C 6.4 (A) 08/18/2018   She is on: - Januvia 100 mg before breakfast - Metformin ER 500 >> 1000 mg with breakfast and 1000 mg with dinner  - Lantus >> Basaglar 5 units daily at bedtime >> in a.m. >> 6 units in a.m. She was on Jardiance 25 mg before b'fast >> yeast inf's >> stopped.  Pt checks sugars 1-2 times a day: - am:  99-141, 156, 167 >> 105-154, 174 >> 111-155 >> 100-140, 151 - 2h after b'fast: 130 >> 126 >> 104 >> n/c >> 162 >> n/c - before lunch:  87, 163 >> 103-121, 177 >> 94, 100-144 >> 75-111, 122 - 2h after lunch:  80 , 101 >> n/c >> 174, 179 >> n/c - before dinner:  79, 80-159, 198 >> 115-173, 180 >> 106-179 - 2h after dinner:165, 194 >> n/c >> 130, 228 >> n/c - bedtime: 100-161, 233 >> n/c >> 129 >> n/c - nighttime:  got glu tablets >> n/c Lowest: 64 after exercise >> 79 after a walk >> 94 >> 75 Highest: 228 x1 >> 289 (05/2019) >> 179  Glucometer: Bayer Contour  Pt's meals are: - Breakfast: oatmeal + yoghurt + crackers - Lunch: salads + meat/cheese/crackers + fruit - Dinner: chicken + veggies or salad - Snacks: nuts , crackers, carrots - 1-x a day Drinks water only.  She continues to exercise by walking,   No CKD, last BUN/creatinine:  Lab  Results  Component Value Date   BUN 20 11/21/2019   BUN 20 05/03/2019   CREATININE 0.80 11/21/2019   CREATININE 0.78 05/03/2019   Normal ACR: Lab Results  Component Value Date   MICRALBCREAT 3.4 06/28/2012   MICRALBCREAT 2.3 01/27/2011   MICRALBCREAT 3.2 02/05/2010  On losartan.  + HL; last set of lipids: Lab Results  Component Value Date   CHOL 149 11/21/2019   HDL 47.60 11/21/2019   LDLCALC 82 11/21/2019   TRIG 95.0 11/21/2019   CHOLHDL 3 11/21/2019  On Lipitor.  - last eye exam was in 08/2019: No DR  - no  numbness and tingling in her feet.  She also has HTN.  She has chronic insomnia Xanax helps.  This was accentuated now in the setting of the coronavirus pandemic.  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  She usually hematuria on annual UA eval. By Harle Battiest.  ROS: Constitutional: no weight gain/no weight loss, no fatigue, no subjective hyperthermia, no subjective hypothermia Eyes: no blurry vision, no xerophthalmia ENT: no sore throat, no nodules palpated in neck, no dysphagia, no odynophagia, no hoarseness Cardiovascular: no CP/no SOB/no palpitations/no leg swelling Respiratory: no cough/no SOB/no wheezing Gastrointestinal: no N/no V/no D/no C/no acid reflux Musculoskeletal: no muscle aches/no joint  aches Skin: no rashes, no hair loss Neurological: no tremors/no numbness/no tingling/no dizziness  I reviewed pt's medications, allergies, PMH, social hx, family hx, and changes were documented in the history of present illness. Otherwise, unchanged from my initial visit note.  Past Medical History:  Diagnosis Date  . Allergic rhinitis   . Anxiety state, unspecified   . Asthma   . Benign neoplasm of pancreas, except islets of Langerhans   . Borderline hypertension   . Hypercholesteremia   . Malignant neoplasm of breast (female), unspecified site   . Osteoporosis, unspecified   . Personal history of  chemotherapy   . Personal history of radiation therapy   . Stenosis of nasolacrimal duct, acquired   . Type II or unspecified type diabetes mellitus without mention of complication, not stated as uncontrolled    Past Surgical History:  Procedure Laterality Date  . BREAST BIOPSY    . BREAST EXCISIONAL BIOPSY    . BREAST LUMPECTOMY     [id number] 1995  . 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 NASAL SEPTUM SURGERY    . right breast cancer sugery  1995  . surgery on neck for removal of birthmark    . tur induction     Social History   Social History  . Marital status: Married    Spouse name: N/A  . Number of children: 2   Occupational History  . Bookkeeper   Social History Main Topics  . Smoking status: Never Smoker  . Smokeless tobacco: Never Used  . Alcohol use No  . Drug use: No   Current Outpatient Medications on File Prior to Visit  Medication Sig Dispense Refill  . albuterol (VENTOLIN HFA) 108 (90 Base) MCG/ACT inhaler Inhale 2 puffs into the lungs every 6 (six) hours as needed for wheezing or shortness of breath. 1 Inhaler 2  . ALPRAZolam (XANAX) 0.5 MG tablet TAKE ONE TABLET BY MOUTH THREE TIMES DAILY AS NEEDED FOR SLEEP OR FOR ANXIETY 90 tablet 0  . aspirin 81 MG tablet Take 81 mg by mouth daily.      Marland Kitchen atorvastatin (LIPITOR) 10 MG tablet Take 1 tablet (10 mg total) by mouth daily. 90 tablet 1  . BAYER MICROLET LANCETS lancets Use 1-2x a DAY  - for Bayer Contour 100 each 12  . budesonide-formoterol (SYMBICORT) 80-4.5 MCG/ACT inhaler Symbicort 80 mcg-4.5 mcg/actuation HFA aerosol inhaler  INHALE 2 PUFFS BY MOUTH TWICE DAILY    . Cholecalciferol (VITAMIN D) 2000 UNITS CAPS Take 1 capsule by mouth daily.      . Clocortolone Pivalate (CLODERM) 0.1 % cream Cloderm 0.1 % topical cream    . fluocinonide-emollient (LIDEX-E) 0.05 % cream Fluocinonide-E 0.05 % topical cream    . glucose blood test strip Use 1-2x a day - Bayer Contour  100 each 12  . Insulin Glargine (BASAGLAR KWIKPEN) 100 UNIT/ML SOPN Inject 0.06 mLs (6 Units total) into the skin daily. 5 pen 4  . Insulin Pen Needle (CAREFINE PEN NEEDLES) 32G X 4 MM MISC Use 1x a day 100 each 3  . losartan (COZAAR) 50 MG tablet TAKE 1 TABLET DAILY 90 tablet 0  . meclizine (ANTIVERT) 25 MG tablet Take 1 tablet (25 mg total) by mouth 3 (three) times daily as needed for dizziness. 45 tablet 0  . metFORMIN (GLUCOPHAGE-XR) 500 MG 24 hr tablet TAKE 2 TABLETS 2x a day with meals 360 tablet 3  . montelukast (SINGULAIR) 10 MG tablet TAKE 1  TABLET AT BEDTIME 90 tablet 0  . ONETOUCH DELICA LANCETS 99991111 MISC OneTouch Delica Lancets 33 gauge    . sitaGLIPtin (JANUVIA) 100 MG tablet Take 1 tablet (100 mg total) by mouth daily. 30 tablet 11   No current facility-administered medications on file prior to visit.   Allergies  Allergen Reactions  . Influenza Vaccines Other (See Comments)    Unknown, allergic to egg whites  . Eggs Or Egg-Derived Products    FH: - mother - leukemia, father - liver cancer - mother - HTN - DM in nephew.  PE: BP 130/70   Pulse 86   Ht 5\' 8"  (1.727 m)   Wt 155 lb (70.3 kg)   SpO2 99%   BMI 23.57 kg/m  Wt Readings from Last 3 Encounters:  12/28/19 155 lb (70.3 kg)  11/01/19 155 lb (70.3 kg)  08/29/19 158 lb (71.7 kg)   Constitutional: normal weight, in NAD Eyes: PERRLA, EOMI, no exophthalmos ENT: moist mucous membranes, no thyromegaly, no cervical lymphadenopathy Cardiovascular: RRR, No MRG Respiratory: CTA B Gastrointestinal: abdomen soft, NT, ND, BS+ Musculoskeletal: no deformities, strength intact in all 4 Skin: moist, warm, no rashes Neurological: no tremor with outstretched hands, DTR normal in all 4  ASSESSMENT: 1. DM, insulin-dependent, uncontrolled, without long term complications, but with hyperglycemia  Component     Latest Ref Rng & Units 12/04/2016  C-Peptide     0.80 - 3.85 ng/mL 0.94  Glucose, Fasting     65 - 99 mg/dL 160  (H)  C-peptide is not very high, but is still in the normal range.   She has a history of distal pancreatic resection (?%) For a benign cyst and she developed diabetes soon after the surgery.  2. HL  PLAN:  1. Patient with history of diabetes due to distal pancreatectomy for pancreatic cyst.  Her sugars improved after adding a low-dose basal insulin.  At last visit she was taking 6 units of Basaglar in the morning.  We moved this from bedtime to avoid hypoglycemia during the night.  She also continues on Metformin and DPP 4 inhibitor.  At last visit, sugars were better, with an HbA1c of 6.8%.  Since that sugars were still slightly above target, we discussed about maybe increasing Metformin dose in the morning for increasing Basaglar slightly by 1 to 2 units. -At this visit, She tells me that she did actually increase the Metformin 1000 mg in the morning so she is not taking this dose twice a day.  Her sugars improved after the change and they are mostly at goal or occasionally in the 140s.  Rarely, she had higher blood sugars in the morning, and 150.  Before the she occasionally has higher blood sugar, 160 or 170, mostly after snacks in the afternoon.  For now, would not change her regimen but I did advise him to increase Basaglar to 7 units or even 8 units if she has more hyperglycemia. - I suggested to:  Patient Instructions  Please continue: - Metformin ER 1000 mg with b'fast and 1000 mg with dinner - Januvia  100 mg before b'fast - Basaglar 6 units in a.m.   Please return in 4 months with your sugar log.   - we checked her HbA1c: 6.9% (slightly higher) - advised to check sugars at different times of the day - 1x a day, rotating check times - advised for yearly eye exams >> she is UTD - will check an ACR today.  - return to  clinic in 4 months    2. HL -Of note, she has arcus cornealis -Reviewed latest lipid panel from 11/2019 and all fractions are at goal: Lab Results  Component Value  Date   CHOL 149 11/21/2019   HDL 47.60 11/21/2019   LDLCALC 82 11/21/2019   TRIG 95.0 11/21/2019   CHOLHDL 3 11/21/2019  -Continues Lipitor without side effects  Office Visit on 12/28/2019  Component Date Value Ref Range Status  . Microalb, Ur 12/28/2019 9.6* 0.0 - 1.9 mg/dL Final  . Creatinine,U 12/28/2019 153.9  mg/dL Final  . Microalb Creat Ratio 12/28/2019 6.2  0.0 - 30.0 mg/g Final  . Hemoglobin A1C 12/28/2019 6.9* 4.0 - 5.6 % Final   Normal ACR.  Philemon Kingdom, MD PhD [facility]

## 2019-12-28 NOTE — Patient Instructions (Signed)
Please continue: - Metformin ER 1000 mg with b'fast and 1000 mg with dinner - Januvia  100 mg before b'fast - Basaglar 6 units in a.m.   Please return in 4 months with your sugar log.

## 2020-01-02 ENCOUNTER — Other Ambulatory Visit: Payer: Self-pay | Admitting: Family Medicine

## 2020-01-05 ENCOUNTER — Encounter: Payer: Self-pay | Admitting: Family Medicine

## 2020-02-08 ENCOUNTER — Other Ambulatory Visit: Payer: Self-pay | Admitting: Family Medicine

## 2020-02-09 NOTE — Telephone Encounter (Signed)
Last OV 11/01/19 Alprazolam last filled 11/01/19 #90 with 0

## 2020-02-10 MED ORDER — ALPRAZOLAM 0.5 MG PO TABS: ORAL_TABLET | ORAL | 0 refills | Status: DC

## 2020-03-13 ENCOUNTER — Other Ambulatory Visit: Payer: Self-pay | Admitting: Family Medicine

## 2020-04-22 ENCOUNTER — Other Ambulatory Visit: Payer: Self-pay | Admitting: Family Medicine

## 2020-05-03 ENCOUNTER — Other Ambulatory Visit: Payer: Self-pay | Admitting: Family Medicine

## 2020-05-04 MED ORDER — ALPRAZOLAM 0.5 MG PO TABS: ORAL_TABLET | ORAL | 0 refills | Status: DC

## 2020-05-04 NOTE — Telephone Encounter (Signed)
Last OV 11/01/19 Alprazolam last filled 02/10/20 #90 with 0

## 2020-05-09 ENCOUNTER — Encounter: Payer: Self-pay | Admitting: General Practice

## 2020-05-09 ENCOUNTER — Other Ambulatory Visit: Payer: Self-pay | Admitting: Internal Medicine

## 2020-05-09 ENCOUNTER — Encounter: Payer: Self-pay | Admitting: Family Medicine

## 2020-05-09 ENCOUNTER — Ambulatory Visit (INDEPENDENT_AMBULATORY_CARE_PROVIDER_SITE_OTHER): Payer: BC Managed Care – PPO | Admitting: Internal Medicine

## 2020-05-09 ENCOUNTER — Ambulatory Visit (INDEPENDENT_AMBULATORY_CARE_PROVIDER_SITE_OTHER): Payer: BC Managed Care – PPO | Admitting: Family Medicine

## 2020-05-09 ENCOUNTER — Other Ambulatory Visit: Payer: Self-pay

## 2020-05-09 ENCOUNTER — Encounter: Payer: Self-pay | Admitting: Internal Medicine

## 2020-05-09 VITALS — BP 110/70 | HR 88 | Ht 68.0 in | Wt 153.0 lb

## 2020-05-09 VITALS — BP 118/75 | HR 78 | Temp 97.6°F | Resp 16 | Ht 68.0 in | Wt 153.1 lb

## 2020-05-09 DIAGNOSIS — Z23 Encounter for immunization: Secondary | ICD-10-CM | POA: Diagnosis not present

## 2020-05-09 DIAGNOSIS — Z0000 Encounter for general adult medical examination without abnormal findings: Secondary | ICD-10-CM

## 2020-05-09 DIAGNOSIS — E1165 Type 2 diabetes mellitus with hyperglycemia: Secondary | ICD-10-CM | POA: Diagnosis not present

## 2020-05-09 DIAGNOSIS — Z794 Long term (current) use of insulin: Secondary | ICD-10-CM

## 2020-05-09 DIAGNOSIS — I10 Essential (primary) hypertension: Secondary | ICD-10-CM

## 2020-05-09 DIAGNOSIS — E7800 Pure hypercholesterolemia, unspecified: Secondary | ICD-10-CM

## 2020-05-09 LAB — HEPATIC FUNCTION PANEL
ALT: 11 U/L (ref 0–35)
AST: 14 U/L (ref 0–37)
Albumin: 4.5 g/dL (ref 3.5–5.2)
Alkaline Phosphatase: 68 U/L (ref 39–117)
Bilirubin, Direct: 0.1 mg/dL (ref 0.0–0.3)
Total Bilirubin: 0.6 mg/dL (ref 0.2–1.2)
Total Protein: 7.1 g/dL (ref 6.0–8.3)

## 2020-05-09 LAB — BASIC METABOLIC PANEL
BUN: 29 mg/dL — ABNORMAL HIGH (ref 6–23)
CO2: 31 mEq/L (ref 19–32)
Calcium: 10.1 mg/dL (ref 8.4–10.5)
Chloride: 99 mEq/L (ref 96–112)
Creatinine, Ser: 0.86 mg/dL (ref 0.40–1.20)
GFR: 66.9 mL/min (ref >60.00)
Glucose, Bld: 122 mg/dL — ABNORMAL HIGH (ref 70–99)
Potassium: 4.3 mEq/L (ref 3.5–5.1)
Sodium: 139 mEq/L (ref 135–145)

## 2020-05-09 LAB — CBC WITH DIFFERENTIAL/PLATELET
Basophils Absolute: 0.1 10*3/uL (ref 0.0–0.1)
Basophils Relative: 1.2 % (ref 0.0–3.0)
Eosinophils Absolute: 0.4 10*3/uL (ref 0.0–0.7)
Eosinophils Relative: 5.1 % — ABNORMAL HIGH (ref 0.0–5.0)
HCT: 36.6 % (ref 36.0–46.0)
Hemoglobin: 12.1 g/dL (ref 12.0–15.0)
Lymphocytes Relative: 30.5 % (ref 12.0–46.0)
Lymphs Abs: 2.1 10*3/uL (ref 0.7–4.0)
MCHC: 33.2 g/dL (ref 30.0–36.0)
MCV: 88.4 fl (ref 78.0–100.0)
Monocytes Absolute: 0.8 10*3/uL (ref 0.1–1.0)
Monocytes Relative: 11.2 % (ref 3.0–12.0)
Neutro Abs: 3.6 10*3/uL (ref 1.4–7.7)
Neutrophils Relative %: 52 % (ref 43.0–77.0)
Platelets: 357 10*3/uL (ref 150.0–400.0)
RBC: 4.14 Mil/uL (ref 3.87–5.11)
RDW: 13.3 % (ref 11.5–15.5)
WBC: 6.8 10*3/uL (ref 4.0–10.5)

## 2020-05-09 LAB — LIPID PANEL
Cholesterol: 152 mg/dL (ref 0–200)
HDL: 48.8 mg/dL (ref >39.00)
LDL Cholesterol: 87 mg/dL (ref 0–99)
NonHDL: 102.92
Total CHOL/HDL Ratio: 3
Triglycerides: 82 mg/dL (ref 0.0–149.0)
VLDL: 16.4 mg/dL (ref 0.0–40.0)

## 2020-05-09 LAB — POCT GLYCOSYLATED HEMOGLOBIN (HGB A1C): Hemoglobin A1C: 6.7 % — AB (ref 4.0–5.6)

## 2020-05-09 LAB — TSH: TSH: 1.76 u[IU]/mL (ref 0.35–4.50)

## 2020-05-09 MED ORDER — BASAGLAR KWIKPEN 100 UNIT/ML ~~LOC~~ SOPN
7.0000 [IU] | PEN_INJECTOR | Freq: Every day | SUBCUTANEOUS | 5 refills | Status: DC
Start: 2020-05-09 — End: 2021-05-23

## 2020-05-09 MED ORDER — METFORMIN HCL ER 500 MG PO TB24
ORAL_TABLET | ORAL | 3 refills | Status: DC
Start: 2020-05-09 — End: 2020-07-03

## 2020-05-09 NOTE — Addendum Note (Signed)
Addended by: Cardell Peach I on: 05/09/2020 12:01 PM   Modules accepted: Orders

## 2020-05-09 NOTE — Assessment & Plan Note (Signed)
Pt's PE WNL.  UTD on pap, mammo, colonoscopy (due Oct), COVID, Tdap, Pneumovax.  Flu shot given today.  Check labs.  Anticipatory guidance provided.

## 2020-05-09 NOTE — Progress Notes (Signed)
   Subjective:    Patient ID: Amber Washington, female    DOB: June 24, 1956, 64 y.o.   MRN: [id number]  HPI CPE- UTD on mammo, pap, colonoscopy.  UTD on Tdap, Pneumovax, COVID.  Will get flu shot today.  Due for foot exam.  Reviewed past medical, surgical, family and social histories.   Health Maintenance  Topic Date Due  . FOOT EXAM  06/11/2019  . Hepatitis C Screening  10/31/2020 (Originally February 12, 1956)  . HIV Screening  10/31/2020 (Originally 07/05/1973)  . COLONOSCOPY  06/26/2020  . HEMOGLOBIN A1C  06/28/2020  . MAMMOGRAM  08/16/2020  . OPHTHALMOLOGY EXAM  08/30/2020  . PAP SMEAR-Modifier  06/19/2021  . TETANUS/TDAP  07/07/2022  . PNEUMOCOCCAL POLYSACCHARIDE VACCINE AGE 18-64 HIGH RISK  Completed  . COVID-19 Vaccine  Completed    Patient Care Team    Relationship Specialty Notifications Start End  Midge Minium, MD PCP - General Family Medicine  02/16/14   Richmond Campbell, MD Consulting Physician Gastroenterology  06/21/15   Key, Nelia Shi, NP Nurse Practitioner Gynecology  06/26/16   Clent Jacks, MD Consulting Physician Ophthalmology  06/26/16       Review of Systems Patient reports no vision/ hearing changes, adenopathy,fever, weight change,  persistant/recurrent hoarseness , swallowing issues, chest pain, palpitations, edema, persistant/recurrent cough, hemoptysis, dyspnea (rest/exertional/paroxysmal nocturnal), gastrointestinal bleeding (melena, rectal bleeding), abdominal pain, significant heartburn, GU symptoms (dysuria, hematuria, incontinence), Gyn symptoms (abnormal  bleeding, pain),  syncope, focal weakness, memory loss, numbness & tingling, skin/hair/nail changes, abnormal bruising or bleeding, anxiety, or depression.   + intermittent loose stools  This visit occurred during the SARS-CoV-2 public health emergency.  Safety protocols were in place, including screening questions prior to the visit, additional usage of staff PPE, and extensive cleaning of exam room  while observing appropriate contact time as indicated for disinfecting solutions.       Objective:   Physical Exam General Appearance:    Alert, cooperative, no distress, appears stated age  Head:    Normocephalic, without obvious abnormality, atraumatic  Eyes:    PERRL, conjunctiva/corneas clear, EOM's intact, fundi    benign, both eyes  Ears:    Normal TM's and external ear canals, both ears  Nose:   Deferred due to COVID  Throat:   Neck:   Supple, symmetrical, trachea midline, no adenopathy;    Thyroid: no enlargement/tenderness/nodules  Back:     Symmetric, no curvature, ROM normal, no CVA tenderness  Lungs:     Clear to auscultation bilaterally, respirations unlabored  Chest Wall:    No tenderness or deformity   Heart:    Regular rate and rhythm, S1 and S2 normal, no murmur, rub   or gallop  Breast Exam:    Deferred to GYN  Abdomen:     Soft, non-tender, bowel sounds active all four quadrants,    no masses, no organomegaly  Genitalia:    Deferred to GYN  Rectal:    Extremities:   Extremities normal, atraumatic, no cyanosis or edema  Pulses:   2+ and symmetric all extremities  Skin:   Skin color, texture, turgor normal, no rashes or lesions  Lymph nodes:   Cervical, supraclavicular, and axillary nodes normal  Neurologic:   CNII-XII intact, normal strength, sensation and reflexes    throughout          Assessment & Plan:

## 2020-05-09 NOTE — Progress Notes (Signed)
Patient ID: Amber Washington, female   DOB: 02-03-1956, 64 y.o.   MRN: [id number]   This visit occurred during the SARS-CoV-2 public health emergency.  Safety protocols were in place, including screening questions prior to the visit, additional usage of staff PPE, and extensive cleaning of exam room while observing appropriate contact time as indicated for disinfecting solutions.   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  Last visit 4 months ago.  Reviewed HbA1c levels: Lab Results  Component Value Date   HGBA1C 6.9 (A) 12/28/2019   HGBA1C 6.8 (A) 08/29/2019   HGBA1C 7.1 (A) 05/02/2019   She is on: - Januvia 100 mg before breakfast - Metformin ER 500 >> 1000 mg twice a day with meals - diarrhea after the increase in dose - Lantus >> Basaglar 5 units daily at bedtime >> in a.m. >> 6 >> 7 units in a.m. She was on Jardiance 25 mg before b'fast >> yeast inf's >> stopped.  Pt checks sugars 1-2 times a day - am:  105-154, 174 >> 111-155 >> 100-140, 151 >> 86-137, 142, 154 - 2h after b'fast: 130 >> 126 >> 104 >> n/c >> 162 >> n/c - before lunch: 94, 100-144 >> 75-111, 122 >> 71, 83-143, 149 - 2h after lunch:  80 , 101 >> n/c >> 174, 179 >> n/c - before dinner:  79, 80-159, 198 >> 115-173, 180 >> 106-179 >> 88-121 - 2h after dinner:165, 194 >> n/c >> 130, 228 >> n/c - bedtime: 100-161, 233 >> n/c >> 129 >> n/c - nighttime:  got glu tablets >> n/c Lowest: 64 after exercise >> 79 after a walk >> 94 >> 75 >> 71 Highest: 228 x1 >> 289 (05/2019) >> 179 >> 154  Glucometer: Bayer Contour  Pt's meals are: - Breakfast: oatmeal + yoghurt + crackers - Lunch: salads + meat/cheese/crackers + fruit - Dinner: chicken + veggies or salad - Snacks: nuts , crackers, carrots - 1-x a day Drinks water only.  She continues to exercise by  walking,   No CKD, last BUN/creatinine:  Lab Results  Component Value Date   BUN 20 11/21/2019   BUN 20 05/03/2019   CREATININE 0.80 11/21/2019   CREATININE 0.78 05/03/2019   Normal ACR: Lab Results  Component Value Date   MICRALBCREAT 6.2 12/28/2019   MICRALBCREAT 3.4 06/28/2012   MICRALBCREAT 2.3 01/27/2011   MICRALBCREAT 3.2 02/05/2010  On losartan.  + HL; last set of lipids: Lab Results  Component Value Date   CHOL 149 11/21/2019   HDL 47.60 11/21/2019   LDLCALC 82 11/21/2019   TRIG 95.0 11/21/2019   CHOLHDL 3 11/21/2019  On Lipitor.  - last eye exam was in 08/2019: No DR  -No numbness and tingling in her feet.  She also has HTN.  She has chronic insomnia Xanax helps.  This was accentuated now in the setting of the coronavirus pandemic.  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  She usually hematuria on annual UA eval. By Harle Battiest.  ROS: Constitutional: no weight gain/no weight loss, no fatigue, no subjective hyperthermia, no subjective hypothermia Eyes: no blurry vision, no xerophthalmia ENT: no sore throat, no nodules palpated in neck, no dysphagia, no odynophagia, no hoarseness Cardiovascular: no CP/no SOB/no palpitations/no leg swelling Respiratory: no cough/no SOB/no wheezing  Gastrointestinal: no N/no V/+ D/no C/no acid reflux Musculoskeletal: no muscle aches/no joint aches Skin: no rashes, no hair loss Neurological: no tremors/no numbness/no tingling/no dizziness  I reviewed pt's medications, allergies, PMH, social hx, family hx, and changes were documented in the history of present illness. Otherwise, unchanged from my initial visit note.  Past Medical History:  Diagnosis Date  . Allergic rhinitis   . Anxiety state, unspecified   . Asthma   . Benign neoplasm of pancreas, except islets of Langerhans   . Borderline hypertension   . Hypercholesteremia   . Malignant neoplasm of breast (female),  unspecified site   . Osteoporosis, unspecified   . Personal history of chemotherapy   . Personal history of radiation therapy   . Stenosis of nasolacrimal duct, acquired   . Type II or unspecified type diabetes mellitus without mention of complication, not stated as uncontrolled    Past Surgical History:  Procedure Laterality Date  . BREAST BIOPSY    . BREAST EXCISIONAL BIOPSY    . BREAST LUMPECTOMY     [id number] 1995  . 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 NASAL SEPTUM SURGERY    . right breast cancer sugery  1995  . surgery on neck for removal of birthmark    . tur induction     Social History   Social History  . Marital status: Married    Spouse name: N/A  . Number of children: 2   Occupational History  . Bookkeeper   Social History Main Topics  . Smoking status: Never Smoker  . Smokeless tobacco: Never Used  . Alcohol use No  . Drug use: No   Current Outpatient Medications on File Prior to Visit  Medication Sig Dispense Refill  . albuterol (VENTOLIN HFA) 108 (90 Base) MCG/ACT inhaler Inhale 2 puffs into the lungs every 6 (six) hours as needed for wheezing or shortness of breath. 1 Inhaler 2  . ALPRAZolam (XANAX) 0.5 MG tablet TAKE ONE TABLET BY MOUTH THREE TIMES DAILY AS NEEDED FOR SLEEP OR FOR ANXIETY 90 tablet 0  . aspirin 81 MG tablet Take 81 mg by mouth daily.      Marland Kitchen atorvastatin (LIPITOR) 10 MG tablet TAKE 1 TABLET DAILY 90 tablet 1  . BAYER MICROLET LANCETS lancets Use 1-2x a DAY  - for Bayer Contour 100 each 12  . budesonide-formoterol (SYMBICORT) 80-4.5 MCG/ACT inhaler Symbicort 80 mcg-4.5 mcg/actuation HFA aerosol inhaler  INHALE 2 PUFFS BY MOUTH TWICE DAILY    . Cholecalciferol (VITAMIN D) 2000 UNITS CAPS Take 1 capsule by mouth daily.      . Clocortolone Pivalate (CLODERM) 0.1 % cream Cloderm 0.1 % topical cream    . fluocinonide-emollient (LIDEX-E) 0.05 % cream Fluocinonide-E 0.05 % topical cream    . glucose  blood test strip Use 1-2x a day - Bayer Contour 100 each 12  . Insulin Glargine (BASAGLAR KWIKPEN) 100 UNIT/ML SOPN Inject 0.06 mLs (6 Units total) into the skin daily. 5 pen 4  . Insulin Pen Needle (CAREFINE PEN NEEDLES) 32G X 4 MM MISC Use 1x a day 100 each 3  . losartan (COZAAR) 50 MG tablet TAKE 1 TABLET DAILY 90 tablet 0  . meclizine (ANTIVERT) 25 MG tablet Take 1 tablet (25 mg total) by mouth 3 (three) times daily as needed for dizziness. 45 tablet 0  . metFORMIN (GLUCOPHAGE-XR) 500 MG 24 hr tablet TAKE 2 TABLETS 2x a day with meals 360 tablet 3  .  montelukast (SINGULAIR) 10 MG tablet TAKE 1 TABLET AT BEDTIME 90 tablet 0  . ONETOUCH DELICA LANCETS 29F MISC OneTouch Delica Lancets 33 gauge    . sitaGLIPtin (JANUVIA) 100 MG tablet Take 1 tablet (100 mg total) by mouth daily. 30 tablet 11   No current facility-administered medications on file prior to visit.   Allergies  Allergen Reactions  . Influenza Vaccines Other (See Comments)    Unknown, allergic to egg whites  . Eggs Or Egg-Derived Products    FH: - mother - leukemia, father - liver cancer - mother - HTN - DM in nephew.  PE: BP 110/70   Pulse 88   Ht 5\' 8"  (1.727 m)   Wt 153 lb (69.4 kg)   SpO2 97%   BMI 23.26 kg/m  Wt Readings from Last 3 Encounters:  05/09/20 153 lb (69.4 kg)  05/09/20 153 lb 2 oz (69.5 kg)  12/28/19 155 lb (70.3 kg)   Constitutional: Normal weight, in NAD Eyes: PERRLA, EOMI, no exophthalmos ENT: moist mucous membranes, no thyromegaly, no cervical lymphadenopathy Cardiovascular: RRR, No MRG Respiratory: CTA B Gastrointestinal: abdomen soft, NT, ND, BS+ Musculoskeletal: no deformities, strength intact in all 4 Skin: moist, warm, no rashes Neurological: no tremor with outstretched hands, DTR normal in all 4  ASSESSMENT: 1. DM, insulin-dependent, previously uncontrolled, without long term complications, but with hyperglycemia  Component     Latest Ref Rng & Units 12/04/2016  C-Peptide      0.80 - 3.85 ng/mL 0.94  Glucose, Fasting     65 - 99 mg/dL 160 (H)  C-peptide is not very high, but is still in the normal range.   She has a history of distal pancreatic resection (?%) For a benign cyst and she developed diabetes soon after the surgery.  2. HL  PLAN:  1. Patient with history of diabetes due to distal pancreatectomy for pancreatic cyst. Her sugars improved after adding a low-dose basal insulin. She also continues on Metformin and DPP 4 inhibitor. HbA1c at last visit was slightly higher, at 6.9%. At that time, sugars were mostly at goal, only occasionally in the 140s in the morning, as she was having occasional higher blood sugars, 160-170 mostly after snacks in the afternoon. We discussed about continuing the same regimen but to increase the Basaglar to 7 to 8 units if she had more hyperglycemia. -At today's visit, sugars appear improved compared to last visit with only few spikes in the mild hyperglycemic range.  She did increase her Basaglar from 6 to 7 units since last visit as she gets better results with this.  No lows.  However, she tells me that she gets diarrhea from 1000 mg of Metformin twice a day so at this visit I advised her to decrease the dose to only 1 tablet of Metformin ER in the morning and 2 at night or to stop the a.m. dose completely if the diarrhea does not resolve.  I also advised her that she may need a slightly higher dose of Basaglar after decreasing the Metformin dose.  For now, we will continue Januvia at the current dose.  I refilled her Basaglar. - I suggested to:  Patient Instructions  Please decrease: - Metformin ER 500 mg with b'fast and 1000 mg with dinner (may stop am dose if GI symptoms)  Please continue: - Januvia  100 mg before b'fast - Basaglar 7 units in a.m. (may need to increase to 8 units after decreasing Metformin)  Please return in 4  months with your sugar log.   - we checked her HbA1c: 6.7% (lower) - advised to check sugars at  different times of the day - 1x a day, rotating check times - advised for yearly eye exams >> she is UTD - at last visit, we repeated her ACR and this was normal - return to clinic in 4 months    2. HL -Of note, she has arcus cornealis -Reviewed latest lipid panel from 11/2019: All fractions at goal: Lab Results  Component Value Date   CHOL 149 11/21/2019   HDL 47.60 11/21/2019   LDLCALC 82 11/21/2019   TRIG 95.0 11/21/2019   CHOLHDL 3 11/21/2019  -Continues Lipitor without side effects   Philemon Kingdom, MD PhD [facility] Endocrinology

## 2020-05-09 NOTE — Assessment & Plan Note (Signed)
Chronic problem.  Well controlled today.  Asymptomatic.  Check labs.  No anticipated med changes. 

## 2020-05-09 NOTE — Patient Instructions (Signed)
Follow up in 6 months to recheck BP and cholesterol We'll notify you of your lab results and make any changes if needed Keep up the good work!  You look great! Call with any questions or concerns Stay Safe!  Stay Healthy!

## 2020-05-09 NOTE — Patient Instructions (Addendum)
Please decrease: - Metformin ER 500 mg with b'fast and 1000 mg with dinner (may stop am dose if GI symptoms)  Please continue: - Januvia  100 mg before b'fast - Basaglar 7 units in a.m. (may need to increase to 8 units after decreasing Metformin)  Please return in 4 months with your sugar log.

## 2020-05-09 NOTE — Addendum Note (Signed)
Addended by: Davis Gourd on: 05/09/2020 08:44 AM   Modules accepted: Orders

## 2020-07-03 ENCOUNTER — Other Ambulatory Visit: Payer: Self-pay | Admitting: Family Medicine

## 2020-07-03 ENCOUNTER — Other Ambulatory Visit: Payer: Self-pay | Admitting: Internal Medicine

## 2020-07-23 ENCOUNTER — Other Ambulatory Visit: Payer: Self-pay | Admitting: Family Medicine

## 2020-07-23 MED ORDER — ALPRAZOLAM 0.5 MG PO TABS
ORAL_TABLET | ORAL | 0 refills | Status: DC
Start: 2020-07-23 — End: 2020-10-17

## 2020-07-23 NOTE — Telephone Encounter (Signed)
LFD 05/04/20 #90 with no refills LOV 05/09/20 for physical NOV 11/07/20

## 2020-08-21 ENCOUNTER — Other Ambulatory Visit: Payer: Self-pay | Admitting: Gynecology

## 2020-08-21 DIAGNOSIS — Z1231 Encounter for screening mammogram for malignant neoplasm of breast: Secondary | ICD-10-CM

## 2020-09-10 ENCOUNTER — Encounter: Payer: Self-pay | Admitting: Internal Medicine

## 2020-09-10 ENCOUNTER — Ambulatory Visit (INDEPENDENT_AMBULATORY_CARE_PROVIDER_SITE_OTHER): Payer: BC Managed Care – PPO | Admitting: Internal Medicine

## 2020-09-10 ENCOUNTER — Other Ambulatory Visit: Payer: Self-pay

## 2020-09-10 VITALS — BP 122/82 | HR 81 | Ht 69.0 in | Wt 159.4 lb

## 2020-09-10 DIAGNOSIS — Z794 Long term (current) use of insulin: Secondary | ICD-10-CM

## 2020-09-10 DIAGNOSIS — E1165 Type 2 diabetes mellitus with hyperglycemia: Secondary | ICD-10-CM

## 2020-09-10 LAB — POCT GLYCOSYLATED HEMOGLOBIN (HGB A1C): Hemoglobin A1C: 7.4 % — AB (ref 4.0–5.6)

## 2020-09-10 NOTE — Progress Notes (Signed)
Patient ID: Amber Washington, female   DOB: [birth year], 65 y.o.   MRN: [id number]   This visit occurred during the SARS-CoV-2 public health emergency.  Safety protocols were in place, including screening questions prior to the visit, additional usage of staff PPE, and extensive cleaning of exam room while observing appropriate contact time as indicated for disinfecting solutions.   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  Last visit 4 months ago.  Reviewed HbA1c levels: Lab Results  Component Value Date   HGBA1C 6.7 (A) 05/09/2020   HGBA1C 6.9 (A) 12/28/2019   HGBA1C 6.8 (A) 08/29/2019   She is on: - Januvia 100 mg before breakfast - Metformin ER 500 >> 1000 mg twice a day with meals - diarrhea after the increase in dose >> 500 mg in a.m. and 1000 mg with dinner >> 1000 mg with dinner - Lantus >> Basaglar 5 units daily at bedtime >> in a.m. >> 6 >> 7 >> 8 units in a.m. She was on Jardiance 25 mg before b'fast >> yeast inf's >> stopped.  Pt checks sugars 1-2 times a day: - am:   100-140, 151 >> 86-137, 142, 154 >> 92-130, 142, 145 - 2h after b'fast: 104 >> n/c >> 162 >> n/c  - before lunch: 75-111, 122 >> 71, 83-143, 149 >> 71, 90-131, 136 - 2h after lunch:  80 , 101 >> n/c >> 174, 179 >> n/c - before dinner:  115-173, 180 >> 106-179 >> 88-121 >> 140, 148 - 2h after dinner:165, 194 >> n/c >> 130, 228 >> n/c - bedtime: 100-161, 233 >> n/c >> 129 >> n/c - nighttime:  got glu tablets >> n/c Lowest: 64 after exercise ...>> 71 >> 71 Highest:289 (05/2019) >> 179 >> 154 >> 148  Glucometer: Bayer Contour  Pt's meals are: - Breakfast: oatmeal + yoghurt + crackers - Lunch: salads + meat/cheese/crackers + fruit - Dinner: chicken + veggies or salad - Snacks: nuts , crackers, carrots - 1-x a day She only drinks water.  She  exercises by walking.  No CKD, last BUN/creatinine:  Lab Results  Component Value Date   BUN 29 (H) 05/09/2020   BUN 20 11/21/2019   CREATININE 0.86 05/09/2020   CREATININE 0.80 11/21/2019   Normal ACR: Lab Results  Component Value Date   MICRALBCREAT 6.2 12/28/2019   MICRALBCREAT 3.4 06/28/2012   MICRALBCREAT 2.3 01/27/2011   MICRALBCREAT 3.2 02/05/2010  On losartan.  + HL; last set of lipids: Lab Results  Component Value Date   CHOL 152 05/09/2020   HDL 48.80 05/09/2020   LDLCALC 87 05/09/2020   TRIG 82.0 05/09/2020   CHOLHDL 3 05/09/2020  On Lipitor.  - last eye exam was on 08/30/2020: No DR reportedly  -No she denies numbness and tingling in her feet.  She has a history of HTN, hematuria, chronic insomnia-Xanax helping. 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  ROS: Constitutional: no weight gain/no weight loss, no fatigue, no subjective hyperthermia, no subjective hypothermia Eyes: no blurry vision, no xerophthalmia ENT: no sore throat, no nodules palpated in neck, no dysphagia, no odynophagia, no hoarseness Cardiovascular: no CP/no SOB/no palpitations/no leg swelling Respiratory: no cough/no SOB/no wheezing Gastrointestinal: no N/no V/no D/no C/no acid reflux Musculoskeletal: no muscle aches/no joint aches Skin: no rashes,  no hair loss Neurological: no tremors/no numbness/no tingling/no dizziness  I reviewed pt's medications, allergies, PMH, social hx, family hx, and changes were documented in the history of present illness. Otherwise, unchanged from my initial visit note.  Past Medical History:  Diagnosis Date  . Allergic rhinitis   . Anxiety state, unspecified   . Asthma   . Benign neoplasm of pancreas, except islets of Langerhans   . Borderline hypertension   . Hypercholesteremia   . Malignant neoplasm of breast (female), unspecified site   . Osteoporosis, unspecified   . Personal history of  chemotherapy   . Personal history of radiation therapy   . Stenosis of nasolacrimal duct, acquired   . Type II or unspecified type diabetes mellitus without mention of complication, not stated as uncontrolled    Past Surgical History:  Procedure Laterality Date  . BREAST BIOPSY    . BREAST EXCISIONAL BIOPSY    . BREAST LUMPECTOMY     [id number] 1995  . 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 NASAL SEPTUM SURGERY    . right breast cancer sugery  1995  . surgery on neck for removal of birthmark    . tur induction     Social History   Social History  . Marital status: Married    Spouse name: N/A  . Number of children: 2   Occupational History  . Bookkeeper   Social History Main Topics  . Smoking status: Never Smoker  . Smokeless tobacco: Never Used  . Alcohol use No  . Drug use: No   Current Outpatient Medications on File Prior to Visit  Medication Sig Dispense Refill  . albuterol (VENTOLIN HFA) 108 (90 Base) MCG/ACT inhaler Inhale 2 puffs into the lungs every 6 (six) hours as needed for wheezing or shortness of breath. 1 Inhaler 2  . ALPRAZolam (XANAX) 0.5 MG tablet TAKE ONE TABLET BY MOUTH THREE TIMES DAILY AS NEEDED FOR SLEEP OR FOR ANXIETY 90 tablet 0  . aspirin 81 MG tablet Take 81 mg by mouth daily.      Marland Kitchen atorvastatin (LIPITOR) 10 MG tablet TAKE 1 TABLET DAILY 90 tablet 1  . BAYER MICROLET LANCETS lancets Use 1-2x a DAY  - for Bayer Contour 100 each 12  . budesonide-formoterol (SYMBICORT) 80-4.5 MCG/ACT inhaler Symbicort 80 mcg-4.5 mcg/actuation HFA aerosol inhaler  INHALE 2 PUFFS BY MOUTH TWICE DAILY    . Cholecalciferol (VITAMIN D) 2000 UNITS CAPS Take 1 capsule by mouth daily.      . Clocortolone Pivalate (CLODERM) 0.1 % cream Cloderm 0.1 % topical cream    . fluocinonide-emollient (LIDEX-E) 0.05 % cream Fluocinonide-E 0.05 % topical cream    . glucose blood test strip Use 1-2x a day - Bayer Contour 100 each 12  . Insulin  Glargine (BASAGLAR KWIKPEN) 100 UNIT/ML Inject 0.07-0.08 mLs (7-8 Units total) into the skin daily. 15 mL 5  . Insulin Pen Needle (CAREFINE PEN NEEDLES) 32G X 4 MM MISC Use 1x a day 100 each 3  . JANUVIA 100 MG tablet TAKE 1 TABLET BY MOUTH DAILY. 30 tablet 11  . losartan (COZAAR) 50 MG tablet TAKE 1 TABLET DAILY 90 tablet 0  . meclizine (ANTIVERT) 25 MG tablet Take 1 tablet (25 mg total) by mouth 3 (three) times daily as needed for dizziness. 45 tablet 0  . metFORMIN (GLUCOPHAGE-XR) 500 MG 24 hr tablet TAKE 2 TABLETS 2 TIMES     DAILY WITH MEALS 360 tablet 3  .  montelukast (SINGULAIR) 10 MG tablet TAKE 1 TABLET AT BEDTIME 90 tablet 0  . ONETOUCH DELICA LANCETS 99991111 MISC OneTouch Delica Lancets 33 gauge     No current facility-administered medications on file prior to visit.   Allergies  Allergen Reactions  . Influenza Vaccines Other (See Comments)    Unknown, allergic to egg whites  . Eggs Or Egg-Derived Products    FH: - mother - leukemia, father - liver cancer - mother - HTN - DM in nephew.  PE: Ht 5\' 9"  (1.753 m)   Wt 159 lb 6.4 oz (72.3 kg)   BMI 23.54 kg/m  122/82, pulse 81, O2 sat 97% Wt Readings from Last 3 Encounters:  09/10/20 159 lb 6.4 oz (72.3 kg)  05/09/20 153 lb (69.4 kg)  05/09/20 153 lb 2 oz (69.5 kg)   Constitutional: normal weight, in NAD Eyes: PERRLA, EOMI, no exophthalmos ENT: moist mucous membranes, no thyromegaly, no cervical lymphadenopathy Cardiovascular: RRR, No MRG Respiratory: CTA B Gastrointestinal: abdomen soft, NT, ND, BS+ Musculoskeletal: no deformities, strength intact in all 4 Skin: moist, warm, no rashes Neurological: no tremor with outstretched hands, DTR normal in all 4  ASSESSMENT: 1. DM, insulin-dependent, previously uncontrolled, without long term complications, but with hyperglycemia  Component     Latest Ref Rng & Units 12/04/2016  C-Peptide     0.80 - 3.85 ng/mL 0.94  Glucose, Fasting     65 - 99 mg/dL 160 (H)  C-peptide is  not very high, but is still in the normal range.   She has a history of distal pancreatic resection (?%) For a benign cyst and she developed diabetes soon after the surgery.  2. HL  PLAN:  1. Patient with history of diabetes due to distal pancreatectomy for pancreatic cyst.  Sugars improved after adding the low-dose basal insulin.  She is also on Metformin and DPP 4 inhibitor.  At last visit, HbA1c was better, at 6.7%.  At that time, she was having some GI side effects (diarrhea) from 1000 mg of Metformin twice a day, so we decreased it to only 1 tablet of Metformin ER in the morning and 2 tablets at night but discussed about possibly stopping the morning dose completely if symptoms persist.  I did advise her that she may need a slightly higher dose of Basaglar after the decrease in the Metformin dose. -At this visit, she tells me that she did decrease the dose of Metformin to 1000 mg with dinner with resolution of her GI symptoms.  She did increase the Basaglar to 8 units in the morning after the above change.  Sugars remained stable afterwards, mostly at goal.  Reviewing her blood sugar log, they are at goal in the morning and before lunch, but they were slightly higher before dinner.  However, she only has 2 blood sugar checks before dinner in the last 2 months, and I advised her to check more at this time and also after dinner. - we checked her HbA1c: 7.4% (higher), however, this is not reflected in her blood sugars at home so for now, I would not suggest a change in her regimen.  I did advise her to start checking blood sugars more before dinner and at bedtime and let me know if they are higher, before next visit - I suggested to:  Patient Instructions  Please continue: - Metformin ER 1000 mg with dinner - Januvia  100 mg before b'fast - Basaglar 8 units in a.m.  Please return in 4  months with your sugar log.   - advised to check sugars at different times of the day - 1x a day, rotating check  times - advised for yearly eye exams >> she is UTD - return to clinic in 4 months   2. 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

## 2020-09-10 NOTE — Patient Instructions (Addendum)
Please continue: - Metformin ER 1000 mg with dinner - Januvia  100 mg before b'fast - Basaglar 8 units in a.m.  Please return in 4 months with your sugar log.

## 2020-09-22 ENCOUNTER — Other Ambulatory Visit: Payer: Self-pay | Admitting: Family Medicine

## 2020-09-23 ENCOUNTER — Other Ambulatory Visit: Payer: Self-pay | Admitting: Family Medicine

## 2020-10-01 ENCOUNTER — Encounter: Payer: Self-pay | Admitting: Internal Medicine

## 2020-10-01 DIAGNOSIS — E1165 Type 2 diabetes mellitus with hyperglycemia: Secondary | ICD-10-CM

## 2020-10-01 DIAGNOSIS — Z794 Long term (current) use of insulin: Secondary | ICD-10-CM

## 2020-10-02 ENCOUNTER — Other Ambulatory Visit: Payer: Self-pay

## 2020-10-02 ENCOUNTER — Ambulatory Visit
Admission: RE | Admit: 2020-10-02 | Discharge: 2020-10-02 | Disposition: A | Discharge status: Home / Self Care | Payer: BC Managed Care – PPO | Source: Ambulatory Visit | Attending: Gynecology | Admitting: Gynecology

## 2020-10-02 ENCOUNTER — Other Ambulatory Visit: Payer: Self-pay | Admitting: Internal Medicine

## 2020-10-02 DIAGNOSIS — Z1231 Encounter for screening mammogram for malignant neoplasm of breast: Secondary | ICD-10-CM

## 2020-10-02 MED ORDER — CAREFINE PEN NEEDLES 32G X 4 MM MISC
3 refills | Status: DC
Start: 2020-10-02 — End: 2021-11-11

## 2020-10-02 MED ORDER — FIASP FLEXTOUCH 100 UNIT/ML ~~LOC~~ SOPN: 4.0000 [IU] | PEN_INJECTOR | Freq: Every day | SUBCUTANEOUS | 3 refills | Status: DC

## 2020-10-02 NOTE — Telephone Encounter (Signed)
Patient's pharmacy does not have this insulin, pharmacy called stating we would need to call in another one and asked which one Dr Cruzita Lederer recommended? Please advise.

## 2020-10-03 MED ORDER — INSULIN ASPART 100 UNIT/ML ~~LOC~~ SOLN: SUBCUTANEOUS | 3 refills | Status: DC

## 2020-10-17 ENCOUNTER — Other Ambulatory Visit: Payer: Self-pay | Admitting: Family Medicine

## 2020-10-17 MED ORDER — ALPRAZOLAM 0.5 MG PO TABS: ORAL_TABLET | ORAL | 0 refills | Status: DC

## 2020-10-17 NOTE — Telephone Encounter (Signed)
Xanax last rx 07/23/20 #90 LOV: 05/09/20 CPE

## 2020-10-25 LAB — HM PAP SMEAR

## 2020-11-07 ENCOUNTER — Ambulatory Visit: Payer: BC Managed Care – PPO | Admitting: Family Medicine

## 2020-11-07 ENCOUNTER — Encounter: Payer: Self-pay | Admitting: Family Medicine

## 2020-11-07 ENCOUNTER — Other Ambulatory Visit: Payer: Self-pay

## 2020-11-07 VITALS — BP 118/78 | HR 75 | Temp 97.7°F | Resp 20 | Ht 69.0 in | Wt 161.8 lb

## 2020-11-07 DIAGNOSIS — Z114 Encounter for screening for human immunodeficiency virus [HIV]: Secondary | ICD-10-CM

## 2020-11-07 DIAGNOSIS — I10 Essential (primary) hypertension: Secondary | ICD-10-CM | POA: Diagnosis not present

## 2020-11-07 DIAGNOSIS — Z1159 Encounter for screening for other viral diseases: Secondary | ICD-10-CM | POA: Diagnosis not present

## 2020-11-07 DIAGNOSIS — E7800 Pure hypercholesterolemia, unspecified: Secondary | ICD-10-CM

## 2020-11-07 LAB — CBC WITH DIFFERENTIAL/PLATELET
Basophils Absolute: 0.1 10*3/uL (ref 0.0–0.1)
Basophils Relative: 1.3 % (ref 0.0–3.0)
Eosinophils Absolute: 0.3 10*3/uL (ref 0.0–0.7)
Eosinophils Relative: 4.8 % (ref 0.0–5.0)
HCT: 35.6 % — ABNORMAL LOW (ref 36.0–46.0)
Hemoglobin: 11.9 g/dL — ABNORMAL LOW (ref 12.0–15.0)
Lymphocytes Relative: 34.4 % (ref 12.0–46.0)
Lymphs Abs: 2.1 10*3/uL (ref 0.7–4.0)
MCHC: 33.5 g/dL (ref 30.0–36.0)
MCV: 86.5 fl (ref 78.0–100.0)
Monocytes Absolute: 0.7 10*3/uL (ref 0.1–1.0)
Monocytes Relative: 11.3 % (ref 3.0–12.0)
Neutro Abs: 2.9 10*3/uL (ref 1.4–7.7)
Neutrophils Relative %: 48.2 % (ref 43.0–77.0)
Platelets: 327 10*3/uL (ref 150.0–400.0)
RBC: 4.11 Mil/uL (ref 3.87–5.11)
RDW: 13.7 % (ref 11.5–15.5)
WBC: 6.1 10*3/uL (ref 4.0–10.5)

## 2020-11-07 LAB — LIPID PANEL
Cholesterol: 128 mg/dL (ref 0–200)
HDL: 45.2 mg/dL (ref >39.00)
LDL Cholesterol: 65 mg/dL (ref 0–99)
NonHDL: 82.34
Total CHOL/HDL Ratio: 3
Triglycerides: 88 mg/dL (ref 0.0–149.0)
VLDL: 17.6 mg/dL (ref 0.0–40.0)

## 2020-11-07 LAB — BASIC METABOLIC PANEL
BUN: 19 mg/dL (ref 6–23)
CO2: 31 mEq/L (ref 19–32)
Calcium: 9.2 mg/dL (ref 8.4–10.5)
Chloride: 103 mEq/L (ref 96–112)
Creatinine, Ser: 0.82 mg/dL (ref 0.40–1.20)
GFR: 76.7 mL/min (ref >60.00)
Glucose, Bld: 94 mg/dL (ref 70–99)
Potassium: 3.6 mEq/L (ref 3.5–5.1)
Sodium: 138 mEq/L (ref 135–145)

## 2020-11-07 LAB — HEPATIC FUNCTION PANEL
ALT: 13 U/L (ref 0–35)
AST: 17 U/L (ref 0–37)
Albumin: 4 g/dL (ref 3.5–5.2)
Alkaline Phosphatase: 67 U/L (ref 39–117)
Bilirubin, Direct: 0.1 mg/dL (ref 0.0–0.3)
Total Bilirubin: 0.3 mg/dL (ref 0.2–1.2)
Total Protein: 7.1 g/dL (ref 6.0–8.3)

## 2020-11-07 NOTE — Patient Instructions (Signed)
Schedule your complete physical in 6 months We'll notify you of your lab results and make any changes if needed Keep up the good work on healthy diet and regular exercise- you're doing great! Call with any questions or concerns Stay Safe!  Stay Healthy! 

## 2020-11-07 NOTE — Assessment & Plan Note (Signed)
Chronic problem.  On Lipitor 10mg  daily.  Hx of good control.  Pt is exercising regularly.  Check labs.  Adjust meds prn

## 2020-11-07 NOTE — Progress Notes (Signed)
   Subjective:    Patient ID: Amber Washington, female    DOB: 04-Jun-1956, 65 y.o.   MRN: [id number]  HPI Hyperlipidemia- chronic problem, on Lipitor 10mg  daily.  No abd pain, N/V.  Continues to walk regularly.  HTN- chronic problem, on Losartan 50mg  daily w/ good control.  Pt reports feeling good.  No CP, SOB, HAs, visual changes, edema.   Review of Systems For ROS see HPI   This visit occurred during the SARS-CoV-2 public health emergency.  Safety protocols were in place, including screening questions prior to the visit, additional usage of staff PPE, and extensive cleaning of exam room while observing appropriate contact time as indicated for disinfecting solutions.       Objective:   Physical Exam Vitals reviewed.  Constitutional:      General: She is not in acute distress.    Appearance: Normal appearance. She is well-developed and well-nourished.  HENT:     Head: Normocephalic and atraumatic.  Eyes:     Extraocular Movements: EOM normal.     Conjunctiva/sclera: Conjunctivae normal.     Pupils: Pupils are equal, round, and reactive to light.  Neck:     Thyroid: No thyromegaly.  Cardiovascular:     Rate and Rhythm: Normal rate and regular rhythm.     Pulses: Normal pulses and intact distal pulses.     Heart sounds: Normal heart sounds. No murmur heard.   Pulmonary:     Effort: Pulmonary effort is normal. No respiratory distress.     Breath sounds: Normal breath sounds.  Abdominal:     General: There is no distension.     Palpations: Abdomen is soft.     Tenderness: There is no abdominal tenderness.  Musculoskeletal:        General: No edema.     Cervical back: Normal range of motion and neck supple.     Right lower leg: No edema.     Left lower leg: No edema.  Lymphadenopathy:     Cervical: No cervical adenopathy.  Skin:    General: Skin is warm and dry.  Neurological:     Mental Status: She is alert and oriented to person, place, and time.  Psychiatric:         Mood and Affect: Mood and affect normal.        Behavior: Behavior normal.           Assessment & Plan:

## 2020-11-07 NOTE — Assessment & Plan Note (Signed)
Chronic problem.  Well controlled on Losartan 50mg  daily.  Pt is feeling good and currently asymptomatic.  Check labs.  No anticipated med changes.  Will follow.

## 2020-11-08 LAB — HIV ANTIBODY (ROUTINE TESTING W REFLEX): HIV 1&2 Ab, 4th Generation: NONREACTIVE

## 2020-11-08 LAB — HEPATITIS C ANTIBODY
Hepatitis C Ab: NONREACTIVE
SIGNAL TO CUT-OFF: 0.01 (ref <1.00)

## 2020-11-08 LAB — TSH: TSH: 1.86 u[IU]/mL (ref 0.35–4.50)

## 2020-12-30 ENCOUNTER — Other Ambulatory Visit: Payer: Self-pay | Admitting: Family Medicine

## 2020-12-31 MED ORDER — LOSARTAN POTASSIUM 50 MG PO TABS: 1.0000 | ORAL_TABLET | Freq: Every day | ORAL | 0 refills | Status: DC

## 2020-12-31 MED ORDER — ALPRAZOLAM 0.5 MG PO TABS: ORAL_TABLET | ORAL | 0 refills | Status: DC

## 2020-12-31 MED ORDER — ALPRAZOLAM 0.5 MG PO TABS: ORAL_TABLET | ORAL | 1 refills | Status: DC

## 2020-12-31 MED ORDER — MONTELUKAST SODIUM 10 MG PO TABS: 1.0000 | ORAL_TABLET | Freq: Every day | ORAL | 0 refills | Status: DC

## 2020-12-31 NOTE — Telephone Encounter (Signed)
Requesting:Xanax 0.5mg  Contract: UDS: Last Visit:11/07/20 Next Visit:05/16/21 Last Refill:12/31/20 Rx printed 90 tabs 0 refills  Please Advise

## 2020-12-31 NOTE — Addendum Note (Signed)
Addended by: Midge Minium on: 12/31/2020 10:46 AM   Modules accepted: Orders

## 2021-01-09 ENCOUNTER — Encounter: Payer: Self-pay | Admitting: Internal Medicine

## 2021-01-09 ENCOUNTER — Other Ambulatory Visit: Payer: Self-pay

## 2021-01-09 ENCOUNTER — Ambulatory Visit (INDEPENDENT_AMBULATORY_CARE_PROVIDER_SITE_OTHER): Payer: BC Managed Care – PPO | Admitting: Internal Medicine

## 2021-01-09 VITALS — BP 128/82 | HR 82 | Ht 69.0 in | Wt 160.0 lb

## 2021-01-09 DIAGNOSIS — Z794 Long term (current) use of insulin: Secondary | ICD-10-CM | POA: Diagnosis not present

## 2021-01-09 DIAGNOSIS — E1165 Type 2 diabetes mellitus with hyperglycemia: Secondary | ICD-10-CM | POA: Diagnosis not present

## 2021-01-09 DIAGNOSIS — E7800 Pure hypercholesterolemia, unspecified: Secondary | ICD-10-CM

## 2021-01-09 LAB — POCT GLYCOSYLATED HEMOGLOBIN (HGB A1C): Hemoglobin A1C: 6.9 % — AB (ref 4.0–5.6)

## 2021-01-09 MED ORDER — FREESTYLE LIBRE 2 SENSOR MISC: 1.0000 | 3 refills | Status: DC

## 2021-01-09 MED ORDER — FREESTYLE LIBRE 2 READER DEVI
1.0000 | Freq: Every day | 0 refills | Status: DC
Start: 2021-01-09 — End: 2021-05-23

## 2021-01-09 NOTE — Progress Notes (Signed)
Patient ID: Amber Washington, female   DOB: 04/27/1956, 65 y.o.   MRN: [id number]   This visit occurred during the SARS-CoV-2 public health emergency.  Safety protocols were in place, including screening questions prior to the visit, additional usage of staff PPE, and extensive cleaning of exam room while observing appropriate contact time as indicated for disinfecting solutions.   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  Last visit 4 months ago.  Interim history: No increased urination, blurry vision, nausea. She is preparing to go see her son and her 63-month-old grandson in [place].  Reviewed HbA1c levels: Lab Results  Component Value Date   HGBA1C 7.4 (A) 09/10/2020   HGBA1C 6.7 (A) 05/09/2020   HGBA1C 6.9 (A) 12/28/2019   She is on: - Januvia 100 mg before breakfast - Metformin ER 500 >> 1000 mg twice a day with meals - diarrhea after the increase in dose >> 500 mg in a.m. and 1000 mg with dinner >> 1000 mg with dinner - Lantus >> Basaglar 5 units daily at bedtime >> in a.m. >> 6 >> 7 >> 8 units in a.m. - Novolog -mostly 2-3 units -started 09/2020 She was on Jardiance 25 mg before b'fast >> yeast inf's >> stopped.  Pt checks sugars 2-3 times a day - am: 86-137, 142, 154 >> 92-130, 142, 145 >> 88-126, 134 - 2h after b'fast: 104 >> n/c >> 162 >> n/c >> 217 - before lunch: 71, 83-143, 149 >> 71, 90-131, 136 >> 76-129 - 2h after lunch:  80 , 101 >> n/c >> 174, 179 >> n/c >> n/c - before dinner: 88-121 >> 140, 148 >> 128-151 >> 128-154 - 2h after dinner: 130, 228 >> n/c >> 61-161, 175 - bedtime: 100-161, 233 >> n/c >> 129 >> n/c >> 157, 160, 201 - nighttime:  got glu tablets >> n/c Lowest: 64 after exercise ...>> 71 >> 71 >> 55. Highest:289 (05/2019) >> 179 >> 154 >> 148 >> 217  Glucometer: Bayer Contour  Pt's  meals are: - Breakfast: oatmeal + yoghurt + crackers - Lunch: salads + meat/cheese/crackers + fruit - Dinner: chicken + veggies or salad - Snacks: nuts , crackers, carrots - 1-x a day She only drinks water.  She exercises by walking.  No CKD, last BUN/creatinine:  Lab Results  Component Value Date   BUN 19 11/07/2020   BUN 29 (H) 05/09/2020   CREATININE 0.82 11/07/2020   CREATININE 0.86 05/09/2020   Normal ACR: Lab Results  Component Value Date   MICRALBCREAT 6.2 12/28/2019   MICRALBCREAT 3.4 06/28/2012   MICRALBCREAT 2.3 01/27/2011   MICRALBCREAT 3.2 02/05/2010  On losartan.  + HL; last set of lipids: Lab Results  Component Value Date   CHOL 128 11/07/2020   HDL 45.20 11/07/2020   LDLCALC 65 11/07/2020   TRIG 88.0 11/07/2020   CHOLHDL 3 11/07/2020  On Lipitor.  - last eye exam was on 08/30/2020: No DR reportedly  -No she denies numbness and tingling in her feet.  She has a history of HTN, hematuria, chronic insomnia-Xanax helping.  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  ROS: Constitutional: no weight gain/no weight loss, no fatigue, no subjective hyperthermia, no subjective hypothermia Eyes: no blurry vision, no xerophthalmia ENT: no sore throat, no nodules palpated in  neck, no dysphagia, no odynophagia, no hoarseness Cardiovascular: no CP/no SOB/no palpitations/no leg swelling Respiratory: no cough/no SOB/no wheezing Gastrointestinal: no N/no V/no D/no C/no acid reflux Musculoskeletal: no muscle aches/no joint aches Skin: no rashes, no hair loss Neurological: no tremors/no numbness/no tingling/no dizziness  I reviewed pt's medications, allergies, PMH, social hx, family hx, and changes were documented in the history of present illness. Otherwise, unchanged from my initial visit note.  Past Medical History:  Diagnosis Date  . Allergic rhinitis   . Anxiety state, unspecified   . Asthma   . Benign  neoplasm of pancreas, except islets of Langerhans   . Borderline hypertension   . Hypercholesteremia   . Malignant neoplasm of breast (female), unspecified site   . Osteoporosis, unspecified   . Personal history of chemotherapy   . Personal history of radiation therapy   . Stenosis of nasolacrimal duct, acquired   . Type II or unspecified type diabetes mellitus without mention of complication, not stated as uncontrolled    Past Surgical History:  Procedure Laterality Date  . BREAST BIOPSY    . BREAST EXCISIONAL BIOPSY    . BREAST LUMPECTOMY     [id number] 1995  . 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 NASAL SEPTUM SURGERY    . right breast cancer sugery  1995  . surgery on neck for removal of birthmark    . tur induction     Social History   Social History  . Marital status: Married    Spouse name: N/A  . Number of children: 2   Occupational History  . Bookkeeper   Social History Main Topics  . Smoking status: Never Smoker  . Smokeless tobacco: Never Used  . Alcohol use No  . Drug use: No   Current Outpatient Medications on File Prior to Visit  Medication Sig Dispense Refill  . albuterol (VENTOLIN HFA) 108 (90 Base) MCG/ACT inhaler Inhale 2 puffs into the lungs every 6 (six) hours as needed for wheezing or shortness of breath. 1 Inhaler 2  . ALPRAZolam (XANAX) 0.5 MG tablet TAKE ONE TABLET BY MOUTH THREE TIMES DAILY AS NEEDED FOR SLEEP OR FOR ANXIETY 90 tablet 1  . aspirin 81 MG tablet Take 81 mg by mouth daily.    Marland Kitchen atorvastatin (LIPITOR) 10 MG tablet TAKE 1 TABLET DAILY 90 tablet 1  . BAYER MICROLET LANCETS lancets Use 1-2x a DAY  - for Bayer Contour 100 each 12  . budesonide-formoterol (SYMBICORT) 80-4.5 MCG/ACT inhaler Symbicort 80 mcg-4.5 mcg/actuation HFA aerosol inhaler  INHALE 2 PUFFS BY MOUTH TWICE DAILY    . Cholecalciferol (VITAMIN D) 2000 UNITS CAPS Take 1 capsule by mouth daily.    . Clocortolone Pivalate (CLODERM)  0.1 % cream Cloderm 0.1 % topical cream (Patient not taking: Reported on 11/07/2020)    . fluocinonide-emollient (LIDEX-E) 0.05 % cream Fluocinonide-E 0.05 % topical cream (Patient not taking: Reported on 11/07/2020)    . glucose blood test strip Use 1-2x a day - Bayer Contour 100 each 12  . insulin aspart (NOVOLOG FLEXPEN) 100 UNIT/ML FlexPen Novolog Flexpen U-100 Insulin aspart 100 unit/mL (3 mL) subcutaneous  INJECT 4 TO 6 UNITS INTO THE SKIN BEFORE DINNER    . insulin aspart (NOVOLOG) 100 UNIT/ML injection Inject 4-6 units into the skin before dinner 15 mL 3  . Insulin Glargine (BASAGLAR KWIKPEN) 100 UNIT/ML Inject 0.07-0.08 mLs (7-8 Units total) into the skin daily. 15 mL 5  . Insulin Pen  Needle (CAREFINE PEN NEEDLES) 32G X 4 MM MISC Use 2-3x a day 200 each 3  . JANUVIA 100 MG tablet TAKE 1 TABLET BY MOUTH DAILY. 30 tablet 11  . losartan (COZAAR) 50 MG tablet Take 1 tablet (50 mg total) by mouth daily. 90 tablet 0  . meclizine (ANTIVERT) 25 MG tablet Take 1 tablet (25 mg total) by mouth 3 (three) times daily as needed for dizziness. (Patient not taking: Reported on 11/07/2020) 45 tablet 0  . metFORMIN (GLUCOPHAGE-XR) 500 MG 24 hr tablet TAKE 2 TABLETS 2 TIMES     DAILY WITH MEALS (Patient taking differently: Patient reports taking 1xdaily) 360 tablet 3  . montelukast (SINGULAIR) 10 MG tablet Take 1 tablet (10 mg total) by mouth at bedtime. 90 tablet 0  . NOVOLOG FLEXPEN 100 UNIT/ML FlexPen SMARTSIG:4-6 Unit(s) SUB-Q Every Evening    . ONETOUCH DELICA LANCETS 37T MISC OneTouch Delica Lancets 33 gauge     No current facility-administered medications on file prior to visit.   Allergies  Allergen Reactions  . Influenza Vaccines Other (See Comments)    Unknown, allergic to egg whites  . Eggs Or Egg-Derived Products    FH: - mother - leukemia, father - liver cancer - mother - HTN - DM in nephew.  PE: BP 128/82 (BP Location: Right Arm, Patient Position: Sitting, Cuff Size: Normal)   Pulse 82    Ht 5\' 9"  (1.753 m)   Wt 160 lb (72.6 kg)   SpO2 97%   BMI 23.63 kg/m   Wt Readings from Last 3 Encounters:  01/09/21 160 lb (72.6 kg)  11/07/20 161 lb 12.8 oz (73.4 kg)  09/10/20 159 lb 6.4 oz (72.3 kg)   Constitutional: normal weight, in NAD Eyes: PERRLA, EOMI, no exophthalmos, + arcus cornea ENT: moist mucous membranes, no thyromegaly, no cervical lymphadenopathy Cardiovascular: RRR, No MRG Respiratory: CTA B Gastrointestinal: abdomen soft, NT, ND, BS+ Musculoskeletal: no deformities, strength intact in all 4 Skin: moist, warm, no rashes Neurological: no tremor with outstretched hands, DTR normal in all 4  ASSESSMENT: 1. DM, insulin-dependent, previously uncontrolled, without long term complications, but with hyperglycemia  Component     Latest Ref Rng & Units 12/04/2016  C-Peptide     0.80 - 3.85 ng/mL 0.94  Glucose, Fasting     65 - 99 mg/dL 160 (H)  C-peptide is not very high, but is still in the normal range.   She has a history of distal pancreatic resection (?%) For a benign cyst and she developed diabetes soon after the surgery.  2. HL  PLAN:  1. Patient with history of diabetes due to distal pancreatectomy for pancreatic cyst.  Sugars improved after adding low-dose basal insulin but worsened after our last visit.  We had to start rapid acting insulin, NovoLog, before dinner, at that time.  She is also continuing on metformin and Januvia.  HbA1c at last visit was 7.4%, higher.  At that time, I advised her to check more sugars in the second half of the day. - at this visit, sugars appear improved, but she does have some low blood sugars especially after dinner.  She initially started NovoLog at 2 to 5 units, but she noticed that her sugars drop on the higher dose just she is now alternating between 2 and 3 units depending on the meal and the blood sugars before the meal.  She recently checked a blood sugar after breakfast and this was quite high, at 217, after her  regular  breakfast, oatmeal.  I advised her to check this more frequently.  If sugars remain high, to add 2 to 3 units of NovoLog before this meal, also.  However, she mentions an episode of diaphoresis and feeling poorly when walking, which improved with current administration.  Therefore, I advised her not to bolus insulin if she plans to walk after a meal.  We also discussed about possibly adding this dose of NovoLog before lunch if she plans to have more carbs.  She is now not checking sugars after lunch, but the values before dinner are mostly at the upper limit of the target interval or above -At today's visit I suggested a CGM.  She is reticent to start this as she feels overwhelmed with all the changes that we had to do in her regimen, but I demonstrated how this works and printed her a prescription for the freestyle libre 2 CGM, to fill at her convenience, after she looks it up - I suggested to:  Patient Instructions  Please continue: - Metformin ER 1000 mg with dinner - Januvia 100 mg before b'fast - Basaglar 8 units in a.m. - NovoLog 2-3 units before dinner and as needed before the rest of the meals (more starches/carbs, or eating out - but try not to take it if you plan to be active after the meal).   Check some more sugars after b'fast.  Please return in 4 months with your sugar log.   - we checked her HbA1c: 6.9% (better) - advised to check sugars at different times of the day - 4x a day, rotating check times - advised for yearly eye exams >> she is UTD - return to clinic in 4 months  2. HL -She has arcus cornea -Reviewed latest lipid panel from 11/2020: All fractions at goal: Lab Results  Component Value Date   CHOL 128 11/07/2020   HDL 45.20 11/07/2020   LDLCALC 65 11/07/2020   TRIG 88.0 11/07/2020   CHOLHDL 3 11/07/2020  -She continues on Lipitor 10 mg daily without side effects   Philemon Kingdom, MD PhD [facility] Endocr[facility]

## 2021-01-09 NOTE — Patient Instructions (Addendum)
Please continue: - Metformin ER 1000 mg with dinner - Januvia 100 mg before b'fast - Basaglar 8 units in a.m. - NovoLog 2-3 units before dinner and as needed before the rest of the meals (more starches/carbs, or eating out - but try not to take it if you plan to be active after the meal).   Check some more sugars after b'fast.  Please return in 4 months with your sugar log.

## 2021-01-11 ENCOUNTER — Encounter: Payer: Self-pay | Admitting: Internal Medicine

## 2021-01-11 DIAGNOSIS — Z794 Long term (current) use of insulin: Secondary | ICD-10-CM

## 2021-01-11 DIAGNOSIS — E1165 Type 2 diabetes mellitus with hyperglycemia: Secondary | ICD-10-CM

## 2021-01-24 ENCOUNTER — Other Ambulatory Visit: Payer: Self-pay | Admitting: *Deleted

## 2021-01-29 MED ORDER — GLUCOSE BLOOD VI STRP: ORAL_STRIP | 12 refills | Status: DC

## 2021-02-05 ENCOUNTER — Encounter: Payer: Self-pay | Admitting: Registered Nurse

## 2021-02-05 ENCOUNTER — Telehealth (INDEPENDENT_AMBULATORY_CARE_PROVIDER_SITE_OTHER): Payer: BC Managed Care – PPO | Admitting: Registered Nurse

## 2021-02-05 ENCOUNTER — Other Ambulatory Visit: Payer: Self-pay

## 2021-02-05 VITALS — Wt 158.0 lb

## 2021-02-05 DIAGNOSIS — J22 Unspecified acute lower respiratory infection: Secondary | ICD-10-CM

## 2021-02-05 DIAGNOSIS — R058 Other specified cough: Secondary | ICD-10-CM

## 2021-02-05 DIAGNOSIS — R059 Cough, unspecified: Secondary | ICD-10-CM

## 2021-02-05 MED ORDER — AZITHROMYCIN 250 MG PO TABS: ORAL_TABLET | ORAL | 0 refills | Status: AC

## 2021-02-05 MED ORDER — BENZONATATE 100 MG PO CAPS
100.0000 mg | ORAL_CAPSULE | Freq: Two times a day (BID) | ORAL | 0 refills | Status: DC | PRN
Start: 2021-02-05 — End: 2021-05-23

## 2021-02-05 MED ORDER — PREDNISONE 10 MG PO TABS: 10.0000 mg | ORAL_TABLET | Freq: Every day | ORAL | 0 refills | Status: DC

## 2021-02-05 MED ORDER — HYDROCODONE BIT-HOMATROP MBR 5-1.5 MG/5ML PO SOLN
5.0000 mL | Freq: Every evening | ORAL | 0 refills | Status: DC | PRN
Start: 2021-02-05 — End: 2021-05-23

## 2021-02-05 NOTE — Progress Notes (Signed)
Telemedicine Encounter- SOAP NOTE Established Patient  This telephone encounter was conducted with the patient's (or proxy's) verbal consent via audio telecommunications: yes  Patient was instructed to have this encounter in a suitably private space; and to only have persons present to whom they give permission to participate. In addition, patient identity was confirmed by use of name plus two identifiers (DOB and address).  I discussed the limitations, risks, security and privacy concerns of performing an evaluation and management service by telephone and the availability of in person appointments. I also discussed with the patient that there may be a patient responsible charge related to this service. The patient expressed understanding and agreed to proceed.  I spent a total of 13 minutes talking with the patient or their proxy.  Patient at home Provider in office  Participants: Kathrin Ruddy, NP and Gypsy Decant  Chief Complaint  Patient presents with  . Cough    Patient states she has had a cough for about 3 weeks that has got worse over time. Starting last week she has been pressure in head and chest,  drainage in the back of her throat. She has been taken otc medications with no relief.    Subjective   Amber Washington is a 65 y.o. established patient. Telephone visit today for cough  HPI Onset 3 weeks ago Cough is worse at night Wakes very congested "heavy cough", feels like there is mucus but cannot get it out Hx of asthma, no current issues. Taking inhaler with limited relief. T2dm: sugars have been appropriate, 76-126  Possible covid exposure on May 9 or so - husband was positive, but limited exposure Vaccinated x2 and boosted x2 Negative testing throughout previous weeks.  Patient Active Problem List   Diagnosis Date Noted  . Benign paroxysmal positional vertigo 05/14/2018  . Bronchitis, asthmatic 11/22/2015  . Insomnia 05/31/2014  . GERD (gastroesophageal reflux  disease) 11/02/2013  . Physical exam, annual 02/05/2011  . History of breast cancer 06/22/2008  . BEN NEOPLASM PANCREAS EXCEPT ISLETS LANGERHANS 06/22/2008  . ALLERGIC RHINITIS 06/22/2008  . Type 2 diabetes mellitus with hyperglycemia, with long-term current use of insulin ([place]) 02/24/2008  . HYPERCHOLESTEROLEMIA 02/24/2008  . ANXIETY 02/24/2008  . Essential hypertension 02/24/2008  . Asthma 02/24/2008    Past Medical History:  Diagnosis Date  . Allergic rhinitis   . Anxiety state, unspecified   . Asthma   . Benign neoplasm of pancreas, except islets of Langerhans   . Borderline hypertension   . Hypercholesteremia   . Malignant neoplasm of breast (female), unspecified site   . Osteoporosis, unspecified   . Personal history of chemotherapy   . Personal history of radiation therapy   . Stenosis of nasolacrimal duct, acquired   . Type II or unspecified type diabetes mellitus without mention of complication, not stated as uncontrolled     Current Outpatient Medications  Medication Sig Dispense Refill  . albuterol (VENTOLIN HFA) 108 (90 Base) MCG/ACT inhaler Inhale 2 puffs into the lungs every 6 (six) hours as needed for wheezing or shortness of breath. 1 Inhaler 2  . ALPRAZolam (XANAX) 0.5 MG tablet TAKE ONE TABLET BY MOUTH THREE TIMES DAILY AS NEEDED FOR SLEEP OR FOR ANXIETY 90 tablet 1  . aspirin 81 MG tablet Take 81 mg by mouth daily.    Marland Kitchen atorvastatin (LIPITOR) 10 MG tablet TAKE 1 TABLET DAILY 90 tablet 1  . BAYER MICROLET LANCETS lancets Use 1-2x a DAY  - for [employer]  100 each 12  . Cholecalciferol (VITAMIN D) 2000 UNITS CAPS Take 1 capsule by mouth daily.    . Continuous Blood Gluc Receiver (FREESTYLE LIBRE 2 READER) DEVI 1 each by Does not apply route daily. 1 each 0  . Continuous Blood Gluc Sensor (FREESTYLE LIBRE 2 SENSOR) MISC 1 each by Does not apply route every 14 (fourteen) days. 6 each 3  . glucose blood test strip Use as instructed to check blood sugar 1-2 times  daily. 200 each 12  . insulin aspart (NOVOLOG FLEXPEN) 100 UNIT/ML FlexPen Novolog Flexpen U-100 Insulin aspart 100 unit/mL (3 mL) subcutaneous  INJECT 4 TO 6 UNITS INTO THE SKIN BEFORE DINNER    . insulin aspart (NOVOLOG) 100 UNIT/ML injection Inject 4-6 units into the skin before dinner 15 mL 3  . Insulin Glargine (BASAGLAR KWIKPEN) 100 UNIT/ML Inject 0.07-0.08 mLs (7-8 Units total) into the skin daily. 15 mL 5  . Insulin Pen Needle (CAREFINE PEN NEEDLES) 32G X 4 MM MISC Use 2-3x a day 200 each 3  . JANUVIA 100 MG tablet TAKE 1 TABLET BY MOUTH DAILY. 30 tablet 11  . losartan (COZAAR) 50 MG tablet Take 1 tablet (50 mg total) by mouth daily. 90 tablet 0  . metFORMIN (GLUCOPHAGE-XR) 500 MG 24 hr tablet TAKE 2 TABLETS 2 TIMES     DAILY WITH MEALS (Patient taking differently: Patient reports taking 1xdaily) 360 tablet 3  . montelukast (SINGULAIR) 10 MG tablet Take 1 tablet (10 mg total) by mouth at bedtime. 90 tablet 0  . ONETOUCH DELICA LANCETS 62I MISC OneTouch Delica Lancets 33 gauge    . budesonide-formoterol (SYMBICORT) 80-4.5 MCG/ACT inhaler Symbicort 80 mcg-4.5 mcg/actuation HFA aerosol inhaler  INHALE 2 PUFFS BY MOUTH TWICE DAILY    . meclizine (ANTIVERT) 25 MG tablet Take 1 tablet (25 mg total) by mouth 3 (three) times daily as needed for dizziness. (Patient not taking: Reported on 02/05/2021) 45 tablet 0  . NOVOLOG FLEXPEN 100 UNIT/ML FlexPen SMARTSIG:4-6 Unit(s) SUB-Q Every Evening (Patient not taking: Reported on 02/05/2021)     No current facility-administered medications for this visit.    Allergies  Allergen Reactions  . Influenza Vaccines Other (See Comments)    Unknown, allergic to egg whites  . Eggs Or Egg-Derived Products     Social History   Socioeconomic History  . Marital status: Married    Spouse name: Not on file  . Number of children: 2  . Years of education: Not on file  . Highest education level: Not on file  Occupational History  . Not on file  Tobacco Use   . Smoking status: Never Smoker  . Smokeless tobacco: Never Used  Vaping Use  . Vaping Use: Never used  Substance and Sexual Activity  . Alcohol use: Yes    Comment: social use  . Drug use: Never  . Sexual activity: Yes  Other Topics Concern  . Not on file  Social History Narrative  . Not on file   Social Determinants of Health   Financial Resource Strain: Not on file  Food Insecurity: Not on file  Transportation Needs: Not on file  Physical Activity: Not on file  Stress: Not on file  Social Connections: Not on file  Intimate Partner Violence: Not on file    ROS Per hpi   Objective   Vitals as reported by the patient: Today's Vitals   02/05/21 0903  Weight: 158 lb (71.7 kg)    There are no diagnoses linked to  this encounter.  PLAN  Concern for bacterial lower respiratory infection given 3 week duration and progression of symptoms.  z pack and prednisone  Tessalon and hycodan for symptom relief  Return to clinic if worsening or failing to improve  Patient encouraged to call clinic with any questions, comments, or concerns.  I discussed the assessment and treatment plan with the patient. The patient was provided an opportunity to ask questions and all were answered. The patient agreed with the plan and demonstrated an understanding of the instructions.   The patient was advised to call back or seek an in-person evaluation if the symptoms worsen or if the condition fails to improve as anticipated.  I provided 13 minutes of non-face-to-face time during this encounter.  Maximiano Coss, NP  [facility] at [facility]

## 2021-02-05 NOTE — Patient Instructions (Signed)
° ° ° °  If you have lab work done today you will be contacted with your lab results within the next 2 weeks.  If you have not heard from us then please contact us. The fastest way to get your results is to register for My Chart. ° ° °IF you received an x-ray today, you will receive an invoice from [facility]. Please contact [facility] at [phone] with questions or concerns regarding your invoice.  ° °IF you received labwork today, you will receive an invoice from [facility]. Please contact [facility] at [phone] with questions or concerns regarding your invoice.  ° °Our billing staff will not be able to assist you with questions regarding bills from these companies. ° °You will be contacted with the lab results as soon as they are available. The fastest way to get your results is to activate your My Chart account. Instructions are located on the last page of this paperwork. If you have not heard from us regarding the results in 2 weeks, please contact this office. °  ° ° ° °

## 2021-03-06 ENCOUNTER — Encounter: Payer: Self-pay | Admitting: *Deleted

## 2021-03-07 ENCOUNTER — Other Ambulatory Visit: Payer: Self-pay | Admitting: Family Medicine

## 2021-03-07 ENCOUNTER — Other Ambulatory Visit: Payer: Self-pay

## 2021-03-07 DIAGNOSIS — Z794 Long term (current) use of insulin: Secondary | ICD-10-CM

## 2021-03-07 MED ORDER — ACCU-CHEK SOFTCLIX LANCETS MISC: 12 refills | Status: DC

## 2021-03-07 MED ORDER — ACCU-CHEK GUIDE W/DEVICE KIT: PACK | 0 refills | Status: AC

## 2021-03-07 MED ORDER — ACCU-CHEK GUIDE VI STRP: ORAL_STRIP | 12 refills | Status: DC

## 2021-03-18 ENCOUNTER — Other Ambulatory Visit: Payer: Self-pay | Admitting: Family Medicine

## 2021-03-18 MED ORDER — MONTELUKAST SODIUM 10 MG PO TABS: 10.0000 mg | ORAL_TABLET | Freq: Every day | ORAL | 0 refills | Status: DC

## 2021-03-25 ENCOUNTER — Other Ambulatory Visit: Payer: Self-pay | Admitting: Family Medicine

## 2021-03-26 MED ORDER — LOSARTAN POTASSIUM 50 MG PO TABS: 50.0000 mg | ORAL_TABLET | Freq: Every day | ORAL | 0 refills | Status: DC

## 2021-04-29 ENCOUNTER — Other Ambulatory Visit: Payer: Self-pay | Admitting: Internal Medicine

## 2021-05-16 ENCOUNTER — Encounter: Payer: BC Managed Care – PPO | Admitting: Family Medicine

## 2021-05-23 ENCOUNTER — Ambulatory Visit (INDEPENDENT_AMBULATORY_CARE_PROVIDER_SITE_OTHER): Payer: BC Managed Care – PPO | Admitting: Internal Medicine

## 2021-05-23 ENCOUNTER — Encounter: Payer: Self-pay | Admitting: Internal Medicine

## 2021-05-23 ENCOUNTER — Other Ambulatory Visit: Payer: Self-pay

## 2021-05-23 VITALS — BP 116/80 | HR 87 | Ht 69.0 in | Wt 156.8 lb

## 2021-05-23 DIAGNOSIS — E1165 Type 2 diabetes mellitus with hyperglycemia: Secondary | ICD-10-CM

## 2021-05-23 DIAGNOSIS — E7800 Pure hypercholesterolemia, unspecified: Secondary | ICD-10-CM | POA: Diagnosis not present

## 2021-05-23 DIAGNOSIS — Z794 Long term (current) use of insulin: Secondary | ICD-10-CM | POA: Diagnosis not present

## 2021-05-23 LAB — POCT GLYCOSYLATED HEMOGLOBIN (HGB A1C): Hemoglobin A1C: 6.4 % — AB (ref 4.0–5.6)

## 2021-05-23 MED ORDER — BASAGLAR KWIKPEN 100 UNIT/ML ~~LOC~~ SOPN: 8.0000 [IU] | PEN_INJECTOR | Freq: Every day | SUBCUTANEOUS | 3 refills | Status: DC

## 2021-05-23 NOTE — Progress Notes (Signed)
Patient ID: Amber Washington, female   DOB: May 27, 1956, 65 y.o.   MRN: [id number]   This visit occurred during the SARS-CoV-2 public health emergency.  Safety protocols were in place, including screening questions prior to the visit, additional usage of staff PPE, and extensive cleaning of exam room while observing appropriate contact time as indicated for disinfecting solutions.   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  Last visit 4 months ago.  Interim history: No increased urination, blurry vision, nausea, chest pain.  Reviewed HbA1c levels: Lab Results  Component Value Date   HGBA1C 6.9 (A) 01/09/2021   HGBA1C 7.4 (A) 09/10/2020   HGBA1C 6.7 (A) 05/09/2020   She is on: - Januvia 100 mg before breakfast - Metformin ER 500 >> 1000 mg twice a day with meals - diarrhea after the increase in dose >> 500 mg in a.m. and 1000 mg with dinner >> 1000 mg with dinner - Basaglar 5 units daily at bedtime >> .Marland KitchenMarland Kitchen8 units in a.m. - Novolog-started 09/2020  -2-3 units before dinner and as needed before any other meals >> however still uses this only at dinner She was on Jardiance 25 mg before b'fast >> yeast inf's >> stopped.  Pt checks sugars 2-3 times a day - am: 92-130, 142, 145 >> 88-126, 134 >> 63, 86-130, 134, 138 - 2h after b'fast: 104 >> n/c >> 162 >> n/c >> 217 >> 96-162, 195 - before lunch:  71, 90-131, 136 >> 76-129 >> 87-147, 165 (snack) - 2h after lunch:  80 , 101 >> n/c >> 174, 179 >> n/c >> n/c >> 100-280 - before dinner: 140, 148 >> 128-151 >> 128-154 >> 88-170, 197 - 2h after dinner: 130, 228 >> n/c >> 61-161, 175 >> 75-160 - bedtime:  n/c >> 129 >> n/c >> 157, 160, 201 >> 103-160 - nighttime:  got glu tablets >> n/c Lowest: 64 after exercise ...>> 71 >> 71 >> 55 >> 58 x 1 Highest:289 (05/2019) >> 179 >> 154 >> 148  >> 217 >> 280  Glucometer: Bayer Contour  Pt's meals are: - Breakfast: oatmeal + yoghurt + crackers - Lunch: salads + meat/cheese/crackers + fruit - Dinner: chicken + veggies or salad - Snacks: nuts , crackers, carrots - 1-x a day She only drinks water.  She exercises by walking.  No CKD, last BUN/creatinine:  Lab Results  Component Value Date   BUN 19 11/07/2020   BUN 29 (H) 05/09/2020   CREATININE 0.82 11/07/2020   CREATININE 0.86 05/09/2020   Normal ACR: Lab Results  Component Value Date   MICRALBCREAT 6.2 12/28/2019   MICRALBCREAT 3.4 06/28/2012   MICRALBCREAT 2.3 01/27/2011   MICRALBCREAT 3.2 02/05/2010  On losartan.  + HL; last set of lipids: Lab Results  Component Value Date   CHOL 128 11/07/2020   HDL 45.20 11/07/2020   LDLCALC 65 11/07/2020   TRIG 88.0 11/07/2020   CHOLHDL 3 11/07/2020  On Lipitor.  - last eye exam was on 08/30/2020: No DR reportedly  -she denies numbness and tingling in her feet.  She has a history of HTN, hematuria, chronic insomnia-Xanax helping.  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  ROS: Constitutional: no weight gain/no weight loss, no fatigue, no subjective hyperthermia, no subjective hypothermia Eyes: no blurry vision,  no xerophthalmia ENT: no sore throat, no nodules palpated in neck, no dysphagia, no odynophagia, no hoarseness Cardiovascular: no CP/no SOB/no palpitations/no leg swelling Respiratory: no cough/no SOB/no wheezing Gastrointestinal: no N/no V/no D/no C/no acid reflux Musculoskeletal: no muscle aches/no joint aches Skin: no rashes, no hair loss Neurological: no tremors/no numbness/no tingling/no dizziness  I reviewed pt's medications, allergies, PMH, social hx, family hx, and changes were documented in the history of present illness. Otherwise, unchanged from my initial visit note.  Past Medical History:  Diagnosis Date   Allergic rhinitis    Anxiety  state, unspecified    Asthma    Benign neoplasm of pancreas, except islets of Langerhans    Borderline hypertension    Hypercholesteremia    Malignant neoplasm of breast (female), unspecified site    Osteoporosis, unspecified    Personal history of chemotherapy    Personal history of radiation therapy    Stenosis of nasolacrimal duct, acquired    Type II or unspecified type diabetes mellitus without mention of complication, not stated as uncontrolled    Past Surgical History:  Procedure Laterality Date   BREAST BIOPSY     BREAST EXCISIONAL BIOPSY     BREAST LUMPECTOMY     [id number] 1995   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   NASAL SEPTUM SURGERY     right breast cancer sugery  1995   surgery on neck for removal of birthmark     tur induction     Social History   Social History   Marital status: Married    Spouse name: N/A   Number of children: 2   Occupational History   Radiation protection practitioner   Social History Main Topics   Smoking status: Never Smoker   Smokeless tobacco: Never Used   Alcohol use No   Drug use: No   Current Outpatient Medications on File Prior to Visit  Medication Sig Dispense Refill   Accu-Chek Softclix Lancets lancets Use to check BS 3x a day 200 each 12   albuterol (VENTOLIN HFA) 108 (90 Base) MCG/ACT inhaler Inhale 2 puffs into the lungs every 6 (six) hours as needed for wheezing or shortness of breath. 1 Inhaler 2   ALPRAZolam (XANAX) 0.5 MG tablet TAKE ONE TABLET BY MOUTH THREE TIMES DAILY AS NEEDED FOR SLEEP OR FOR ANXIETY 90 tablet 1   aspirin 81 MG tablet Take 81 mg by mouth daily.     atorvastatin (LIPITOR) 10 MG tablet TAKE 1 TABLET DAILY 90 tablet 1   BAYER MICROLET LANCETS lancets Use 1-2x a DAY  - for Bayer Contour 100 each 12   benzonatate (TESSALON) 100 MG capsule Take 1 capsule (100 mg total) by mouth 2 (two) times daily as needed for cough. 20 capsule 0   Blood Glucose Monitoring Suppl (ACCU-CHEK GUIDE)  w/Device KIT Use as directed 1 kit 0   budesonide-formoterol (SYMBICORT) 80-4.5 MCG/ACT inhaler Symbicort 80 mcg-4.5 mcg/actuation HFA aerosol inhaler  INHALE 2 PUFFS BY MOUTH TWICE DAILY     Cholecalciferol (VITAMIN D) 2000 UNITS CAPS Take 1 capsule by mouth daily.     Continuous Blood Gluc Receiver (FREESTYLE LIBRE 2 READER) DEVI 1 each by Does not apply route daily. 1 each 0   Continuous Blood Gluc Sensor (FREESTYLE LIBRE 2 SENSOR) MISC 1 each by Does not apply route every 14 (fourteen) days. 6 each 3   glucose blood (ACCU-CHEK GUIDE) test strip Use to check BS 3x a day 300 each 12  glucose blood test strip Use as instructed to check blood sugar 1-2 times daily. 200 each 12   HYDROcodone bit-homatropine (HYCODAN) 5-1.5 MG/5ML syrup Take 5 mLs by mouth at bedtime as needed for cough. 120 mL 0   insulin aspart (NOVOLOG FLEXPEN) 100 UNIT/ML FlexPen Novolog Flexpen U-100 Insulin aspart 100 unit/mL (3 mL) subcutaneous  INJECT 4 TO 6 UNITS INTO THE SKIN BEFORE DINNER     insulin aspart (NOVOLOG) 100 UNIT/ML injection Inject 4-6 units into the skin before dinner 15 mL 3   Insulin Glargine (BASAGLAR KWIKPEN) 100 UNIT/ML Inject 0.07-0.08 mLs (7-8 Units total) into the skin daily. 15 mL 5   Insulin Pen Needle (CAREFINE PEN NEEDLES) 32G X 4 MM MISC Use 2-3x a day 200 each 3   JANUVIA 100 MG tablet TAKE 1 TABLET BY MOUTH DAILY. 30 tablet 11   losartan (COZAAR) 50 MG tablet Take 1 tablet (50 mg total) by mouth daily. 90 tablet 0   meclizine (ANTIVERT) 25 MG tablet Take 1 tablet (25 mg total) by mouth 3 (three) times daily as needed for dizziness. (Patient not taking: Reported on 02/05/2021) 45 tablet 0   metFORMIN (GLUCOPHAGE-XR) 500 MG 24 hr tablet TAKE 2 TABLETS 2 TIMES     DAILY WITH MEALS (Patient taking differently: Patient reports taking 1xdaily) 360 tablet 3   montelukast (SINGULAIR) 10 MG tablet Take 1 tablet (10 mg total) by mouth at bedtime. 90 tablet 0   NOVOLOG FLEXPEN 100 UNIT/ML FlexPen  SMARTSIG:4-6 Unit(s) SUB-Q Every Evening (Patient not taking: Reported on 02/05/2021)     ONETOUCH DELICA LANCETS 53G MISC OneTouch Delica Lancets 33 gauge     predniSONE (DELTASONE) 10 MG tablet Take 1 tablet (10 mg total) by mouth daily with breakfast. 5 tablet 0   No current facility-administered medications on file prior to visit.   Allergies  Allergen Reactions   Influenza Vaccines Other (See Comments)    Unknown, allergic to egg whites   Eggs Or Egg-Derived Products    FH: - mother - leukemia, father - liver cancer - mother - HTN - DM in nephew.  PE: BP 116/80 (BP Location: Left Arm, Patient Position: Sitting, Cuff Size: Normal)   Pulse 87   Ht 5' 9"  (1.753 m)   Wt 156 lb 12.8 oz (71.1 kg)   SpO2 96%   BMI 23.16 kg/m   Wt Readings from Last 3 Encounters:  05/23/21 156 lb 12.8 oz (71.1 kg)  02/05/21 158 lb (71.7 kg)  01/09/21 160 lb (72.6 kg)   Constitutional: normal weight, in NAD Eyes: PERRLA, EOMI, no exophthalmos, + arcus cornealis ENT: moist mucous membranes, no thyromegaly, no cervical lymphadenopathy Cardiovascular: RRR, No MRG Respiratory: CTA B Gastrointestinal: abdomen soft, NT, ND, BS+ Musculoskeletal: no deformities, strength intact in all 4 Skin: moist, warm, no rashes Neurological: no tremor with outstretched hands, DTR normal in all 4  ASSESSMENT: 1. DM, insulin-dependent, previously uncontrolled, without long term complications, but with hyperglycemia  Component     Latest Ref Rng & Units 12/04/2016  C-Peptide     0.80 - 3.85 ng/mL 0.94  Glucose, Fasting     65 - 99 mg/dL 160 (H)  C-peptide is not very high, but is still in the normal range.   She has a history of distal pancreatic resection (?%) For a benign cyst and she developed diabetes soon after the surgery.  2. HL  PLAN:  1. Patient with history of diabetes due to distal pancreatectomy for pancreatic cyst.  Sugars improved after adding low-dose basal insulin but they worsened again  afterwards so we had to add NovoLog with dinner.  At last visit, since she had higher blood sugars after the rest of the meals, I advised her to use NovoLog before the rest of the meals as needed.  I also suggested a CGM.  She was reticent to start this as she felt overwhelmed by her diabetes but I advised her to still look up the freestyle libre 2 CGM which is easier to use and he can simplify her fingerstick routine.  She did try to obtain this but this was not covered.  She also did not have success with getting the Dexcom CGM.  We discussed about retrying to start the CGM after the first of the year.  -In the past, she had low blood sugars after activity so I advised her to only use NovoLog before breakfast and lunch if she does not plan to walk afterwards. -At this visit, she tells me that she is still only taking it before dinner as she is usually active during the day.  Reviewing her blood sugars in her log, they are mostly at goal with few exceptions: She had 2 low blood sugars in the 50s and 60s 2 months ago after dinnertime, but not recently.  Therefore, for now, I advised her to continue the same dose of NovoLog before this meal.  Sugars are not usually higher after lunch, occasionally in the 200s, and we discussed to write down what she is eating when sugars increase this much after lunch and then start taking NovoLog with his meals.  She does not appear to meet NovoLog before all lunches, only the ones that contain more carbs.  She is trying to limit carbs, she tells me.  She continues to have food and healthy starches like sweet potatoes. -As of now, I do not feel she absolutely needs a change in dose of Basaglar for the rest of her diabetic regimen. - I suggested to:  Patient Instructions  Please continue: - Metformin ER 1000 mg with dinner - Januvia 100 mg before b'fast - Basaglar 8 units in a.m. - NovoLog 2-3 units before dinner and as needed before lunch (more starches/carbs, or eating  out - but try not to take it if you plan to be active after the meal).   Please return in 4 months   - we checked her HbA1c: 6.4% (lower) - advised to check sugars at different times of the day - 4x a day, rotating check times - advised for yearly eye exams >> she is UTD - return to clinic in 4 months  2. HL -She has arcus cornealis -Reviewed latest lipid panel from 11/2020: All fractions at goal Lab Results  Component Value Date   CHOL 128 11/07/2020   HDL 45.20 11/07/2020   LDLCALC 65 11/07/2020   TRIG 88.0 11/07/2020   CHOLHDL 3 11/07/2020  -Continues on Lipitor 10 mg daily without side effects  Philemon Kingdom, MD PhD [facility]

## 2021-05-23 NOTE — Patient Instructions (Addendum)
Please continue: - Metformin ER 1000 mg with dinner - Januvia 100 mg before b'fast - Basaglar 8 units in a.m. - NovoLog 2-3 units before dinner and as needed before lunch (more starches/carbs, or eating out - but try not to take it if you plan to be active after the meal).   Please return in 4 months

## 2021-05-23 NOTE — Addendum Note (Signed)
Addended by: Lauralyn Primes on: 05/23/2021 10:33 AM   Modules accepted: Orders

## 2021-06-05 ENCOUNTER — Other Ambulatory Visit: Payer: Self-pay | Admitting: Family Medicine

## 2021-06-16 ENCOUNTER — Other Ambulatory Visit: Payer: Self-pay | Admitting: Family Medicine

## 2021-06-24 ENCOUNTER — Other Ambulatory Visit: Payer: Self-pay | Admitting: Family Medicine

## 2021-06-24 ENCOUNTER — Encounter: Payer: Self-pay | Admitting: Family Medicine

## 2021-06-24 MED ORDER — ALPRAZOLAM 0.5 MG PO TABS: ORAL_TABLET | ORAL | 1 refills | Status: DC

## 2021-06-24 MED ORDER — LOSARTAN POTASSIUM 50 MG PO TABS: 50.0000 mg | ORAL_TABLET | Freq: Every day | ORAL | 0 refills | Status: DC

## 2021-06-24 NOTE — Telephone Encounter (Signed)
UTD on visits, has an upcoming one on the 26th. Please escribe alprazolam

## 2021-07-03 ENCOUNTER — Encounter: Payer: BC Managed Care – PPO | Admitting: Family Medicine

## 2021-07-03 ENCOUNTER — Encounter: Payer: Self-pay | Admitting: Family Medicine

## 2021-07-03 ENCOUNTER — Ambulatory Visit (INDEPENDENT_AMBULATORY_CARE_PROVIDER_SITE_OTHER): Payer: BC Managed Care – PPO | Admitting: Family Medicine

## 2021-07-03 ENCOUNTER — Other Ambulatory Visit: Payer: Self-pay

## 2021-07-03 VITALS — BP 118/78 | HR 91 | Temp 97.7°F | Resp 16 | Ht 69.0 in | Wt 153.6 lb

## 2021-07-03 DIAGNOSIS — Z23 Encounter for immunization: Secondary | ICD-10-CM

## 2021-07-03 DIAGNOSIS — Z0000 Encounter for general adult medical examination without abnormal findings: Secondary | ICD-10-CM

## 2021-07-03 DIAGNOSIS — I10 Essential (primary) hypertension: Secondary | ICD-10-CM | POA: Diagnosis not present

## 2021-07-03 LAB — CBC WITH DIFFERENTIAL/PLATELET
Basophils Absolute: 0.1 10*3/uL (ref 0.0–0.1)
Basophils Relative: 1.1 % (ref 0.0–3.0)
Eosinophils Absolute: 0.1 10*3/uL (ref 0.0–0.7)
Eosinophils Relative: 1.7 % (ref 0.0–5.0)
HCT: 37.3 % (ref 36.0–46.0)
Hemoglobin: 12.3 g/dL (ref 12.0–15.0)
Lymphocytes Relative: 25.2 % (ref 12.0–46.0)
Lymphs Abs: 1.8 10*3/uL (ref 0.7–4.0)
MCHC: 33 g/dL (ref 30.0–36.0)
MCV: 87.6 fl (ref 78.0–100.0)
Monocytes Absolute: 0.9 10*3/uL (ref 0.1–1.0)
Monocytes Relative: 12.1 % — ABNORMAL HIGH (ref 3.0–12.0)
Neutro Abs: 4.3 10*3/uL (ref 1.4–7.7)
Neutrophils Relative %: 59.9 % (ref 43.0–77.0)
Platelets: 329 10*3/uL (ref 150.0–400.0)
RBC: 4.26 Mil/uL (ref 3.87–5.11)
RDW: 13.5 % (ref 11.5–15.5)
WBC: 7.2 10*3/uL (ref 4.0–10.5)

## 2021-07-03 LAB — TSH: TSH: 1 u[IU]/mL (ref 0.35–5.50)

## 2021-07-03 LAB — LIPID PANEL
Cholesterol: 137 mg/dL (ref 0–200)
HDL: 45.3 mg/dL (ref >39.00)
LDL Cholesterol: 64 mg/dL (ref 0–99)
NonHDL: 91.28
Total CHOL/HDL Ratio: 3
Triglycerides: 136 mg/dL (ref 0.0–149.0)
VLDL: 27.2 mg/dL (ref 0.0–40.0)

## 2021-07-03 LAB — HEPATIC FUNCTION PANEL
ALT: 8 U/L (ref 0–35)
AST: 14 U/L (ref 0–37)
Albumin: 4.3 g/dL (ref 3.5–5.2)
Alkaline Phosphatase: 67 U/L (ref 39–117)
Bilirubin, Direct: 0.1 mg/dL (ref 0.0–0.3)
Total Bilirubin: 0.4 mg/dL (ref 0.2–1.2)
Total Protein: 7.2 g/dL (ref 6.0–8.3)

## 2021-07-03 LAB — BASIC METABOLIC PANEL
BUN: 28 mg/dL — ABNORMAL HIGH (ref 6–23)
CO2: 32 mEq/L (ref 19–32)
Calcium: 9.5 mg/dL (ref 8.4–10.5)
Chloride: 99 mEq/L (ref 96–112)
Creatinine, Ser: 1.05 mg/dL (ref 0.40–1.20)
GFR: 56.75 mL/min — ABNORMAL LOW (ref >60.00)
Glucose, Bld: 219 mg/dL — ABNORMAL HIGH (ref 70–99)
Potassium: 3.8 mEq/L (ref 3.5–5.1)
Sodium: 137 mEq/L (ref 135–145)

## 2021-07-03 MED ORDER — METHOCARBAMOL 500 MG PO TABS: 500.0000 mg | ORAL_TABLET | Freq: Three times a day (TID) | ORAL | 1 refills | Status: DC | PRN

## 2021-07-03 NOTE — Progress Notes (Signed)
Subjective:    Patient ID: Amber Washington, female    DOB: 12/04/1955, 65 y.o.   MRN: [id number]  HPI CPE- UTD on pap, mammo, Tdap.  Allergic to flu shot but can do egg free.  Due for colonoscopy- plans to schedule.  UTD on eye exam.  Due for foot exam.  Patient Care Team    Relationship Specialty Notifications Start End  Midge Minium, MD PCP - General Family Medicine  02/16/14   Richmond Campbell, MD Consulting Physician Gastroenterology  06/21/15   Key, Nelia Shi, NP Nurse Practitioner Gynecology  06/26/16   Clent Jacks, MD Consulting Physician Ophthalmology  06/26/16     Health Maintenance  Topic Date Due  . FOOT EXAM  05/09/2021  . Zoster Vaccines- Shingrix (1 of 2) 10/03/2021 (Originally 07/05/2008)  . Pneumococcal Vaccine 33-5 Years old (2 - PCV) 07/03/2022 (Originally 06/26/2017)  . COLONOSCOPY (Pts 45-44yrs Insurance coverage will need to be confirmed)  07/03/2022 (Originally 06/26/2020)  . OPHTHALMOLOGY EXAM  08/22/2021  . MAMMOGRAM  10/02/2021  . HEMOGLOBIN A1C  11/20/2021  . TETANUS/TDAP  07/07/2022  . PAP SMEAR-Modifier  10/25/2025  . COVID-19 Vaccine  Completed  . Hepatitis C Screening  Completed  . HIV Screening  Completed  . HPV VACCINES  Aged Out      Review of Systems Patient reports no vision/ hearing changes, adenopathy,fever, weight change,  persistant/recurrent hoarseness , swallowing issues, chest pain, palpitations, edema, persistant/recurrent cough, hemoptysis, dyspnea (rest/exertional/paroxysmal nocturnal), gastrointestinal bleeding (melena, rectal bleeding), abdominal pain, significant heartburn, bowel changes, GU symptoms (dysuria, hematuria, incontinence), Gyn symptoms (abnormal  bleeding, pain),  syncope, focal weakness, memory loss, numbness & tingling, skin/hair/nail changes, abnormal bruising or bleeding, anxiety, or depression.   Pulled lower back muscle 1 week ago- pt is icing, stretching, walking.  Pain is severe at night and radiates into  L thigh.  This visit occurred during the SARS-CoV-2 public health emergency.  Safety protocols were in place, including screening questions prior to the visit, additional usage of staff PPE, and extensive cleaning of exam room while observing appropriate contact time as indicated for disinfecting solutions.      Objective:   Physical Exam General Appearance:    Alert, cooperative, no distress, appears stated age  Head:    Normocephalic, without obvious abnormality, atraumatic  Eyes:    PERRL, conjunctiva/corneas clear, EOM's intact, fundi    benign, both eyes  Ears:    Normal TM's and external ear canals, both ears  Nose:   Deferred due to COVID  Throat:   Neck:   Supple, symmetrical, trachea midline, no adenopathy;    Thyroid: no enlargement/tenderness/nodules  Back:     Symmetric, no curvature, ROM normal, no CVA tenderness  Lungs:     Clear to auscultation bilaterally, respirations unlabored  Chest Wall:    No tenderness or deformity   Heart:    Regular rate and rhythm, S1 and S2 normal, no murmur, rub   or gallop  Breast Exam:    Deferred to GYN  Abdomen:     Soft, non-tender, bowel sounds active all four quadrants,    no masses, no organomegaly  Genitalia:    Deferred to GYN  Rectal:    Extremities:   Extremities normal, atraumatic, no cyanosis or edema  Pulses:   2+ and symmetric all extremities  Skin:   Skin color, texture, turgor normal, no rashes or lesions  Lymph nodes:   Cervical, supraclavicular, and axillary nodes normal  Neurologic:  CNII-XII intact, normal strength, sensation and reflexes    throughout          Assessment & Plan:

## 2021-07-03 NOTE — Assessment & Plan Note (Signed)
Pt's PE WNL.  UTD on pap, mammo.  Due for colonoscopy- pt to schedule.  UTD on eye exam.  Foot exam done today.  Check labs.  Anticipatory guidance provided.

## 2021-07-03 NOTE — Assessment & Plan Note (Signed)
Chronic problem.  Excellent control.  Currently asymptomatic.  Check labs due to ARB.  No anticipated med changes.

## 2021-07-03 NOTE — Patient Instructions (Addendum)
Follow up in 6 months to recheck BP and cholesterol We'll notify you of your lab results and make any changes if needed Continue to work on healthy diet and regular exercise- you look great! Call and schedule your colonoscopy Call with any questions or concerns Stay Safe!  Stay Healthy! Happy Early Rudene Anda!!!

## 2021-07-03 NOTE — Addendum Note (Signed)
Addended by: Juliann Pulse on: 07/03/2021 02:00 PM   Modules accepted: Orders

## 2021-07-05 ENCOUNTER — Encounter: Payer: Self-pay | Admitting: Family Medicine

## 2021-07-05 MED ORDER — CYCLOBENZAPRINE HCL 10 MG PO TABS: 10.0000 mg | ORAL_TABLET | Freq: Every day | ORAL | 0 refills | Status: DC

## 2021-07-13 ENCOUNTER — Encounter: Payer: Self-pay | Admitting: Internal Medicine

## 2021-08-11 ENCOUNTER — Encounter: Payer: Self-pay | Admitting: Internal Medicine

## 2021-08-11 DIAGNOSIS — E1165 Type 2 diabetes mellitus with hyperglycemia: Secondary | ICD-10-CM

## 2021-08-11 DIAGNOSIS — Z794 Long term (current) use of insulin: Secondary | ICD-10-CM

## 2021-08-12 MED ORDER — METFORMIN HCL ER 500 MG PO TB24: 1000.0000 mg | ORAL_TABLET | Freq: Every day | ORAL | 3 refills | Status: DC

## 2021-09-17 LAB — HM DIABETES EYE EXAM

## 2021-09-23 ENCOUNTER — Other Ambulatory Visit: Payer: Self-pay | Admitting: Family Medicine

## 2021-09-23 MED ORDER — LOSARTAN POTASSIUM 50 MG PO TABS: 50.0000 mg | ORAL_TABLET | Freq: Every day | ORAL | 0 refills | Status: DC

## 2021-09-23 MED ORDER — MONTELUKAST SODIUM 10 MG PO TABS: 10.0000 mg | ORAL_TABLET | Freq: Every day | ORAL | 0 refills | Status: DC

## 2021-10-01 ENCOUNTER — Ambulatory Visit (INDEPENDENT_AMBULATORY_CARE_PROVIDER_SITE_OTHER): Payer: BC Managed Care – PPO | Admitting: Internal Medicine

## 2021-10-01 ENCOUNTER — Encounter: Payer: Self-pay | Admitting: Internal Medicine

## 2021-10-01 ENCOUNTER — Other Ambulatory Visit: Payer: Self-pay

## 2021-10-01 VITALS — BP 120/78 | HR 82 | Ht 69.0 in | Wt 154.2 lb

## 2021-10-01 DIAGNOSIS — E1365 Other specified diabetes mellitus with hyperglycemia: Secondary | ICD-10-CM | POA: Diagnosis not present

## 2021-10-01 DIAGNOSIS — E0865 Diabetes mellitus due to underlying condition with hyperglycemia: Secondary | ICD-10-CM

## 2021-10-01 DIAGNOSIS — Z794 Long term (current) use of insulin: Secondary | ICD-10-CM | POA: Diagnosis not present

## 2021-10-01 DIAGNOSIS — E7800 Pure hypercholesterolemia, unspecified: Secondary | ICD-10-CM

## 2021-10-01 LAB — POCT GLYCOSYLATED HEMOGLOBIN (HGB A1C): Hemoglobin A1C: 6.8 % — AB (ref 4.0–5.6)

## 2021-10-01 MED ORDER — FREESTYLE LIBRE 3 SENSOR MISC: 1.0000 | 3 refills | Status: DC

## 2021-10-01 NOTE — Patient Instructions (Addendum)
You can use: - Metformin ER [phone] mg with dinner  Continue: - Januvia 100 mg before b'fast - Basaglar 8 units in a.m. (may increase to 9 units after decreasing Metformin) - NovoLog 3-4 units before dinner and  lunch as needed  (may increase to 4-5 units after decreasing Metformin)  Please return in 4 months

## 2021-10-01 NOTE — Progress Notes (Signed)
Patient ID: Amber Washington, female   DOB: 07/19/1956, 66 y.o.   MRN: [id number]   This visit occurred during the SARS-CoV-2 public health emergency.  Safety protocols were in place, including screening questions prior to the visit, additional usage of staff PPE, and extensive cleaning of exam room while observing appropriate contact time as indicated for disinfecting solutions.   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  Last visit 4 months ago.  Interim history: No increased urination, blurry vision, nausea, chest pain.  Reviewed HbA1c levels: Lab Results  Component Value Date   HGBA1C 6.4 (A) 05/23/2021   HGBA1C 6.9 (A) 01/09/2021   HGBA1C 7.4 (A) 09/10/2020   She is on: - Januvia 100 mg before breakfast - Metformin ER 500 >> 1000 mg twice a day with meals - diarrhea >> .Marland KitchenMarland Kitchen 1000 mg with dinner - Basaglar 5 units daily at bedtime >> ...8 >> 10 >> 8 units in a.m. - Novolog-started 09/2020  -2-3 units before dinner and as needed before any other meals >> however still uses this only at dinner >> 6-8 >> 3-4 units before dinner and occasionally lunch She was on Jardiance 25 mg before b'fast >> yeast inf's >> stopped.  Pt checks sugars 2-3 times a day - am: 88-126, 134 >> 63, 86-130, 134, 138 >> 93-133, 153, 157 - 2h after b'fast: n/c >> 162 >> n/c >> 217 >> 96-162, 195 >> n/c - before lunch:  76-129 >> 87-147, 165 (snack) >> 85-154, 167, 230 - 2h after lunch:  174, 179 >> n/c >> n/c >> 100-280 >> 85, 172 - before dinner: 128-154 >> 88-170, 197 >> 101-215, 264 (snack) - 2h after dinner: n/c >> 61-161, 175 >> 75-160 >> 54 x1, 112, 180 - bedtime:  n/c >> 129 >> n/c >> 157, 160, 201 >> 103-160 >> n/c - nighttime:  got glu tablets >> n/c Lowest: 55 >> 58 x 1 >> 54 (after dinner). Highest:217 >> 280 >> 302 (with  steroids)  Glucometer: Bayer Contour  Pt's meals are: - Breakfast: oatmeal + yoghurt + crackers - Lunch: salads + meat/cheese/crackers + fruit - Dinner: chicken + veggies or salad - Snacks: nuts , crackers, carrots - 1-x a day She only drinks water.  She exercises by walking.  No CKD, last BUN/creatinine:  Lab Results  Component Value Date   BUN 28 (H) 07/03/2021   BUN 19 11/07/2020   CREATININE 1.05 07/03/2021   CREATININE 0.82 11/07/2020   Normal ACR: Lab Results  Component Value Date   MICRALBCREAT 6.2 12/28/2019   MICRALBCREAT 3.4 06/28/2012   MICRALBCREAT 2.3 01/27/2011   MICRALBCREAT 3.2 02/05/2010  On losartan.  + HL; last set of lipids: Lab Results  Component Value Date   CHOL 137 07/03/2021   HDL 45.30 07/03/2021   LDLCALC 64 07/03/2021   TRIG 136.0 07/03/2021   CHOLHDL 3 07/03/2021  On Lipitor.  - last eye exam was on 09/2021: No DR reportedly  -she denies numbness and tingling in her feet.  She has a history of HTN, hematuria, chronic insomnia-Xanax helping.  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  ROS: + see HPI  I reviewed pt's medications, allergies, PMH, social hx, family hx, and changes were documented in the history of present illness. Otherwise, unchanged from  my initial visit note.  Past Medical History:  Diagnosis Date   Allergic rhinitis    Anxiety state, unspecified    Asthma    Benign neoplasm of pancreas, except islets of Langerhans    Borderline hypertension    Hypercholesteremia    Malignant neoplasm of breast (female), unspecified site    Osteoporosis, unspecified    Personal history of chemotherapy    Personal history of radiation therapy    Stenosis of nasolacrimal duct, acquired    Type II or unspecified type diabetes mellitus without mention of complication, not stated as uncontrolled    Past Surgical History:  Procedure Laterality Date   BREAST BIOPSY      BREAST EXCISIONAL BIOPSY     BREAST LUMPECTOMY     [id number] 1995   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   NASAL SEPTUM SURGERY     right breast cancer sugery  1995   surgery on neck for removal of birthmark     tur induction     Social History   Social History   Marital status: Married    Spouse name: N/A   Number of children: 2   Occupational History   Radiation protection practitioner   Social History Main Topics   Smoking status: Never Smoker   Smokeless tobacco: Never Used   Alcohol use No   Drug use: No   Current Outpatient Medications on File Prior to Visit  Medication Sig Dispense Refill   Accu-Chek Softclix Lancets lancets Use to check BS 3x a day 200 each 12   albuterol (VENTOLIN HFA) 108 (90 Base) MCG/ACT inhaler Inhale 2 puffs into the lungs every 6 (six) hours as needed for wheezing or shortness of breath. 1 Inhaler 2   ALPRAZolam (XANAX) 0.5 MG tablet TAKE ONE TABLET BY MOUTH THREE TIMES DAILY AS NEEDED FOR SLEEP OR FOR ANXIETY 90 tablet 1   aspirin 81 MG tablet Take 81 mg by mouth daily.     atorvastatin (LIPITOR) 10 MG tablet TAKE 1 TABLET DAILY 90 tablet 1   BAYER MICROLET LANCETS lancets Use 1-2x a DAY  - for Bayer Contour 100 each 12   Blood Glucose Monitoring Suppl (ACCU-CHEK GUIDE) w/Device KIT Use as directed 1 kit 0   Cholecalciferol (VITAMIN D) 2000 UNITS CAPS Take 1 capsule by mouth daily.     cyclobenzaprine (FLEXERIL) 10 MG tablet Take 1 tablet (10 mg total) by mouth at bedtime. 30 tablet 0   glucose blood (ACCU-CHEK GUIDE) test strip Use to check BS 3x a day 300 each 12   glucose blood test strip Use as instructed to check blood sugar 1-2 times daily. 200 each 12   insulin aspart (NOVOLOG) 100 UNIT/ML injection Inject 4-6 units into the skin before dinner (Patient taking differently: Inject 2 Units into the skin. Inject 4-6 units into the skin before dinner) 15 mL 3   Insulin Glargine (BASAGLAR KWIKPEN) 100 UNIT/ML Inject 8 Units  into the skin daily. 15 mL 3   Insulin Pen Needle (CAREFINE PEN NEEDLES) 32G X 4 MM MISC Use 2-3x a day 200 each 3   JANUVIA 100 MG tablet TAKE 1 TABLET BY MOUTH DAILY. 30 tablet 11   losartan (COZAAR) 50 MG tablet Take 1 tablet (50 mg total) by mouth daily. 90 tablet 0   metFORMIN (GLUCOPHAGE-XR) 500 MG 24 hr tablet Take 2 tablets (1,000 mg total) by mouth daily with supper. 180 tablet 3   methocarbamol (ROBAXIN) 500 MG  tablet Take 1 tablet (500 mg total) by mouth every 8 (eight) hours as needed for muscle spasms. 30 tablet 1   montelukast (SINGULAIR) 10 MG tablet Take 1 tablet (10 mg total) by mouth at bedtime. 90 tablet 0   ONETOUCH DELICA LANCETS 54O MISC OneTouch Delica Lancets 33 gauge     No current facility-administered medications on file prior to visit.   Allergies  Allergen Reactions   Influenza Vaccines Other (See Comments)    Unknown, allergic to egg whites   Eggs Or Egg-Derived Products    FH: - mother - leukemia, father - liver cancer - mother - HTN - DM in nephew.  PE: BP 120/78 (BP Location: Right Arm, Patient Position: Sitting, Cuff Size: Normal)    Pulse 82    Ht _0  (1.753 m)    Wt 154 lb 3.2 oz (69.9 kg)    SpO2 98%    BMI 22.77 kg/m   Wt Readings from Last 3 Encounters:  10/01/21 154 lb 3.2 oz (69.9 kg)  07/03/21 153 lb 9.6 oz (69.7 kg)  05/23/21 156 lb 12.8 oz (71.1 kg)   Constitutional: normal weight, in NAD Eyes: PERRLA, EOMI, no exophthalmos, + arcus cornealis ENT: moist mucous membranes, no thyromegaly, no cervical lymphadenopathy Cardiovascular: RRR, No MRG Respiratory: CTA B Musculoskeletal: no deformities, strength intact in all 4 Skin: moist, warm, no rashes Neurological: no tremor with outstretched hands, DTR normal in all 4  ASSESSMENT: 1. DM, insulin-dependent, previously uncontrolled, without long term complications, but with hyperglycemia  Component     Latest Ref Rng & Units 12/04/2016  C-Peptide     0.80 - 3.85 ng/mL 0.94  Glucose,  Fasting     65 - 99 mg/dL 160 (H)  C-peptide is not very high, but is still in the normal range.   She has a history of distal pancreatic resection (?%) For a benign cyst and she developed diabetes soon after the surgery.  2. HL  PLAN:  1. Patient with history of diabetes due to distal pancreatectomy for pancreatic cyst.  Sugars improved after adding low-dose basal insulin but then they worsened again so we had to add NovoLog with dinner.  We also have her on oral medications (metformin ER and sleeping before inhibitor).  I did suggest a CGM in the past, but unfortunately the freestyle libre and the Dexcom CGM's were not covered.  We decided to try again this year. -At last visit, she was only taking NovoLog before dinner as she usually was very active during the day.  She had some higher blood sugars after lunch at last visit we discussed about taking a low-dose of NovoLog before lunch that contains more carbs.  She was trying to limit her carbs.  She usually eats healthy starches, like sweet potatoes.  At last visit, HbA1c was excellent, decreased, at 6.4%. Sugars increased after her last visit while taking Prednisone in 07/2021 and we had to increase the NovoLog dose to 6 to 8 units and also increase the Lantus from 8 to 10 units in 07/2021. -At today's visit, sugars appear to be under excellent control in the morning, but they do increase later in the day, with occasional sugars in the 180s to 200s before dinner.  Upon questioning, she may have a snack in the afternoon before these meals.  It is difficult to know when this happens since she is not writing this down in her log.  We discussed about the importance of doing so.  If the sugars before dinner increase without having a snack, she may need to cover her lunch with NovoLog more consistently.  As of now, she is only occasionally taking 3 4 units before each meal, depending on the sugars before the meals and what she plans to eat. -At today's  visit, she tells me that metformin positive loose stools.  We discussed that this may have been mostly if she eats fattier meals.  We discussed about possibly splitting the dose in 2 -with breakfast and dinner his GI symptoms, I advised him to decrease the dose to only 1 tablet daily and we may even need to stop metformin completely if her GI symptoms continue.  As of now, they happen approximately once a week.  I did advise her that she may need an increase in Basaglar &/or NovoLog after decreasing metformin and advised her how to do so. -I again recommended a CGM -sent the prescription for the freestyle libre CGM to her pharmacy to see if this is covered by her insurance. - I suggested to:  Patient Instructions  You can use: - Metformin ER [phone] mg with dinner  Continue: - Januvia 100 mg before b'fast - Basaglar 8 units in a.m. (may increase to 9 units after decreasing Metformin) - NovoLog 3-4 units before dinner and  lunch as needed  (may increase to 4-5 units after decreasing Metformin)  Please return in 4 months   - we checked her HbA1c: 6.8% (slightly higher) - advised to check sugars at different times of the day - 4x a day, rotating check times - advised for yearly eye exams >> she is UTD - return to clinic in 3-4 months  2. HL -She has arcus cornealis -Reviewed latest lipid panel from 06/2021: All fractions at goal: Lab Results  Component Value Date   CHOL 137 07/03/2021   HDL 45.30 07/03/2021   LDLCALC 64 07/03/2021   TRIG 136.0 07/03/2021   CHOLHDL 3 07/03/2021  -Continues on Lipitor 10 mg daily without side effects  Philemon Kingdom, MD PhD [facility]

## 2021-10-03 ENCOUNTER — Encounter: Payer: Self-pay | Admitting: Internal Medicine

## 2021-10-03 DIAGNOSIS — E0865 Diabetes mellitus due to underlying condition with hyperglycemia: Secondary | ICD-10-CM

## 2021-10-09 ENCOUNTER — Other Ambulatory Visit (HOSPITAL_COMMUNITY): Payer: Self-pay

## 2021-10-11 ENCOUNTER — Other Ambulatory Visit (HOSPITAL_COMMUNITY): Payer: Self-pay

## 2021-10-11 MED ORDER — FREESTYLE LIBRE 3 SENSOR MISC
1.0000 | 3 refills | Status: DC
  Filled 2021-10-11: qty 6, 84d supply, fill #0
  Filled 2022-01-13: qty 6, 84d supply, fill #1

## 2021-10-11 NOTE — Addendum Note (Signed)
Addended by: Lauralyn Primes on: 10/11/2021 09:39 AM   Modules accepted: Orders

## 2021-10-12 ENCOUNTER — Other Ambulatory Visit (HOSPITAL_COMMUNITY): Payer: Self-pay

## 2021-10-14 ENCOUNTER — Other Ambulatory Visit (HOSPITAL_COMMUNITY): Payer: Self-pay

## 2021-10-23 ENCOUNTER — Encounter: Payer: Self-pay | Admitting: Internal Medicine

## 2021-10-28 ENCOUNTER — Other Ambulatory Visit: Payer: Self-pay | Admitting: Family Medicine

## 2021-10-29 LAB — HM MAMMOGRAPHY: HM Mammogram: NORMAL (ref 0–4)

## 2021-11-11 ENCOUNTER — Other Ambulatory Visit: Payer: Self-pay | Admitting: Internal Medicine

## 2021-11-11 ENCOUNTER — Encounter: Payer: Self-pay | Admitting: Internal Medicine

## 2021-11-19 ENCOUNTER — Encounter: Payer: Self-pay | Admitting: Family Medicine

## 2021-12-09 ENCOUNTER — Encounter: Payer: Self-pay | Admitting: Family Medicine

## 2021-12-10 MED ORDER — ALPRAZOLAM 0.5 MG PO TABS: ORAL_TABLET | ORAL | 1 refills | Status: DC

## 2022-01-03 ENCOUNTER — Encounter: Payer: Self-pay | Admitting: Family Medicine

## 2022-01-03 MED ORDER — LOSARTAN POTASSIUM 50 MG PO TABS: 50.0000 mg | ORAL_TABLET | Freq: Every day | ORAL | 0 refills | Status: DC

## 2022-01-03 MED ORDER — MONTELUKAST SODIUM 10 MG PO TABS: 10.0000 mg | ORAL_TABLET | Freq: Every day | ORAL | 0 refills | Status: DC

## 2022-01-07 ENCOUNTER — Ambulatory Visit: Payer: BC Managed Care – PPO | Admitting: Registered Nurse

## 2022-01-07 ENCOUNTER — Encounter: Payer: Self-pay | Admitting: Registered Nurse

## 2022-01-07 VITALS — BP 126/68 | HR 105 | Temp 98.0°F | Resp 18 | Ht 69.0 in

## 2022-01-07 DIAGNOSIS — R319 Hematuria, unspecified: Secondary | ICD-10-CM

## 2022-01-07 LAB — POCT URINALYSIS DIP (MANUAL ENTRY)
Bilirubin, UA: NEGATIVE
Glucose, UA: NEGATIVE mg/dL
Ketones, POC UA: NEGATIVE mg/dL
Leukocytes, UA: NEGATIVE
Nitrite, UA: NEGATIVE
Spec Grav, UA: >=1.03 — AB (ref 1.010–1.025)
Urobilinogen, UA: 0.2 E.U./dL
pH, UA: 6 (ref 5.0–8.0)

## 2022-01-07 MED ORDER — SULFAMETHOXAZOLE-TRIMETHOPRIM 800-160 MG PO TABS: 1.0000 | ORAL_TABLET | Freq: Two times a day (BID) | ORAL | 0 refills | Status: DC

## 2022-01-07 NOTE — Patient Instructions (Addendum)
Amber Washington -  ? ?Bactrim twice daily x 3 days. Continue to hydrate aggressively - try to have close to 3 liters of water daily. ? ?Call if worsening or failing to improve by this time on Friday. ? ?Thank you, ? ? ?Rich  ? ? ? ?If you have lab work done today you will be contacted with your lab results within the next 2 weeks.  If you have not heard from [place] then please contact us. The fastest way to get your results is to register for My Chart. ? ? ?IF you received an x-ray today, you will receive an invoice from [facility]. Please contact [facility] at [phone] with questions or concerns regarding your invoice.  ? ?IF you received labwork today, you will receive an invoice from [facility]. Please contact [facility] at [phone] with questions or concerns regarding your invoice.  ? ?Our billing staff will not be able to assist you with questions regarding bills from these companies. ? ?You will be contacted with the lab results as soon as they are available. The fastest way to get your results is to activate your My Chart account. Instructions are located on the last page of this paperwork. If you have not heard from [place] regarding the results in 2 weeks, please contact this office. ?  ? ? ?

## 2022-01-07 NOTE — Progress Notes (Signed)
? ?Acute Office Visit ? ?Subjective:  ? ? Patient ID: Amber Washington, female    DOB: 02/14/1956, 66 y.o.   MRN: [id number] ? ?Chief Complaint  ?Patient presents with  ? Urinary Frequency  ?  Patient states she has been having some frequent urination and noticed some blood one morning then started to get better until Saturday. Patient states she noticed more blood , pain , discharge.  ? ? ?HPI ?Patient is in today for urinary frequency ? ?Onset last week with some gross hematuria, frequency, urgency ?Felt like improving until Saturday when hematuria recurred, suprapubic pressure, pain, discharge. ?No flank pain, fevers, chills, sweats, fatigue, nvd ? ?Has not had UTI in the past ? ?No vaginal discharge. Long term monogamous relationship, no concern for STI.  ? ?Outpatient Medications Prior to Visit  ?Medication Sig Dispense Refill  ? Accu-Chek Softclix Lancets lancets Use to check BS 3x a day 200 each 12  ? albuterol (VENTOLIN HFA) 108 (90 Base) MCG/ACT inhaler Inhale 2 puffs into the lungs every 6 (six) hours as needed for wheezing or shortness of breath. 1 Inhaler 2  ? ALPRAZolam (XANAX) 0.5 MG tablet TAKE ONE TABLET BY MOUTH THREE TIMES DAILY AS NEEDED FOR SLEEP OR FOR ANXIETY 90 tablet 1  ? aspirin 81 MG tablet Take 81 mg by mouth daily.    ? atorvastatin (LIPITOR) 10 MG tablet TAKE 1 TABLET DAILY (MYLAN) 90 tablet 1  ? BAYER MICROLET LANCETS lancets Use 1-2x a DAY  - for Bayer Contour 100 each 12  ? Blood Glucose Monitoring Suppl (ACCU-CHEK GUIDE) w/Device KIT Use as directed 1 kit 0  ? Cholecalciferol (VITAMIN D) 2000 UNITS CAPS Take 1 capsule by mouth daily.    ? Continuous Blood Gluc Sensor (FREESTYLE LIBRE 3 SENSOR) MISC USE 1 SENSOR EVERY 14 DAYS. 6 each 3  ? cyclobenzaprine (FLEXERIL) 10 MG tablet Take 1 tablet (10 mg total) by mouth at bedtime. 30 tablet 0  ? glucose blood (ACCU-CHEK GUIDE) test strip Use to check BS 3x a day 300 each 12  ? glucose blood test strip Use as instructed to check blood sugar  1-2 times daily. 200 each 12  ? insulin aspart (NOVOLOG) 100 UNIT/ML injection Inject 4-6 units into the skin before dinner (Patient taking differently: Inject 2 Units into the skin. Inject 4-6 units into the skin before dinner) 15 mL 3  ? Insulin Glargine (BASAGLAR KWIKPEN) 100 UNIT/ML Inject 8 Units into the skin daily. 15 mL 3  ? Insulin Pen Needle (BD PEN NEEDLE NANO U/F) 32G X 4 MM MISC USE 2 TO 3 TIMES A DAY 200 each 3  ? JANUVIA 100 MG tablet TAKE 1 TABLET BY MOUTH DAILY. 30 tablet 11  ? losartan (COZAAR) 50 MG tablet Take 1 tablet (50 mg total) by mouth daily. 180 tablet 0  ? metFORMIN (GLUCOPHAGE-XR) 500 MG 24 hr tablet Take 2 tablets (1,000 mg total) by mouth daily with supper. 180 tablet 3  ? methocarbamol (ROBAXIN) 500 MG tablet Take 1 tablet (500 mg total) by mouth every 8 (eight) hours as needed for muscle spasms. 30 tablet 1  ? montelukast (SINGULAIR) 10 MG tablet Take 1 tablet (10 mg total) by mouth at bedtime. 180 tablet 0  ? ONETOUCH DELICA LANCETS 33G MISC OneTouch Delica Lancets 33 gauge    ? ?No facility-administered medications prior to visit.  ? ? ?Review of Systems ?Per hpi  ? ?   ?Objective:  ?  ?BP 126/68     Pulse (!) 105   Temp 98 ?F (36.7 ?C) (Temporal)   Resp 18   Ht 5' 9" (1.753 m)   SpO2 96%   BMI 22.77 kg/m?  ?Physical Exam ?Constitutional:   ?   General: She is not in acute distress. ?   Appearance: Normal appearance. She is normal weight. She is not ill-appearing, toxic-appearing or diaphoretic.  ?HENT:  ?   Head: Normocephalic and atraumatic.  ?Skin: ?   Coloration: Skin is not jaundiced or pale.  ?   Findings: No bruising, erythema, lesion or rash.  ?Neurological:  ?   Mental Status: She is alert.  ?Psychiatric:     ?   Mood and Affect: Mood normal.     ?   Behavior: Behavior normal.     ?   Thought Content: Thought content normal.     ?   Judgment: Judgment normal.  ? ? ?Results for orders placed or performed in visit on 01/07/22  ?POCT urinalysis dipstick  ?Result Value Ref  Range  ? Color, UA yellow yellow  ? Clarity, UA clear clear  ? Glucose, UA negative negative mg/dL  ? Bilirubin, UA negative negative  ? Ketones, POC UA negative negative mg/dL  ? Spec Grav, UA >=1.030 (A) 1.010 - 1.025  ? Blood, UA small (A) negative  ? pH, UA 6.0 5.0 - 8.0  ? Protein Ur, POC trace (A) negative mg/dL  ? Urobilinogen, UA 0.2 0.2 or 1.0 E.U./dL  ? Nitrite, UA Negative Negative  ? Leukocytes, UA Negative Negative  ? ? ? ?   ?Assessment & Plan:  ?1. Hematuria, unspecified type ?- POCT urinalysis dipstick ?- Urine Culture ?- sulfamethoxazole-trimethoprim (BACTRIM DS) 800-160 MG tablet; Take 1 tablet by mouth 2 (two) times daily.  Dispense: 6 tablet; Refill: 0 ? ? ? ?Meds ordered this encounter  ?Medications  ? sulfamethoxazole-trimethoprim (BACTRIM DS) 800-160 MG tablet  ?  Sig: Take 1 tablet by mouth 2 (two) times daily.  ?  Dispense:  6 tablet  ?  Refill:  0  ?  Order Specific Question:   Supervising Provider  ?  Answer:   GREENE, JEFFREY R [[id number]]  ? ? ?Return if symptoms worsen or fail to improve. ? ?PLAN ?POCT UA indeterminate for UTI, but will treat with bactrim based on symptoms ?Culture sent, will follow up as indicated. ?Patient encouraged to call clinic with any questions, comments, or concerns. ? ? ?Richard Morrow, NP ?

## 2022-01-08 ENCOUNTER — Ambulatory Visit: Payer: BC Managed Care – PPO | Admitting: Family Medicine

## 2022-01-08 LAB — URINE CULTURE
MICRO NUMBER:: 13340126
Result:: NO GROWTH
SPECIMEN QUALITY:: ADEQUATE

## 2022-01-10 ENCOUNTER — Ambulatory Visit: Payer: BC Managed Care – PPO | Admitting: Family Medicine

## 2022-01-10 ENCOUNTER — Encounter: Payer: Self-pay | Admitting: Family Medicine

## 2022-01-10 VITALS — BP 106/78 | HR 103 | Temp 98.8°F | Resp 17 | Ht 69.0 in | Wt 146.2 lb

## 2022-01-10 DIAGNOSIS — R7989 Other specified abnormal findings of blood chemistry: Secondary | ICD-10-CM

## 2022-01-10 DIAGNOSIS — R6883 Chills (without fever): Secondary | ICD-10-CM

## 2022-01-10 DIAGNOSIS — R319 Hematuria, unspecified: Secondary | ICD-10-CM

## 2022-01-10 DIAGNOSIS — R1111 Vomiting without nausea: Secondary | ICD-10-CM

## 2022-01-10 DIAGNOSIS — R350 Frequency of micturition: Secondary | ICD-10-CM

## 2022-01-10 LAB — BASIC METABOLIC PANEL
BUN: 28 mg/dL — ABNORMAL HIGH (ref 6–23)
CO2: 31 mEq/L (ref 19–32)
Calcium: 9.4 mg/dL (ref 8.4–10.5)
Chloride: 95 mEq/L — ABNORMAL LOW (ref 96–112)
Creatinine, Ser: 1.41 mg/dL — ABNORMAL HIGH (ref 0.40–1.20)
GFR: 39.7 mL/min — ABNORMAL LOW (ref >60.00)
Glucose, Bld: 153 mg/dL — ABNORMAL HIGH (ref 70–99)
Potassium: 4.5 mEq/L (ref 3.5–5.1)
Sodium: 135 mEq/L (ref 135–145)

## 2022-01-10 LAB — HEPATIC FUNCTION PANEL
ALT: 14 U/L (ref 0–35)
AST: 16 U/L (ref 0–37)
Albumin: 4.1 g/dL (ref 3.5–5.2)
Alkaline Phosphatase: 55 U/L (ref 39–117)
Bilirubin, Direct: 0.1 mg/dL (ref 0.0–0.3)
Total Bilirubin: 0.4 mg/dL (ref 0.2–1.2)
Total Protein: 6.9 g/dL (ref 6.0–8.3)

## 2022-01-10 LAB — POCT URINALYSIS DIPSTICK
Bilirubin, UA: NEGATIVE
Blood, UA: POSITIVE
Glucose, UA: NEGATIVE
Ketones, UA: NEGATIVE
Leukocytes, UA: NEGATIVE
Nitrite, UA: NEGATIVE
Protein, UA: NEGATIVE
Spec Grav, UA: >=1.03 — AB (ref 1.010–1.025)
Urobilinogen, UA: 0.2 E.U./dL
pH, UA: 5 (ref 5.0–8.0)

## 2022-01-10 LAB — CBC WITH DIFFERENTIAL/PLATELET
Basophils Absolute: 0 10*3/uL (ref 0.0–0.1)
Basophils Relative: 0.4 % (ref 0.0–3.0)
Eosinophils Absolute: 0.6 10*3/uL (ref 0.0–0.7)
Eosinophils Relative: 8.1 % — ABNORMAL HIGH (ref 0.0–5.0)
HCT: 37.8 % (ref 36.0–46.0)
Hemoglobin: 12.5 g/dL (ref 12.0–15.0)
Lymphocytes Relative: 10.8 % — ABNORMAL LOW (ref 12.0–46.0)
Lymphs Abs: 0.8 10*3/uL (ref 0.7–4.0)
MCHC: 32.9 g/dL (ref 30.0–36.0)
MCV: 87.4 fl (ref 78.0–100.0)
Monocytes Absolute: 1.1 10*3/uL — ABNORMAL HIGH (ref 0.1–1.0)
Monocytes Relative: 15.3 % — ABNORMAL HIGH (ref 3.0–12.0)
Neutro Abs: 4.7 10*3/uL (ref 1.4–7.7)
Neutrophils Relative %: 65.4 % (ref 43.0–77.0)
Platelets: 287 10*3/uL (ref 150.0–400.0)
RBC: 4.33 Mil/uL (ref 3.87–5.11)
RDW: 14.1 % (ref 11.5–15.5)
WBC: 7.1 10*3/uL (ref 4.0–10.5)

## 2022-01-10 LAB — POC COVID19 BINAXNOW: SARS Coronavirus 2 Ag: NEGATIVE

## 2022-01-10 LAB — POCT INFLUENZA A/B
Influenza A, POC: NEGATIVE
Influenza B, POC: NEGATIVE

## 2022-01-10 NOTE — Patient Instructions (Addendum)
Follow up as needed or as scheduled ?We'll notify you of your lab results and make any changes if needed ?We'll call you to schedule your CT scan ?Continue to drink LOTS of fluids ?REST! ?Alternate tylenol/ibuprofen for chills, headaches, body aches ?STOP the Bactrim- I have added it to your allergy list ?Call with any questions or concerns ?Hang in there!!  We'll figure this out!! ?

## 2022-01-10 NOTE — Progress Notes (Signed)
? ?Subjective:  ? ? Patient ID: Amber Washington, female    DOB: December 03, 1955, 66 y.o.   MRN: [id number] ? ?HPI ?UTI- pt was seen on 5/2 and was treated w/ Bactrim.  But culture came back as no growth.  Pt reports faint blood when wiping 'almost 2 weeks ago'.  Then developed increased urinary frequency.  Saturday night woke w/ suprapubic pressure/pain.  On Saturday had 'a good bit of blood' and possible clots.  Had blood x3 and then urine was clear.  After starting Bactrim on Tuesday she developed cold chills but no fevers.  Yesterday started vomiting.  Took Meclizine to improve the vomiting.  Has not vomited since yesterday.  At this point, denies urinary sxs. ? ?Pt denies cough, congestion, sore throat, HAs above baseline.  No diarrhea. ? ? ?Review of Systems ?For ROS see HPI  ?   ?Objective:  ? Physical Exam ?Vitals reviewed.  ?Constitutional:   ?   Appearance: Normal appearance. She is ill-appearing. She is not diaphoretic.  ?   Comments: Shaking chills  ?HENT:  ?   Head: Normocephalic and atraumatic.  ?   Right Ear: Tympanic membrane and ear canal normal.  ?   Left Ear: Tympanic membrane and ear canal normal.  ?   Nose: No congestion or rhinorrhea.  ?Eyes:  ?   Extraocular Movements: Extraocular movements intact.  ?   Conjunctiva/sclera: Conjunctivae normal.  ?   Pupils: Pupils are equal, round, and reactive to light.  ?Cardiovascular:  ?   Rate and Rhythm: Regular rhythm. Tachycardia present.  ?   Pulses: Normal pulses.  ?Pulmonary:  ?   Effort: Pulmonary effort is normal. No respiratory distress.  ?   Breath sounds: Normal breath sounds. No wheezing or rhonchi.  ?Abdominal:  ?   General: Abdomen is flat. There is no distension.  ?   Palpations: Abdomen is soft.  ?   Tenderness: There is no abdominal tenderness. There is no guarding or rebound.  ?Musculoskeletal:  ?   Cervical back: Neck supple. No rigidity.  ?Lymphadenopathy:  ?   Cervical: No cervical adenopathy.  ?Skin: ?   General: Skin is warm and dry.  ?    Coloration: Skin is not jaundiced.  ?Neurological:  ?   General: No focal deficit present.  ?   Mental Status: She is alert and oriented to person, place, and time.  ?   Motor: No weakness.  ?   Coordination: Coordination normal.  ?Psychiatric:     ?   Mood and Affect: Mood normal.     ?   Behavior: Behavior normal.     ?   Thought Content: Thought content normal.  ? ? ? ? ? ?   ?Assessment & Plan:  ? ?Frequent urination/hematuria- given pt's description of sxs, it sounds like she may have passed a stone.  She reports she had ~3 hrs of pain, blood, urgency and then it resolved.  Urine culture was negative.  Will get CT stone study to see if any other stones are present. ? ?Shaking chills- new.  These did not start until after she started the Bactrim.  Unlikely to be pyelo given the negative urine culture.  Negative for COVID and flu.  Suspect this is a rxn to Bactrim and added medication to allergy list.  Check CBC to assess for bacterial infxn. ? ?N/V- new.  Suspect this is also rxn to Bactrim.  She did not take her last dose of Bactrim yesterday  and vomiting has stopped.  Check electrolytes.  Zofran prn. ?

## 2022-01-12 ENCOUNTER — Encounter: Payer: Self-pay | Admitting: Family Medicine

## 2022-01-13 ENCOUNTER — Telehealth: Payer: Self-pay

## 2022-01-13 ENCOUNTER — Other Ambulatory Visit (HOSPITAL_COMMUNITY): Payer: Self-pay

## 2022-01-13 NOTE — Telephone Encounter (Signed)
-----   Message from Midge Minium, MD sent at 01/12/2022 11:19 AM EDT ----- ?Labs look good w/ exception of increased creatinine (which means decreased kidney function).  Please make sure you are drinking LOTS of water and we'll repeat your BMP at a lab only visit this week ?

## 2022-01-13 NOTE — Telephone Encounter (Signed)
Spoke w/ pt and informed her of her lab results . She expressed verbal understanding . I have scheduled her Lab visit only to recheck her BMP on Thursday 01/16/22 @ 9 am . Pt is aware . ?

## 2022-01-14 ENCOUNTER — Other Ambulatory Visit (HOSPITAL_COMMUNITY): Payer: Self-pay

## 2022-01-15 ENCOUNTER — Ambulatory Visit
Admission: RE | Admit: 2022-01-15 | Discharge: 2022-01-15 | Disposition: A | Discharge status: Home / Self Care | Payer: BC Managed Care – PPO | Source: Ambulatory Visit | Attending: Family Medicine | Admitting: Family Medicine

## 2022-01-15 ENCOUNTER — Encounter: Payer: Self-pay | Admitting: Family Medicine

## 2022-01-15 DIAGNOSIS — R319 Hematuria, unspecified: Secondary | ICD-10-CM

## 2022-01-15 DIAGNOSIS — R6883 Chills (without fever): Secondary | ICD-10-CM

## 2022-01-16 ENCOUNTER — Other Ambulatory Visit (HOSPITAL_COMMUNITY): Payer: Self-pay

## 2022-01-16 ENCOUNTER — Other Ambulatory Visit (INDEPENDENT_AMBULATORY_CARE_PROVIDER_SITE_OTHER): Payer: BC Managed Care – PPO

## 2022-01-16 DIAGNOSIS — R7989 Other specified abnormal findings of blood chemistry: Secondary | ICD-10-CM

## 2022-01-16 LAB — BASIC METABOLIC PANEL
BUN: 20 mg/dL (ref 6–23)
CO2: 31 mEq/L (ref 19–32)
Calcium: 9.5 mg/dL (ref 8.4–10.5)
Chloride: 101 mEq/L (ref 96–112)
Creatinine, Ser: 0.94 mg/dL (ref 0.40–1.20)
GFR: 64.57 mL/min (ref >60.00)
Glucose, Bld: 147 mg/dL — ABNORMAL HIGH (ref 70–99)
Potassium: 4.5 mEq/L (ref 3.5–5.1)
Sodium: 139 mEq/L (ref 135–145)

## 2022-01-16 NOTE — Telephone Encounter (Signed)
-----   Message from Midge Minium, MD sent at 01/16/2022  1:54 PM EDT ----- ?Thankfully no stones noted.  How are you feeling? ?

## 2022-01-17 ENCOUNTER — Encounter: Payer: Self-pay | Admitting: Family Medicine

## 2022-01-30 ENCOUNTER — Ambulatory Visit (INDEPENDENT_AMBULATORY_CARE_PROVIDER_SITE_OTHER): Payer: BC Managed Care – PPO | Admitting: Internal Medicine

## 2022-01-30 ENCOUNTER — Encounter: Payer: Self-pay | Admitting: Internal Medicine

## 2022-01-30 VITALS — BP 130/82 | HR 69 | Ht 69.0 in | Wt 147.8 lb

## 2022-01-30 DIAGNOSIS — Z794 Long term (current) use of insulin: Secondary | ICD-10-CM | POA: Diagnosis not present

## 2022-01-30 DIAGNOSIS — E0865 Diabetes mellitus due to underlying condition with hyperglycemia: Secondary | ICD-10-CM

## 2022-01-30 DIAGNOSIS — Z91410 Personal history of adult physical and sexual abuse: Secondary | ICD-10-CM

## 2022-01-30 DIAGNOSIS — E7800 Pure hypercholesterolemia, unspecified: Secondary | ICD-10-CM

## 2022-01-30 DIAGNOSIS — E1365 Other specified diabetes mellitus with hyperglycemia: Secondary | ICD-10-CM

## 2022-01-30 LAB — POCT GLYCOSYLATED HEMOGLOBIN (HGB A1C): Hemoglobin A1C: 6.3 % — AB (ref 4.0–5.6)

## 2022-01-30 NOTE — Patient Instructions (Addendum)
Please continue: - Metformin ER 500 mg 2x a day - Januvia 100 mg before b'fast - Basaglar 8 units in a.m. - NovoLog 3-5 units before dinner and lunch    Use the following sliding scale for NovoLog: - 130-180: + 1 unit  - 181-230: + 2 units  - 231-280: + 3 units  - >281: + 4 units   Please return in 4 months

## 2022-01-30 NOTE — Progress Notes (Signed)
Patient ID: Amber Washington, female   DOB: 27-May-1956, 66 y.o.   MRN: [id number]   This visit occurred during the SARS-CoV-2 public health emergency.  Safety protocols were in place, including screening questions prior to the visit, additional usage of staff PPE, and extensive cleaning of exam room while observing appropriate contact time as indicated for disinfecting solutions.   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  Last visit 4 months ago.  Interim history: No increased urination, blurry vision, nausea, chest pain. She recently had chills and hematuria earlier this month. She had a UTI >> Bactrim >> intolerance.  Reviewed HbA1c levels: Lab Results  Component Value Date   HGBA1C 6.8 (A) 10/01/2021   HGBA1C 6.4 (A) 05/23/2021   HGBA1C 6.9 (A) 01/09/2021   She is on: - Januvia 100 mg before breakfast - Metformin ER 500 >> 1000 mg twice a day with meals (diarrhea) >> .Marland KitchenMarland Kitchen 1000 mg with dinner >> 500 mg 2x a day (b/c loose stools) - Basaglar 5 units daily at bedtime >> ...8 >> 10 >> 8 units in a.m. - Novolog  6-8 >> 3-4 units before dinner and occasionally lunch She was on Jardiance 25 mg before b'fast >> yeast inf's >> stopped.  Pt checks sugars >4 times a day with her CGM - this is very expensive, more than $200 for 3 months for her:  Prev: - am: 88-126, 134 >> 63, 86-130, 134, 138 >> 93-133, 153, 157 - 2h after b'fast: n/c >> 162 >> n/c >> 217 >> 96-162, 195 >> n/c - before lunch:  76-129 >> 87-147, 165 (snack) >> 85-154, 167, 230 - 2h after lunch:  174, 179 >> n/c >> n/c >> 100-280 >> 85, 172 - before dinner: 128-154 >> 88-170, 197 >> 101-215, 264 (snack) - 2h after dinner: n/c >> 61-161, 175 >> 75-160 >> 54 x1, 112, 180 - bedtime:  n/c >> 129 >> n/c >> 157, 160, 201 >> 103-160 >> n/c - nighttime:  got glu  tablets >> n/c Lowest: 55 >> 58 x 1 >> 54 (after dinner). Highest:217 >> 280 >> 302 (with steroids)  Glucometer: Bayer Contour  Pt's meals are: - Breakfast: oatmeal + yoghurt + crackers - Lunch: salads + meat/cheese/crackers + fruit - Dinner: chicken + veggies or salad - Snacks: nuts , crackers, carrots - 1-x a day She only drinks water. She is walking for exercise.  No CKD, last BUN/creatinine:  Lab Results  Component Value Date   BUN 20 01/16/2022   BUN 28 (H) 01/10/2022   CREATININE 0.94 01/16/2022   CREATININE 1.41 (H) 01/10/2022   Normal ACR: Lab Results  Component Value Date   MICRALBCREAT 6.2 12/28/2019   MICRALBCREAT 3.4 06/28/2012   MICRALBCREAT 2.3 01/27/2011   MICRALBCREAT 3.2 02/05/2010  On losartan.  + HL; last set of lipids: Lab Results  Component Value Date   CHOL 137 07/03/2021   HDL 45.30 07/03/2021   LDLCALC 64 07/03/2021   TRIG 136.0 07/03/2021   CHOLHDL 3 07/03/2021  On Lipitor.  - last eye exam was on 09/2021: No DR reportedly  -she denies numbness and tingling in her feet.  She has a history of HTN, hematuria, chronic insomnia-Xanax helping.  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  ROS: + see HPI  I reviewed pt's medications, allergies, PMH, social hx, family hx, and changes were documented in the history of present illness. Otherwise, unchanged from my initial visit note.  Past Medical History:  Diagnosis Date   Allergic rhinitis    Anxiety state, unspecified    Asthma    Benign neoplasm of pancreas, except islets of Langerhans    Borderline hypertension    Hypercholesteremia    Malignant neoplasm of breast (female), unspecified site    Osteoporosis, unspecified    Personal history of chemotherapy    Personal history of radiation therapy    Stenosis of nasolacrimal duct, acquired    Type II or unspecified type diabetes mellitus without mention of complication, not stated  as uncontrolled    Past Surgical History:  Procedure Laterality Date   BREAST BIOPSY     BREAST EXCISIONAL BIOPSY     BREAST LUMPECTOMY     [id number] 1995   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   NASAL SEPTUM SURGERY     right breast cancer sugery  1995   surgery on neck for removal of birthmark     tur induction     Social History   Social History   Marital status: Married    Spouse name: N/A   Number of children: 2   Occupational History   Radiation protection practitioner   Social History Main Topics   Smoking status: Never Smoker   Smokeless tobacco: Never Used   Alcohol use No   Drug use: No   Current Outpatient Medications on File Prior to Visit  Medication Sig Dispense Refill   Accu-Chek Softclix Lancets lancets Use to check BS 3x a day 200 each 12   albuterol (VENTOLIN HFA) 108 (90 Base) MCG/ACT inhaler Inhale 2 puffs into the lungs every 6 (six) hours as needed for wheezing or shortness of breath. 1 Inhaler 2   ALPRAZolam (XANAX) 0.5 MG tablet TAKE ONE TABLET BY MOUTH THREE TIMES DAILY AS NEEDED FOR SLEEP OR FOR ANXIETY 90 tablet 1   aspirin 81 MG tablet Take 81 mg by mouth daily.     atorvastatin (LIPITOR) 10 MG tablet TAKE 1 TABLET DAILY (MYLAN) 90 tablet 1   BAYER MICROLET LANCETS lancets Use 1-2x a DAY  - for Bayer Contour 100 each 12   Blood Glucose Monitoring Suppl (ACCU-CHEK GUIDE) w/Device KIT Use as directed 1 kit 0   Cholecalciferol (VITAMIN D) 2000 UNITS CAPS Take 1 capsule by mouth daily.     Continuous Blood Gluc Sensor (FREESTYLE LIBRE 3 SENSOR) MISC USE 1 SENSOR EVERY 14 DAYS. 6 each 3   cyclobenzaprine (FLEXERIL) 10 MG tablet Take 1 tablet (10 mg total) by mouth at bedtime. (Patient not taking: Reported on 01/10/2022) 30 tablet 0   glucose blood (ACCU-CHEK GUIDE) test strip Use to check BS 3x a day 300 each 12   glucose blood test strip Use as instructed to check blood sugar 1-2 times daily. 200 each 12   insulin aspart (NOVOLOG)  100 UNIT/ML injection Inject 4-6 units into the skin before dinner (Patient taking differently: Inject 2 Units into the skin. Inject 4-6 units into the skin before dinner) 15 mL 3   Insulin Glargine (BASAGLAR KWIKPEN) 100 UNIT/ML Inject 8 Units into the skin daily. 15 mL 3   Insulin Pen Needle (BD PEN NEEDLE NANO U/F) 32G X 4 MM MISC USE 2 TO 3 TIMES A DAY 200 each 3   JANUVIA 100 MG tablet TAKE 1  TABLET BY MOUTH DAILY. 30 tablet 11   losartan (COZAAR) 50 MG tablet Take 1 tablet (50 mg total) by mouth daily. 180 tablet 0   metFORMIN (GLUCOPHAGE-XR) 500 MG 24 hr tablet Take 2 tablets (1,000 mg total) by mouth daily with supper. 180 tablet 3   methocarbamol (ROBAXIN) 500 MG tablet Take 1 tablet (500 mg total) by mouth every 8 (eight) hours as needed for muscle spasms. (Patient not taking: Reported on 01/10/2022) 30 tablet 1   montelukast (SINGULAIR) 10 MG tablet Take 1 tablet (10 mg total) by mouth at bedtime. 180 tablet 0   ONETOUCH DELICA LANCETS 65H MISC OneTouch Delica Lancets 33 gauge     sulfamethoxazole-trimethoprim (BACTRIM DS) 800-160 MG tablet Take 1 tablet by mouth 2 (two) times daily. 6 tablet 0   No current facility-administered medications on file prior to visit.   Allergies  Allergen Reactions   Influenza Vaccines Other (See Comments)    Unknown, allergic to egg whites   Bactrim [Sulfamethoxazole-Trimethoprim] Nausea And Vomiting and Other (See Comments)    Shaking chills   Eggs Or Egg-Derived Products    FH: - mother - leukemia, father - liver cancer - mother - HTN - DM in nephew.  PE: BP 130/82 (BP Location: Right Arm, Patient Position: Sitting, Cuff Size: Normal)   Pulse 69   Ht 5' 9"  (1.753 m)   Wt 147 lb 12.8 oz (67 kg)   SpO2 99%   BMI 21.83 kg/m   Wt Readings from Last 3 Encounters:  01/30/22 147 lb 12.8 oz (67 kg)  01/10/22 146 lb 3.2 oz (66.3 kg)  10/01/21 154 lb 3.2 oz (69.9 kg)   Constitutional: normal weight, in NAD Eyes: PERRLA, EOMI, no exophthalmos, +  arcus cornealis ENT: moist mucous membranes, no thyromegaly, no cervical lymphadenopathy Cardiovascular: RRR, No MRG Respiratory: CTA B Musculoskeletal: no deformities, strength intact in all 4 Skin: moist, warm, no rashes Neurological: no tremor with outstretched hands, DTR normal in all 4 Diabetic Foot Exam - Simple   Simple Foot Form Diabetic Foot exam was performed with the following findings: Yes 01/30/2022  8:47 AM  Visual Inspection No deformities, no ulcerations, no other skin breakdown bilaterally: Yes Sensation Testing Intact to touch and monofilament testing bilaterally: Yes Pulse Check Posterior Tibialis and Dorsalis pulse intact bilaterally: Yes Comments    ASSESSMENT: 1. DM, insulin-dependent, previously uncontrolled, without long term complications, but with hyperglycemia  Component     Latest Ref Rng & Units 12/04/2016  C-Peptide     0.80 - 3.85 ng/mL 0.94  Glucose, Fasting     65 - 99 mg/dL 160 (H)  C-peptide is not very high, but is still in the normal range.   She has a history of distal pancreatic resection (?%) For a benign cyst and she developed diabetes soon after the surgery.  2. HL  PLAN:  1. Patient with history of diabetes due to distal pancreatectomy for pancreatic cyst.  Sugars improved after adding low-dose basal insulin but then worsened again so we had to add NovoLog, which she is now using before dinner and lunch as needed.  We also have her on metformin ER and DPP4 inhibitor.  She is doing a good job with her diet, eating healthy starches, and limiting her carbs otherwise.  At last visit, sugars are under excellent control in the morning but they were increasing later in the day with occasional spikes in the 180s to 200s.  Upon questioning, she occasionally had a snack  in the afternoon before this checks.  She was taking 3 to 4 units of NovoLog before each meal and we discussed about varying the dose more.  We had to back off the metformin slightly  due to loose stools.  I also suggested a CGM.  HbA1c at that time was slightly higher at 6.8%. CGM interpretation: -At today's visit, we reviewed her CGM downloads: It appears that 90% of values are in target range (goal >70%), while 10% are higher than 180 (goal <25%), and 0% are lower than 70 (goal <4%).  The calculated average blood sugar is 134.  The projected HbA1c for the next 3 months (GMI) is 6.5%. -Reviewing the CGM trends, sugars are well controlled during the night, but they increase after breakfast -usually under 180, and after lunch, frequently above 180.  After dinner, sugars are well controlled.  Since last visit, she switched from taking metformin with dinner to taking it twice a day with better GI tolerance.  She takes NovoLog consistently before dinner but not frequently before lunch.  When she takes it before this meal, she usually takes a lower dose.  Reviewing her psoriasis, we discussed that she may need to take NovoLog with lunch more frequently, especially when she has meals with more carbs.  Yesterday, for example, her sugars increase significantly despite taking 2 to 3 units of NovoLog but upon reviewing this meal, she had a rise sandwich and chips.  We discussed that fried foods can greatly increase her blood sugars. -At this visit, I also suggested a NovoLog sliding scale.  We will start this with very conservative parameters: Target 130 and insulin sensitivity factor 50.  Otherwise, we can continue the same regimen. - I suggested to:  Patient Instructions  Please continue: - Metformin ER 500 mg 2x a day - Januvia 100 mg before b'fast - Basaglar 8 units in a.m. - NovoLog 3-5 units before dinner and lunch    Use the following sliding scale for NovoLog: - 130-180: + 1 unit  - 181-230: + 2 units  - 231-280: + 3 units  - >281: + 4 units   Please return in 4 months   - we checked her HbA1c: 6.3% (lower)  - advised to check sugars at different times of the day - 4x a day,  rotating check times - advised for yearly eye exams >> she is UTD - return to clinic in 4 months  2. HL -She has arcus cornealis -Reviewed latest lipid panel from 06/2021: LDL at goal, as are the rest of the fractions: Lab Results  Component Value Date   CHOL 137 07/03/2021   HDL 45.30 07/03/2021   LDLCALC 64 07/03/2021   TRIG 136.0 07/03/2021   CHOLHDL 3 07/03/2021  -She continues on Lipitor 10 mg daily without side effects  Philemon Kingdom, MD PhD [facility]

## 2022-01-31 ENCOUNTER — Other Ambulatory Visit: Payer: Self-pay | Admitting: Internal Medicine

## 2022-01-31 DIAGNOSIS — E1165 Type 2 diabetes mellitus with hyperglycemia: Secondary | ICD-10-CM

## 2022-03-26 ENCOUNTER — Encounter: Payer: Self-pay | Admitting: Family Medicine

## 2022-03-26 DIAGNOSIS — J4521 Mild intermittent asthma with (acute) exacerbation: Secondary | ICD-10-CM

## 2022-03-26 MED ORDER — ALBUTEROL SULFATE HFA 108 (90 BASE) MCG/ACT IN AERS: 2.0000 | INHALATION_SPRAY | Freq: Four times a day (QID) | RESPIRATORY_TRACT | 2 refills | Status: DC | PRN

## 2022-03-26 MED ORDER — BUDESONIDE-FORMOTEROL FUMARATE 80-4.5 MCG/ACT IN AERO: 2.0000 | INHALATION_SPRAY | Freq: Two times a day (BID) | RESPIRATORY_TRACT | 3 refills | Status: DC

## 2022-03-26 NOTE — Telephone Encounter (Signed)
[facility] VISIT   Patient agreed to [facility] visit and is aware that copayment and coinsurance may apply. Patient was treated using telemedicine according to accepted telemedicine protocols.  Subjective:   Patient complains of SOB  Patient Active Problem List   Diagnosis Date Noted   Benign paroxysmal positional vertigo 05/14/2018   Bronchitis, asthmatic 11/22/2015   Insomnia 05/31/2014   GERD (gastroesophageal reflux disease) 11/02/2013   Physical exam, annual 02/05/2011   History of breast cancer 06/22/2008   BEN NEOPLASM PANCREAS EXCEPT ISLETS LANGERHANS 06/22/2008   ALLERGIC RHINITIS 06/22/2008   Type 2 diabetes mellitus with hyperglycemia, with long-term current use of insulin ([place]) 02/24/2008   HYPERCHOLESTEROLEMIA 02/24/2008   ANXIETY 02/24/2008   Essential hypertension 02/24/2008   Asthma 02/24/2008   Social History   Tobacco Use   Smoking status: Never   Smokeless tobacco: Never  Substance Use Topics   Alcohol use: Yes    Comment: social use    Current Outpatient Medications:    budesonide-formoterol (SYMBICORT) 80-4.5 MCG/ACT inhaler, Inhale 2 puffs into the lungs 2 (two) times daily., Disp: 1 each, Rfl: 3   Accu-Chek Softclix Lancets lancets, Use to check BS 3x a day, Disp: 200 each, Rfl: 12   albuterol (VENTOLIN HFA) 108 (90 Base) MCG/ACT inhaler, Inhale 2 puffs into the lungs every 6 (six) hours as needed for wheezing or shortness of breath., Disp: 1 each, Rfl: 2   ALPRAZolam (XANAX) 0.5 MG tablet, TAKE ONE TABLET BY MOUTH THREE TIMES DAILY AS NEEDED FOR SLEEP OR FOR ANXIETY, Disp: 90 tablet, Rfl: 1   aspirin 81 MG tablet, Take 81 mg by mouth daily., Disp: , Rfl:    atorvastatin (LIPITOR) 10 MG tablet, TAKE 1 TABLET DAILY (MYLAN), Disp: 90 tablet, Rfl: 1   BAYER MICROLET LANCETS lancets, Use 1-2x a DAY  - for [employer], Disp: 100 each, Rfl: 12   Blood Glucose Monitoring Suppl (ACCU-CHEK GUIDE) w/Device KIT, Use as directed, Disp: 1 kit, Rfl: 0    Cholecalciferol (VITAMIN D) 2000 UNITS CAPS, Take 1 capsule by mouth daily., Disp: , Rfl:    Continuous Blood Gluc Sensor (FREESTYLE LIBRE 3 SENSOR) MISC, USE 1 SENSOR EVERY 14 DAYS., Disp: 6 each, Rfl: 3   glucose blood (ACCU-CHEK GUIDE) test strip, Use to check BS 3x a day, Disp: 300 each, Rfl: 12   glucose blood test strip, Use as instructed to check blood sugar 1-2 times daily., Disp: 200 each, Rfl: 12   insulin aspart (NOVOLOG FLEXPEN) 100 UNIT/ML FlexPen, Inject under skin 3-5 units before dinner and lunch, Disp: 15 mL, Rfl: 3   Insulin Glargine (BASAGLAR KWIKPEN) 100 UNIT/ML, Inject 8 Units into the skin daily., Disp: 15 mL, Rfl: 3   Insulin Pen Needle (BD PEN NEEDLE NANO U/F) 32G X 4 MM MISC, USE 2 TO 3 TIMES A DAY, Disp: 200 each, Rfl: 3   JANUVIA 100 MG tablet, TAKE 1 TABLET BY MOUTH DAILY., Disp: 30 tablet, Rfl: 11   losartan (COZAAR) 50 MG tablet, Take 1 tablet (50 mg total) by mouth daily., Disp: 180 tablet, Rfl: 0   metFORMIN (GLUCOPHAGE-XR) 500 MG 24 hr tablet, Take 2 tablets (1,000 mg total) by mouth daily with supper., Disp: 180 tablet, Rfl: 3   montelukast (SINGULAIR) 10 MG tablet, Take 1 tablet (10 mg total) by mouth at bedtime., Disp: 180 tablet, Rfl: 0   ONETOUCH DELICA LANCETS 45X MISC, OneTouch Delica Lancets 33 gauge, Disp: , Rfl:   Allergies  Allergen Reactions  Influenza Vaccines Other (See Comments)    Unknown, allergic to egg whites   Bactrim [Sulfamethoxazole-Trimethoprim] Nausea And Vomiting and Other (See Comments)    Shaking chills   Eggs Or Egg-Derived Products     Assessment and Plan:   Diagnosis: Asthmatic bronchitis. Please see myChart communication and orders below.   No orders of the defined types were placed in this encounter.  Meds ordered this encounter  Medications   budesonide-formoterol (SYMBICORT) 80-4.5 MCG/ACT inhaler    Sig: Inhale 2 puffs into the lungs 2 (two) times daily.    Dispense:  1 each    Refill:  3   albuterol (VENTOLIN  HFA) 108 (90 Base) MCG/ACT inhaler    Sig: Inhale 2 puffs into the lungs every 6 (six) hours as needed for wheezing or shortness of breath.    Dispense:  1 each    Refill:  2    Annye Asa, MD 03/26/2022  A total of 5 minutes were spent by me to personally review the patient-generated inquiry, review patient records and data pertinent to assessment of the patient's problem, develop a management plan including generation of prescriptions and/or orders, and on subsequent communication with the patient through secure the MyChart portal service.   There is no separately reported E/M service related to this service in the past 7 days nor does the patient have an upcoming soonest available appointment for this issue. This work was completed in less than 7 days.   The patient consented to this service today (see patient agreement prior to ongoing communication). Patient counseled regarding the need for in-person exam for certain conditions and was advised to call the office if any changing or worsening symptoms occur.   The codes to be used for the E/M service are: _0   99421 for 5-10 minutes of time spent on the inquiry. _1   [id number] for 11-20 minutes. _2   X700321 for 21+ minutes.

## 2022-03-26 NOTE — Telephone Encounter (Signed)
Spoke to pt and she wants to try her inhaler and albuterol neb tx and if she is not getting any better she will make an virtual visit

## 2022-03-31 ENCOUNTER — Encounter: Payer: Self-pay | Admitting: Internal Medicine

## 2022-03-31 DIAGNOSIS — E0865 Diabetes mellitus due to underlying condition with hyperglycemia: Secondary | ICD-10-CM

## 2022-03-31 MED ORDER — FREESTYLE LIBRE 2 READER DEVI: 0 refills | Status: DC

## 2022-03-31 MED ORDER — FREESTYLE LIBRE 2 SENSOR MISC: 3 refills | Status: DC

## 2022-04-09 ENCOUNTER — Other Ambulatory Visit (HOSPITAL_COMMUNITY): Payer: Self-pay

## 2022-04-09 ENCOUNTER — Encounter: Payer: Self-pay | Admitting: Internal Medicine

## 2022-04-10 ENCOUNTER — Telehealth: Payer: Self-pay | Admitting: Pharmacy Technician

## 2022-04-10 ENCOUNTER — Other Ambulatory Visit (HOSPITAL_COMMUNITY): Payer: Self-pay

## 2022-04-10 NOTE — Telephone Encounter (Signed)
Patient Advocate Encounter  Prior Authorization has been approved.    PA# PA Case ID: [id number] Effective dates: 04/10/22 through 04/11/23  Per Test Claim Patients co-pay is $47 for 1 month, $141 for 3 months.   Didn't call pharmacy. Not sure if this price is better than what she's currently paying or not.

## 2022-04-10 NOTE — Telephone Encounter (Signed)
Pt notified via My Chart

## 2022-04-10 NOTE — Telephone Encounter (Signed)
Patient Advocate Encounter   Received notification from RMA/Office that prior authorization for Dexcom G7 Sensors is required/requested.   PA submitted on 04/10/22 to [place] via [place] Status is pending  Pharmacy Patient Advocate Fax:  [phone]

## 2022-04-11 ENCOUNTER — Other Ambulatory Visit (HOSPITAL_COMMUNITY): Payer: Self-pay

## 2022-04-11 MED ORDER — DEXCOM G7 RECEIVER DEVI: 0 refills | Status: DC

## 2022-04-11 MED ORDER — DEXCOM G7 SENSOR MISC: 3 refills | Status: DC

## 2022-04-11 NOTE — Addendum Note (Signed)
Addended by: Lauralyn Primes on: 04/11/2022 07:38 AM   Modules accepted: Orders

## 2022-05-15 ENCOUNTER — Other Ambulatory Visit: Payer: Self-pay | Admitting: Family Medicine

## 2022-05-15 ENCOUNTER — Other Ambulatory Visit: Payer: Self-pay | Admitting: Internal Medicine

## 2022-05-29 ENCOUNTER — Encounter: Payer: Self-pay | Admitting: Family Medicine

## 2022-05-29 MED ORDER — ALPRAZOLAM 0.5 MG PO TABS: ORAL_TABLET | ORAL | 1 refills | Status: DC

## 2022-06-17 ENCOUNTER — Ambulatory Visit (INDEPENDENT_AMBULATORY_CARE_PROVIDER_SITE_OTHER): Payer: BC Managed Care – PPO | Admitting: Internal Medicine

## 2022-06-17 ENCOUNTER — Encounter: Payer: Self-pay | Admitting: Internal Medicine

## 2022-06-17 VITALS — BP 128/78 | HR 76 | Ht 69.0 in | Wt 147.8 lb

## 2022-06-17 DIAGNOSIS — E0865 Diabetes mellitus due to underlying condition with hyperglycemia: Secondary | ICD-10-CM

## 2022-06-17 DIAGNOSIS — E1365 Other specified diabetes mellitus with hyperglycemia: Secondary | ICD-10-CM

## 2022-06-17 DIAGNOSIS — Z794 Long term (current) use of insulin: Secondary | ICD-10-CM | POA: Diagnosis not present

## 2022-06-17 DIAGNOSIS — E7800 Pure hypercholesterolemia, unspecified: Secondary | ICD-10-CM | POA: Diagnosis not present

## 2022-06-17 LAB — POCT GLYCOSYLATED HEMOGLOBIN (HGB A1C): Hemoglobin A1C: 6.2 % — AB (ref 4.0–5.6)

## 2022-06-17 MED ORDER — RYBELSUS 7 MG PO TABS: 7.0000 mg | ORAL_TABLET | Freq: Every day | ORAL | 5 refills | Status: DC

## 2022-06-17 NOTE — Patient Instructions (Addendum)
Please continue: - Metformin ER 500 mg 2x a day - Januvia 100 mg before b'fast  - but try to change to Rybelsus 7 mg daily (start at 3.5 mg daily) - Basaglar 8 units in a.m. - NovoLog 3-5 units before dinner and lunch   Also, use the following sliding scale for NovoLog: - 130-180: + 1 unit  - 181-230: + 2 units  - 231-280: + 3 units  - >281: + 4 units   Please return in 4 months

## 2022-06-17 NOTE — Progress Notes (Signed)
Patient ID: Amber Washington, female   DOB: Sep 30, 1955, 66 y.o.   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  Last visit 4 months ago.  Interim history: No increased urination, blurry vision, nausea, chest pain.  Reviewed HbA1c levels: Lab Results  Component Value Date   HGBA1C 6.3 (A) 01/30/2022   HGBA1C 6.8 (A) 10/01/2021   HGBA1C 6.4 (A) 05/23/2021   She is on: - Januvia 100 mg before breakfast - Metformin ER 500 >> 1000 mg twice a day with meals (diarrhea) >> .Marland KitchenMarland Kitchen 1000 mg with dinner >> 500 mg 2x a day (b/c loose stools) - Basaglar 5 units daily at bedtime >> ...8 >> 10 >> 8 units in a.m. - Novolog  6-8 >> 3-4 units before dinner and occasionally lunch >> 3-5 units before lunch and dinner + Sliding scale: - 130-180: + 1 unit  - 181-230: + 2 units  - 231-280: + 3 units  - >281: + 4 units  She was on Jardiance 25 mg before b'fast >> yeast inf's >> stopped.  Pt checks sugars >4 times a day with her CGM - now Dexcom G7. She deviously had a freestyle libre CGM but this was very expensive, >$200 for 3 months for her:  Previously:   Lowest: 55 >> 58 x 1 >> 54 (after dinner) >> 50s. Highest:217 >> 280 >> 302 (with steroids) >> 200s.  Glucometer: [employer]  Pt's meals are: - Breakfast: oatmeal + yoghurt + crackers - Lunch: salads + meat/cheese/crackers + fruit - Dinner: chicken + veggies or salad - Snacks: nuts , crackers, carrots - 1-x a day She only drinks water. She is walking for exercise.  No CKD, last BUN/creatinine:  Lab Results  Component Value Date   BUN 20 01/16/2022   BUN 28 (H) 01/10/2022   CREATININE 0.94 01/16/2022   CREATININE 1.41 (H) 01/10/2022   Normal ACR: Lab Results  Component Value Date   MICRALBCREAT 6.2 12/28/2019   MICRALBCREAT 3.4 06/28/2012    MICRALBCREAT 2.3 01/27/2011   MICRALBCREAT 3.2 02/05/2010  On losartan.  + HL; last set of lipids: Lab Results  Component Value Date   CHOL 137 07/03/2021   HDL 45.30 07/03/2021   LDLCALC 64 07/03/2021   TRIG 136.0 07/03/2021   CHOLHDL 3 07/03/2021  On Lipitor 10.  - last eye exam was on 09/2021: No DR reportedly  -she denies numbness and tingling in her feet.  Last foot exam 01/2022.  She has a history of HTN, hematuria, chronic insomnia-Xanax helping.  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  ROS: + see HPI  I reviewed pt's medications, allergies, PMH, social hx, family hx, and changes were documented in the history of present illness. Otherwise, unchanged from my initial visit note.  Past Medical History:  Diagnosis Date   Allergic rhinitis    Anxiety state, unspecified    Asthma    Benign neoplasm of pancreas, except islets of Langerhans    Borderline hypertension    Hypercholesteremia    Malignant neoplasm of breast (female), unspecified site    Osteoporosis, unspecified    Personal history of chemotherapy    Personal history of radiation therapy    Stenosis of nasolacrimal duct, acquired    Type II or unspecified type  diabetes mellitus without mention of complication, not stated as uncontrolled    Past Surgical History:  Procedure Laterality Date   BREAST BIOPSY     BREAST EXCISIONAL BIOPSY     BREAST LUMPECTOMY     [id number] 1995   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   NASAL SEPTUM SURGERY     right breast cancer sugery  1995   surgery on neck for removal of birthmark     tur induction     Social History   Social History   Marital status: Married    Spouse name: N/A   Number of children: 2   Occupational History   Radiation protection practitioner   Social History Main Topics   Smoking status: Never Smoker   Smokeless tobacco: Never Used   Alcohol use No   Drug use: No    Current Outpatient Medications on File Prior to Visit  Medication Sig Dispense Refill   Accu-Chek Softclix Lancets lancets Use to check BS 3x a day 200 each 12   albuterol (VENTOLIN HFA) 108 (90 Base) MCG/ACT inhaler Inhale 2 puffs into the lungs every 6 (six) hours as needed for wheezing or shortness of breath. 1 each 2   ALPRAZolam (XANAX) 0.5 MG tablet TAKE ONE TABLET BY MOUTH THREE TIMES DAILY AS NEEDED FOR SLEEP OR FOR ANXIETY 90 tablet 1   aspirin 81 MG tablet Take 81 mg by mouth daily.     atorvastatin (LIPITOR) 10 MG tablet TAKE 1 TABLET DAILY (MYLAN) 90 tablet 1   BAYER MICROLET LANCETS lancets Use 1-2x a DAY  - for [employer] Contour 100 each 12   Blood Glucose Monitoring Suppl (ACCU-CHEK GUIDE) w/Device KIT Use as directed 1 kit 0   budesonide-formoterol (SYMBICORT) 80-4.5 MCG/ACT inhaler Inhale 2 puffs into the lungs 2 (two) times daily. 1 each 3   Cholecalciferol (VITAMIN D) 2000 UNITS CAPS Take 1 capsule by mouth daily.     Continuous Blood Gluc Receiver (DEXCOM G7 RECEIVER) DEVI Use as instructed 1 each 0   Continuous Blood Gluc Sensor (DEXCOM G7 SENSOR) MISC Use as instructed to check blood sugar. Change every 10 days 9 each 3   glucose blood (ACCU-CHEK GUIDE) test strip Use to check BS 3x a day 300 each 12   glucose blood test strip Use as instructed to check blood sugar 1-2 times daily. 200 each 12   insulin aspart (NOVOLOG FLEXPEN) 100 UNIT/ML FlexPen Inject under skin 3-5 units before dinner and lunch 15 mL 3   Insulin Glargine (BASAGLAR KWIKPEN) 100 UNIT/ML Inject 8 Units into the skin daily. 15 mL 3   Insulin Pen Needle (BD PEN NEEDLE NANO U/F) 32G X 4 MM MISC USE 2 TO 3 TIMES A DAY 200 each 3   JANUVIA 100 MG tablet TAKE 1 TABLET BY MOUTH DAILY. 30 tablet 11   losartan (COZAAR) 50 MG tablet Take 1 tablet (50 mg total) by mouth daily. 180 tablet 0   metFORMIN (GLUCOPHAGE-XR) 500 MG 24 hr tablet Take 2 tablets (1,000 mg total) by mouth daily with supper. 180 tablet 3    montelukast (SINGULAIR) 10 MG tablet Take 1 tablet (10 mg total) by mouth at bedtime. 180 tablet 0   ONETOUCH DELICA LANCETS 17O MISC OneTouch Delica Lancets 33 gauge     No current facility-administered medications on file prior to visit.   Allergies  Allergen Reactions   Influenza Vaccines Other (See Comments)    Unknown, allergic to egg whites  Bactrim [Sulfamethoxazole-Trimethoprim] Nausea And Vomiting and Other (See Comments)    Shaking chills   Eggs Or Egg-Derived Products    FH: - mother - leukemia, father - liver cancer - mother - HTN - DM in nephew.  PE: BP 128/78 (BP Location: Right Arm, Patient Position: Sitting, Cuff Size: Normal)   Pulse 76   Ht 5' 9"  (1.753 m)   Wt 147 lb 12.8 oz (67 kg)   SpO2 99%   BMI 21.83 kg/m   Wt Readings from Last 3 Encounters:  06/17/22 147 lb 12.8 oz (67 kg)  01/30/22 147 lb 12.8 oz (67 kg)  01/10/22 146 lb 3.2 oz (66.3 kg)   Constitutional: normal weight, in NAD Eyes: EOMI, no exophthalmos, + arcus cornealis ENT: no thyromegaly, no cervical lymphadenopathy Cardiovascular: RRR, No MRG Respiratory: CTA B Musculoskeletal: no deformities Skin:  no rashes Neurological: no tremor with outstretched hands  ASSESSMENT: 1. DM, insulin-dependent, previously uncontrolled, without long term complications, but with hyperglycemia  Component     Latest Ref Rng & Units 12/04/2016  C-Peptide     0.80 - 3.85 ng/mL 0.94  Glucose, Fasting     65 - 99 mg/dL 160 (H)  C-peptide is not very high, but is still in the normal range.   She has a history of distal pancreatic resection (?%) For a benign cyst and she developed diabetes soon after the surgery.  2. HL  PLAN:  1. Patient with history of diabetes due to distal pancreatectomy for pancreatic cyst.  Sugars improved after adding low-dose basal insulin but then worsened so we had to add NovoLog, initially with dinner, then, at last visit, we also added this for lunch.  I also gave her a  sliding scale at last visit.  She is also on metformin ER (decreased dose due to loose stools) and DPP 4 inhibitor.  She is doing a great job with her diet, eating healthy starches, and limiting her carbs otherwise. -At last visit, sugars are well controlled during the night but they were increasing after breakfast, usually under 180, and after lunch, frequently above 180.  After dinner, sugars are well controlled.  I advised her to add NovoLog before lunch and we discussed about adjusting the dose for starches.  I also advised her that fried foods can greatly increase her blood sugars.  I gave her a sliding scale but continued the rest of the regimen.  HbA1c was 6.3%, decreased, excellent. CGM interpretation: -At today's visit, we reviewed her CGM downloads: It appears that 87% of values are in target range (goal >70%), while 12% are higher than 180 (goal <25%), and 1% are lower than 70 (goal <4%).  The calculated average blood sugar is 129.  The projected HbA1c for the next 3 months (GMI) is 6.4%. -Reviewing the CGM trends, it appears that her sugars are excellently controlled overnight, but they do increase occasionally after breakfast, lunch, and dinner, above 180, with the vast majority of the blood sugars at goal, though.  She does feel that after some meals, her blood sugars are dropping as if the insulin is kicking in sooner than expected and for some meals, the sugars remain high even if she injects correctly, 15 minutes before the meals.  We discussed that this may be related to the particular meal, and the fat content along with the glycemic index of the foods ingested, but it could be related to other factors including activity and stress.  She did have more stress since  last visit.  I advised her to do an experiment and when she eats the same meal over 2 or 3 days and use the same amount of insulin.  If the changes are consistent, we may need to change the insulin dose. -She does have occasional  lower blood sugars in the 50s and 60s.  She had more before changing from [facility] through the [facility] sensor.  She likes the fact that she cannot with the Dexcom sensor on the abdomen and is not falsely alarming her about low blood sugars when she laid on the arm with the sensor, as it happened with the freestyle libre CGM. -we discussed about the new study with GLP-1 receptor agonist and type 1 diabetes showing that he will for low doses of GLP-1 receptor agonist, blood sugars could improve while on this and patients may even come off mealtime insulin.  She agrees to try Rybelsus instead of Januvia, if covered by insurance.  I sent a prescription to her pharmacy. - I suggested to:  Patient Instructions  Please continue: - Metformin ER 500 mg 2x a day - Januvia 100 mg before b'fast  - but try to change to Rybelsus 7 mg daily (start at 3.5 mg daily) - Basaglar 8 units in a.m. - NovoLog 3-5 units before dinner and lunch   Also, use the following sliding scale for NovoLog: - 130-180: + 1 unit  - 181-230: + 2 units  - 231-280: + 3 units  - >281: + 4 units   Please return in 4 months   - we checked her HbA1c: 6.2% (lower) - advised to check sugars at different times of the day - 4x a day, rotating check times - advised for yearly eye exams >> she is UTD - she has an appointment coming up with Dr. Birdie Riddle later this year - return to clinic in 4 months  2. HL -She has arcus cornealis -Reviewed her latest lipid panel from a year ago: All fractions at goal: Lab Results  Component Value Date   CHOL 137 07/03/2021   HDL 45.30 07/03/2021   LDLCALC 64 07/03/2021   TRIG 136.0 07/03/2021   CHOLHDL 3 07/03/2021  -She is on Lipitor 10 mg daily without side effects -She is due for another lipid panel -appointment coming up with PCP  Philemon Kingdom, MD PhD [facility]

## 2022-07-09 ENCOUNTER — Encounter: Payer: Self-pay | Admitting: Internal Medicine

## 2022-07-09 ENCOUNTER — Ambulatory Visit (INDEPENDENT_AMBULATORY_CARE_PROVIDER_SITE_OTHER): Payer: BC Managed Care – PPO | Admitting: Family Medicine

## 2022-07-09 ENCOUNTER — Encounter: Payer: Self-pay | Admitting: Family Medicine

## 2022-07-09 ENCOUNTER — Other Ambulatory Visit: Payer: Self-pay | Admitting: Internal Medicine

## 2022-07-09 VITALS — BP 114/72 | HR 80 | Temp 97.5°F | Resp 17 | Ht 69.0 in | Wt 146.0 lb

## 2022-07-09 DIAGNOSIS — Z23 Encounter for immunization: Secondary | ICD-10-CM | POA: Diagnosis not present

## 2022-07-09 DIAGNOSIS — Z1211 Encounter for screening for malignant neoplasm of colon: Secondary | ICD-10-CM | POA: Diagnosis not present

## 2022-07-09 DIAGNOSIS — E0865 Diabetes mellitus due to underlying condition with hyperglycemia: Secondary | ICD-10-CM

## 2022-07-09 DIAGNOSIS — E1165 Type 2 diabetes mellitus with hyperglycemia: Secondary | ICD-10-CM

## 2022-07-09 DIAGNOSIS — Z794 Long term (current) use of insulin: Secondary | ICD-10-CM

## 2022-07-09 DIAGNOSIS — I10 Essential (primary) hypertension: Secondary | ICD-10-CM | POA: Diagnosis not present

## 2022-07-09 DIAGNOSIS — Z0000 Encounter for general adult medical examination without abnormal findings: Secondary | ICD-10-CM

## 2022-07-09 MED ORDER — LOSARTAN POTASSIUM 50 MG PO TABS: 50.0000 mg | ORAL_TABLET | Freq: Every day | ORAL | 1 refills | Status: DC

## 2022-07-09 MED ORDER — MONTELUKAST SODIUM 10 MG PO TABS: 10.0000 mg | ORAL_TABLET | Freq: Every day | ORAL | 1 refills | Status: DC

## 2022-07-09 MED ORDER — BASAGLAR KWIKPEN 100 UNIT/ML ~~LOC~~ SOPN: 8.0000 [IU] | PEN_INJECTOR | Freq: Every day | SUBCUTANEOUS | 1 refills | Status: DC

## 2022-07-09 MED ORDER — TRIAMCINOLONE ACETONIDE 0.1 % EX CREA: 1.0000 | TOPICAL_CREAM | Freq: Two times a day (BID) | CUTANEOUS | 0 refills | Status: DC

## 2022-07-09 NOTE — Assessment & Plan Note (Signed)
Chronic problem.  Excellent control.  Check labs due to ACE but no anticipated med changes.  Will follow.

## 2022-07-09 NOTE — Patient Instructions (Signed)
Follow up in 6 months to recheck cholesterol and blood pressure We'll notify you of your lab results and make any changes if needed Keep up the good work on healthy diet and regular exercise- you look great!!! We'll call you with your GI appt for the colonoscopy consultation Call with any questions or concerns Stay Safe!  Stay Healthy! Happy Bel[birth year] Birthday!!!

## 2022-07-09 NOTE — Progress Notes (Signed)
   Subjective:    Patient ID: Amber Washington, female    DOB: 03-10-1956, 66 y.o.   MRN: [id number]  HPI CPE- due for colonoscopy, Tdap, flu.  UTD on eye exam, foot exam, pap, mammo.  Due for microalbumin  Patient Care Team    Relationship Specialty Notifications Start End  Midge Minium, MD PCP - General Family Medicine  02/16/14   Richmond Campbell, MD Consulting Physician Gastroenterology  06/21/15   Key, Nelia Shi, NP Nurse Practitioner Gynecology  06/26/16   Clent Jacks, MD Consulting Physician Ophthalmology  06/26/16     Health Maintenance  Topic Date Due   COLONOSCOPY (Pts 45-36yr Insurance coverage will need to be confirmed)  06/26/2020   Diabetic kidney evaluation - Urine ACR  12/27/2020   TETANUS/TDAP  07/07/2022   Zoster Vaccines- Shingrix (1 of 2) 10/09/2022 (Originally 07/05/2008)   OPHTHALMOLOGY EXAM  09/17/2022   MAMMOGRAM  10/29/2022   HEMOGLOBIN A1C  12/17/2022   Diabetic kidney evaluation - GFR measurement  01/17/2023   FOOT EXAM  01/31/2023   PAP SMEAR-Modifier  10/25/2025   COVID-19 Vaccine  Completed   Hepatitis C Screening  Completed   HIV Screening  Completed   HPV VACCINES  Aged Out      Review of Systems Patient reports no vision/ hearing changes, adenopathy,fever, weight change,  persistant/recurrent hoarseness , swallowing issues, chest pain, palpitations, edema, persistant/recurrent cough, hemoptysis, dyspnea (rest/exertional/paroxysmal nocturnal), gastrointestinal bleeding (melena, rectal bleeding), abdominal pain, significant heartburn, bowel changes, GU symptoms (dysuria, hematuria, incontinence), Gyn symptoms (abnormal  bleeding, pain),  syncope, focal weakness, memory loss, numbness & tingling, skin/hair/nail changes, abnormal bruising or bleeding, anxiety, or depression.     Objective:   Physical Exam General Appearance:    Alert, cooperative, no distress, appears stated age  Head:    Normocephalic, without obvious abnormality, atraumatic   Eyes:    PERRL, conjunctiva/corneas clear, EOM's intact both eyes  Ears:    Normal TM's and external ear canals, both ears  Nose:   Nares normal, septum midline, mucosa normal, no drainage    or sinus tenderness  Throat:   Lips, mucosa, and tongue normal; teeth and gums normal  Neck:   Supple, symmetrical, trachea midline, no adenopathy;    Thyroid: no enlargement/tenderness/nodules  Back:     Symmetric, no curvature, ROM normal, no CVA tenderness  Lungs:     Clear to auscultation bilaterally, respirations unlabored  Chest Wall:    No tenderness or deformity   Heart:    Regular rate and rhythm, S1 and S2 normal, no murmur, rub   or gallop  Breast Exam:    Deferred to GYN  Abdomen:     Soft, non-tender, bowel sounds active all four quadrants,    no masses, no organomegaly  Genitalia:    Deferred to GYN  Rectal:    Extremities:   Extremities normal, atraumatic, no cyanosis or edema  Pulses:   2+ and symmetric all extremities  Skin:   Skin color, texture, turgor normal, no rashes or lesions  Lymph nodes:   Cervical, supraclavicular, and axillary nodes normal  Neurologic:   CNII-XII intact, normal strength, sensation and reflexes    throughout          Assessment & Plan:

## 2022-07-09 NOTE — Addendum Note (Signed)
Addended by: Midge Minium on: 07/09/2022 02:23 PM   Modules accepted: Orders

## 2022-07-09 NOTE — Assessment & Plan Note (Signed)
Pt's PE WNL.  UTD on pap, mammo.  Due for colonoscopy- referral placed.  Tdap and flu given.  Check labs.  Anticipatory guidance provided.

## 2022-07-10 LAB — CBC WITH DIFFERENTIAL/PLATELET
Basophils Absolute: 0.1 10*3/uL (ref 0.0–0.1)
Basophils Relative: 1.1 % (ref 0.0–3.0)
Eosinophils Absolute: 0.2 10*3/uL (ref 0.0–0.7)
Eosinophils Relative: 2.5 % (ref 0.0–5.0)
HCT: 36.5 % (ref 36.0–46.0)
Hemoglobin: 12.1 g/dL (ref 12.0–15.0)
Lymphocytes Relative: 33 % (ref 12.0–46.0)
Lymphs Abs: 2.4 10*3/uL (ref 0.7–4.0)
MCHC: 33.1 g/dL (ref 30.0–36.0)
MCV: 88.5 fl (ref 78.0–100.0)
Monocytes Absolute: 0.9 10*3/uL (ref 0.1–1.0)
Monocytes Relative: 11.7 % (ref 3.0–12.0)
Neutro Abs: 3.8 10*3/uL (ref 1.4–7.7)
Neutrophils Relative %: 51.7 % (ref 43.0–77.0)
Platelets: 326 10*3/uL (ref 150.0–400.0)
RBC: 4.13 Mil/uL (ref 3.87–5.11)
RDW: 13.8 % (ref 11.5–15.5)
WBC: 7.3 10*3/uL (ref 4.0–10.5)

## 2022-07-10 LAB — BASIC METABOLIC PANEL
BUN: 26 mg/dL — ABNORMAL HIGH (ref 6–23)
CO2: 31 mEq/L (ref 19–32)
Calcium: 9.6 mg/dL (ref 8.4–10.5)
Chloride: 102 mEq/L (ref 96–112)
Creatinine, Ser: 0.83 mg/dL (ref 0.40–1.20)
GFR: 74.71 mL/min (ref >60.00)
Glucose, Bld: 184 mg/dL — ABNORMAL HIGH (ref 70–99)
Potassium: 4.2 mEq/L (ref 3.5–5.1)
Sodium: 141 mEq/L (ref 135–145)

## 2022-07-10 LAB — HEPATIC FUNCTION PANEL
ALT: 11 U/L (ref 0–35)
AST: 17 U/L (ref 0–37)
Albumin: 4.4 g/dL (ref 3.5–5.2)
Alkaline Phosphatase: 73 U/L (ref 39–117)
Bilirubin, Direct: 0.1 mg/dL (ref 0.0–0.3)
Total Bilirubin: 0.5 mg/dL (ref 0.2–1.2)
Total Protein: 7.4 g/dL (ref 6.0–8.3)

## 2022-07-10 LAB — TSH: TSH: 0.93 u[IU]/mL (ref 0.35–5.50)

## 2022-07-10 LAB — LIPID PANEL
Cholesterol: 147 mg/dL (ref 0–200)
HDL: 59 mg/dL (ref >39.00)
LDL Cholesterol: 67 mg/dL (ref 0–99)
NonHDL: 88.26
Total CHOL/HDL Ratio: 2
Triglycerides: 105 mg/dL (ref 0.0–149.0)
VLDL: 21 mg/dL (ref 0.0–40.0)

## 2022-07-21 ENCOUNTER — Encounter: Payer: Self-pay | Admitting: Internal Medicine

## 2022-08-22 ENCOUNTER — Telehealth: Payer: Self-pay

## 2022-08-22 NOTE — Telephone Encounter (Signed)
Patient called in and was made an appt on Monday

## 2022-08-25 ENCOUNTER — Ambulatory Visit: Payer: BC Managed Care – PPO | Admitting: Family Medicine

## 2022-08-28 ENCOUNTER — Encounter (INDEPENDENT_AMBULATORY_CARE_PROVIDER_SITE_OTHER): Payer: BC Managed Care – PPO | Admitting: Family Medicine

## 2022-08-28 DIAGNOSIS — J069 Acute upper respiratory infection, unspecified: Secondary | ICD-10-CM

## 2022-08-28 MED ORDER — AMOXICILLIN 875 MG PO TABS: 875.0000 mg | ORAL_TABLET | Freq: Two times a day (BID) | ORAL | 0 refills | Status: AC

## 2022-08-28 NOTE — Telephone Encounter (Signed)
[facility] VISIT   Patient agreed to [facility] visit and is aware that copayment and coinsurance may apply. Patient was treated using telemedicine according to accepted telemedicine protocols.  Subjective:   Patient complains of cough and ear pain  Patient Active Problem List   Diagnosis Date Noted   Benign paroxysmal positional vertigo 05/14/2018   Bronchitis, asthmatic 11/22/2015   Insomnia 05/31/2014   GERD (gastroesophageal reflux disease) 11/02/2013   Physical exam, annual 02/05/2011   History of breast cancer 06/22/2008   BEN NEOPLASM PANCREAS EXCEPT ISLETS LANGERHANS 06/22/2008   ALLERGIC RHINITIS 06/22/2008   Type 2 diabetes mellitus with hyperglycemia, with long-term current use of insulin ([place]) 02/24/2008   HYPERCHOLESTEROLEMIA 02/24/2008   ANXIETY 02/24/2008   Essential hypertension 02/24/2008   Asthma 02/24/2008   Social History   Tobacco Use   Smoking status: Never   Smokeless tobacco: Never  Substance Use Topics   Alcohol use: Yes    Comment: social use    Current Outpatient Medications:    amoxicillin (AMOXIL) 875 MG tablet, Take 1 tablet (875 mg total) by mouth 2 (two) times daily for 10 days., Disp: 20 tablet, Rfl: 0   albuterol (VENTOLIN HFA) 108 (90 Base) MCG/ACT inhaler, Inhale 2 puffs into the lungs every 6 (six) hours as needed for wheezing or shortness of breath., Disp: 1 each, Rfl: 2   ALPRAZolam (XANAX) 0.5 MG tablet, TAKE ONE TABLET BY MOUTH THREE TIMES DAILY AS NEEDED FOR SLEEP OR FOR ANXIETY, Disp: 90 tablet, Rfl: 1   aspirin 81 MG tablet, Take 81 mg by mouth daily., Disp: , Rfl:    atorvastatin (LIPITOR) 10 MG tablet, TAKE 1 TABLET DAILY (MYLAN), Disp: 90 tablet, Rfl: 1   Blood Glucose Monitoring Suppl (ACCU-CHEK GUIDE) w/Device KIT, Use as directed, Disp: 1 kit, Rfl: 0   budesonide-formoterol (SYMBICORT) 80-4.5 MCG/ACT inhaler, Inhale 2 puffs into the lungs 2 (two) times daily., Disp: 1 each, Rfl: 3   Cholecalciferol (VITAMIN D) 2000 UNITS CAPS, Take  1 capsule by mouth daily., Disp: , Rfl:    Continuous Blood Gluc Sensor (DEXCOM G7 SENSOR) MISC, Use as instructed to check blood sugar. Change every 10 days, Disp: 9 each, Rfl: 3   insulin aspart (NOVOLOG FLEXPEN) 100 UNIT/ML FlexPen, Inject under skin 3-5 units before dinner and lunch, Disp: 15 mL, Rfl: 3   Insulin Glargine (BASAGLAR KWIKPEN) 100 UNIT/ML, Inject 8 Units into the skin daily., Disp: 15 mL, Rfl: 1   Insulin Pen Needle (BD PEN NEEDLE NANO U/F) 32G X 4 MM MISC, USE 2 TO 3 TIMES A DAY, Disp: 200 each, Rfl: 3   losartan (COZAAR) 50 MG tablet, Take 1 tablet (50 mg total) by mouth daily., Disp: 180 tablet, Rfl: 1   metFORMIN (GLUCOPHAGE-XR) 500 MG 24 hr tablet, TAKE 2 TABLETS DAILY WITH  SUPPER, Disp: 180 tablet, Rfl: 3   montelukast (SINGULAIR) 10 MG tablet, Take 1 tablet (10 mg total) by mouth at bedtime., Disp: 180 tablet, Rfl: 1   Semaglutide (RYBELSUS) 7 MG TABS, Take 7 mg by mouth daily., Disp: 30 tablet, Rfl: 5   triamcinolone cream (KENALOG) 0.1 %, Apply 1 Application topically 2 (two) times daily., Disp: 80 g, Rfl: 0  Allergies  Allergen Reactions   Influenza Vaccines Other (See Comments)    Unknown, allergic to egg whites   Bactrim [Sulfamethoxazole-Trimethoprim] Nausea And Vomiting and Other (See Comments)    Shaking chills   Eggs Or Egg-Derived Products     Assessment and Plan:  Diagnosis: upper respiratory illness. Please see myChart communication and orders below.   No orders of the defined types were placed in this encounter.  Meds ordered this encounter  Medications   amoxicillin (AMOXIL) 875 MG tablet    Sig: Take 1 tablet (875 mg total) by mouth 2 (two) times daily for 10 days.    Dispense:  20 tablet    Refill:  0    Annye Asa, MD 08/28/2022  A total of 7 minutes were spent by me to personally review the patient-generated inquiry, review patient records and data pertinent to assessment of the patient's problem, develop a management plan  including generation of prescriptions and/or orders, and on subsequent communication with the patient through secure the MyChart portal service.   There is no separately reported E/M service related to this service in the past 7 days nor does the patient have an upcoming soonest available appointment for this issue. This work was completed in less than 7 days.   The patient consented to this service today (see patient agreement prior to ongoing communication). Patient counseled regarding the need for in-person exam for certain conditions and was advised to call the office if any changing or worsening symptoms occur.   The codes to be used for the E/M service are: _0   99421 for 5-10 minutes of time spent on the inquiry. _1   [id number] for 11-20 minutes. _2   [id number] for 21+ minutes.

## 2022-09-23 LAB — HM DIABETES EYE EXAM

## 2022-09-25 ENCOUNTER — Encounter: Payer: Self-pay | Admitting: Family Medicine

## 2022-10-22 ENCOUNTER — Ambulatory Visit: Payer: BC Managed Care – PPO | Admitting: Internal Medicine

## 2022-10-22 NOTE — Progress Notes (Unsigned)
Patient ID: Amber Washington, female   DOB: Oct 30, 1955, 67 y.o.   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  Last visit 4 months ago.  Interim history: No increased urination, blurry vision, nausea, chest pain.  Reviewed HbA1c levels: Lab Results  Component Value Date   HGBA1C 6.2 (A) 06/17/2022   HGBA1C 6.3 (A) 01/30/2022   HGBA1C 6.8 (A) 10/01/2021   She is on: - Januvia 100 mg before breakfast - Metformin ER 500 >> 1000 mg twice a day with meals (diarrhea) >> .Marland KitchenMarland Kitchen 1000 mg with dinner >> 500 mg 2x a day (b/c loose stools) - Basaglar 5 units daily at bedtime >> ...8 >> 10 >> 8 units in a.m. - Novolog  6-8 >> 3-4 units before dinner and occasionally lunch >> 3-5 units before lunch and dinner + Sliding scale: - 130-180: + 1 unit  - 181-230: + 2 units  - 231-280: + 3 units  - >281: + 4 units  She was on Jardiance 25 mg before b'fast >> yeast inf's >> stopped.  Pt checks sugars >4 times a day with her CGM - now Dexcom G7:  Prev.:  Previously:   Lowest: 55 >> 58 x 1 >> 54 (after dinner) >> 50s. Highest:217 >> 280 >> 302 (with steroids) >> 200s.  Glucometer: [employer]  Pt's meals are: - Breakfast: oatmeal + yoghurt + crackers - Lunch: salads + meat/cheese/crackers + fruit - Dinner: chicken + veggies or salad - Snacks: nuts , crackers, carrots - 1-x a day She only drinks water. She is walking for exercise.  No CKD, last BUN/creatinine:  Lab Results  Component Value Date   BUN 26 (H) 07/09/2022   BUN 20 01/16/2022   CREATININE 0.83 07/09/2022   CREATININE 0.94 01/16/2022   Normal ACR: Lab Results  Component Value Date   MICRALBCREAT 6.2 12/28/2019   MICRALBCREAT 3.4 06/28/2012   MICRALBCREAT 2.3 01/27/2011   MICRALBCREAT 3.2 02/05/2010  On losartan.  + HL; last set of  lipids: Lab Results  Component Value Date   CHOL 147 07/09/2022   HDL 59.00 07/09/2022   LDLCALC 67 07/09/2022   TRIG 105.0 07/09/2022   CHOLHDL 2 07/09/2022  On Lipitor 10.  - last eye exam was on 09/2021: No DR reportedly  -she denies numbness and tingling in her feet.  Last foot exam 01/2022.  She has a history of HTN, hematuria, chronic insomnia-Xanax helping.  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  ROS: + see HPI  I reviewed pt's medications, allergies, PMH, social hx, family hx, and changes were documented in the history of present illness. Otherwise, unchanged from my initial visit note.  Past Medical History:  Diagnosis Date   Allergic rhinitis    Anxiety state, unspecified    Asthma    Benign neoplasm of pancreas, except islets of Langerhans    Borderline hypertension    Hypercholesteremia    Malignant neoplasm of breast (female), unspecified site    Osteoporosis, unspecified    Personal history of chemotherapy    Personal history of radiation therapy    Stenosis of nasolacrimal duct, acquired    Type II or unspecified type diabetes mellitus without mention of complication, not stated as uncontrolled    Past Surgical History:  Procedure Laterality Date   BREAST BIOPSY     BREAST EXCISIONAL BIOPSY     BREAST LUMPECTOMY     [id number] 1995   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   NASAL SEPTUM SURGERY     right breast cancer sugery  1995   surgery on neck for removal of birthmark     tur induction     Social History   Social History   Marital status: Married    Spouse name: N/A   Number of children: 2   Occupational History   Radiation protection practitioner   Social History Main Topics   Smoking status: Never Smoker   Smokeless tobacco: Never Used   Alcohol use No   Drug use: No   Current Outpatient Medications on File Prior to Visit  Medication Sig Dispense Refill    albuterol (VENTOLIN HFA) 108 (90 Base) MCG/ACT inhaler Inhale 2 puffs into the lungs every 6 (six) hours as needed for wheezing or shortness of breath. 1 each 2   ALPRAZolam (XANAX) 0.5 MG tablet TAKE ONE TABLET BY MOUTH THREE TIMES DAILY AS NEEDED FOR SLEEP OR FOR ANXIETY 90 tablet 1   aspirin 81 MG tablet Take 81 mg by mouth daily.     atorvastatin (LIPITOR) 10 MG tablet TAKE 1 TABLET DAILY (MYLAN) 90 tablet 1   Blood Glucose Monitoring Suppl (ACCU-CHEK GUIDE) w/Device KIT Use as directed 1 kit 0   budesonide-formoterol (SYMBICORT) 80-4.5 MCG/ACT inhaler Inhale 2 puffs into the lungs 2 (two) times daily. 1 each 3   Cholecalciferol (VITAMIN D) 2000 UNITS CAPS Take 1 capsule by mouth daily.     Continuous Blood Gluc Sensor (DEXCOM G7 SENSOR) MISC Use as instructed to check blood sugar. Change every 10 days 9 each 3   insulin aspart (NOVOLOG FLEXPEN) 100 UNIT/ML FlexPen Inject under skin 3-5 units before dinner and lunch 15 mL 3   Insulin Glargine (BASAGLAR KWIKPEN) 100 UNIT/ML Inject 8 Units into the skin daily. 15 mL 1   Insulin Pen Needle (BD PEN NEEDLE NANO U/F) 32G X 4 MM MISC USE 2 TO 3 TIMES A DAY 200 each 3   losartan (COZAAR) 50 MG tablet Take 1 tablet (50 mg total) by mouth daily. 180 tablet 1   metFORMIN (GLUCOPHAGE-XR) 500 MG 24 hr tablet TAKE 2 TABLETS DAILY WITH  SUPPER 180 tablet 3   montelukast (SINGULAIR) 10 MG tablet Take 1 tablet (10 mg total) by mouth at bedtime. 180 tablet 1   Semaglutide (RYBELSUS) 7 MG TABS Take 7 mg by mouth daily. 30 tablet 5   triamcinolone cream (KENALOG) 0.1 % Apply 1 Application topically 2 (two) times daily. 80 g 0   No current facility-administered medications on file prior to visit.   Allergies  Allergen Reactions   Influenza Vaccines Other (See Comments)    Unknown, allergic to egg whites   Bactrim [Sulfamethoxazole-Trimethoprim] Nausea And Vomiting and Other (See Comments)    Shaking chills   Eggs Or Egg-Derived Products    FH: - mother -  leukemia, father - liver cancer - mother - HTN - DM in nephew.  PE: There were no vitals taken for this visit.  Wt Readings from Last 3 Encounters:  07/09/22 146 lb (66.2 kg)  06/17/22 147 lb 12.8 oz (67 kg)  01/30/22 147 lb 12.8 oz (67 kg)   Constitutional: normal weight, in NAD Eyes: EOMI, no exophthalmos, + arcus cornealis ENT: no thyromegaly, no cervical lymphadenopathy Cardiovascular: RRR,  No MRG Respiratory: CTA B Musculoskeletal: no deformities Skin:  no rashes Neurological: no tremor with outstretched hands  ASSESSMENT: 1. DM, insulin-dependent, previously uncontrolled, without long term complications, but with hyperglycemia  Component     Latest Ref Rng & Units 12/04/2016  C-Peptide     0.80 - 3.85 ng/mL 0.94  Glucose, Fasting     65 - 99 mg/dL 160 (H)  C-peptide is not very high, but is still in the normal range.   She has a history of distal pancreatic resection (?%) For a benign cyst and she developed diabetes soon after the surgery.  2. HL  PLAN:  1. Patient with history of diabetes due to distal pancreatectomy for pancreatic cyst.  Sugars improved after adding low-dose basal insulin but then worsened so we had to add NovoLog, initially with dinner, then, at last visit, we also added this for lunch.  I also gave her a sliding scale at last visit.  She is also on metformin ER (decreased dose due to loose stools) and DPP 4 inhibitor.  She is doing a great job with her diet, eating healthy starches, and limiting her carbs otherwise. -At last visit, sugars are well controlled during the night but they were increasing after breakfast, usually under 180, and after lunch, frequently above 180.  After dinner, sugars are well controlled.  I advised her to add NovoLog before lunch and we discussed about adjusting the dose for starches.  I also advised her that fried foods can greatly increase her blood sugars.  I gave her a sliding scale but continued the rest of the regimen.   HbA1c was 6.3%, decreased, excellent. CGM interpretation: -At today's visit, we reviewed her CGM downloads: It appears that 87% of values are in target range (goal >70%), while 12% are higher than 180 (goal <25%), and 1% are lower than 70 (goal <4%).  The calculated average blood sugar is 129.  The projected HbA1c for the next 3 months (GMI) is 6.4%. -Reviewing the CGM trends, it appears that her sugars are excellently controlled overnight, but they do increase occasionally after breakfast, lunch, and dinner, above 180, with the vast majority of the blood sugars at goal, though.  She does feel that after some meals, her blood sugars are dropping as if the insulin is kicking in sooner than expected and for some meals, the sugars remain high even if she injects correctly, 15 minutes before the meals.  We discussed that this may be related to the particular meal, and the fat content along with the glycemic index of the foods ingested, but it could be related to other factors including activity and stress.  She did have more stress since last visit.  I advised her to do an experiment and when she eats the same meal over 2 or 3 days and use the same amount of insulin.  If the changes are consistent, we may need to change the insulin dose. -She does have occasional lower blood sugars in the 50s and 60s.  She had more before changing from [place] through the [facility] sensor.  She likes the fact that she cannot with the Dexcom sensor on the abdomen and is not falsely alarming her about low blood sugars when she laid on the arm with the sensor, as it happened with the freestyle libre CGM. -we discussed about the new study with GLP-1 receptor agonist and type 1 diabetes showing that he will for low doses of GLP-1 receptor agonist, blood sugars could  improve while on this and patients may even come off mealtime insulin.  She agrees to try Rybelsus instead of Januvia, if covered by insurance.  I sent a prescription to her  pharmacy.  - I suggested to:  Patient Instructions  Please continue: - Metformin ER 500 mg 2x a day - Januvia 100 mg before b'fast  - but try to change to Rybelsus 7 mg daily (start at 3.5 mg daily) - Basaglar 8 units in a.m. - NovoLog 3-5 units before dinner and lunch   Also, use the following sliding scale for NovoLog: - 130-180: + 1 unit  - 181-230: + 2 units  - 231-280: + 3 units  - >281: + 4 units   Please return in 4 months   - we checked her HbA1c: 6.2% (lower) - advised to check sugars at different times of the day - 4x a day, rotating check times - advised for yearly eye exams >> she is UTD - she has an appointment coming up with Dr. Birdie Riddle later this year - return to clinic in 4 months  2. HL -She has arcus cornealis -Reviewed her latest lipid panel from a year ago: All fractions at goal: Lab Results  Component Value Date   CHOL 147 07/09/2022   HDL 59.00 07/09/2022   LDLCALC 67 07/09/2022   TRIG 105.0 07/09/2022   CHOLHDL 2 07/09/2022  -She is on Lipitor 10 mg daily without side effects -She is due for another lipid panel -appointment coming up with PCP  Philemon Kingdom, MD PhD [facility] Endocrinology

## 2022-10-23 ENCOUNTER — Ambulatory Visit (INDEPENDENT_AMBULATORY_CARE_PROVIDER_SITE_OTHER): Payer: BC Managed Care – PPO | Admitting: Internal Medicine

## 2022-10-23 ENCOUNTER — Encounter: Payer: Self-pay | Admitting: Internal Medicine

## 2022-10-23 VITALS — BP 132/80 | HR 70 | Ht 69.0 in | Wt 150.2 lb

## 2022-10-23 DIAGNOSIS — E1365 Other specified diabetes mellitus with hyperglycemia: Secondary | ICD-10-CM

## 2022-10-23 DIAGNOSIS — E7800 Pure hypercholesterolemia, unspecified: Secondary | ICD-10-CM | POA: Diagnosis not present

## 2022-10-23 DIAGNOSIS — Z794 Long term (current) use of insulin: Secondary | ICD-10-CM | POA: Diagnosis not present

## 2022-10-23 DIAGNOSIS — E0865 Diabetes mellitus due to underlying condition with hyperglycemia: Secondary | ICD-10-CM

## 2022-10-23 LAB — POCT GLYCOSYLATED HEMOGLOBIN (HGB A1C): Hemoglobin A1C: 6.2 % — AB (ref 4.0–5.6)

## 2022-10-23 NOTE — Patient Instructions (Addendum)
Please continue: - Metformin ER 500 mg 2x a day - Basaglar 8 units in a.m. - NovoLog 3-5 units before dinner and lunch   Also, use the following sliding scale for NovoLog: - 130-180: + 1 unit  - 181-230: + 2 units  - 231-280: + 3 units  - >281: + 4 units    Please try to move Rybelsus 3.5 mg before dinner. If the sugars after dinner are still high, let's try to move the NovoLog 30 min before the meals.  Please review the 15-15 rule.  Please return in 4 months

## 2022-10-27 ENCOUNTER — Encounter: Payer: Self-pay | Admitting: Family Medicine

## 2022-10-27 ENCOUNTER — Other Ambulatory Visit: Payer: Self-pay

## 2022-10-27 MED ORDER — ALPRAZOLAM 0.5 MG PO TABS: ORAL_TABLET | ORAL | 1 refills | Status: DC

## 2022-10-27 NOTE — Telephone Encounter (Signed)
Xanax 0.5 mg LOV: 07/09/22 Last Refill:05/29/22 Upcoming appt: 01/08/23

## 2022-10-27 NOTE — Telephone Encounter (Signed)
Informed pt that the Rx has been sent in

## 2022-10-28 ENCOUNTER — Other Ambulatory Visit: Payer: Self-pay | Admitting: Internal Medicine

## 2022-11-08 ENCOUNTER — Other Ambulatory Visit: Payer: Self-pay | Admitting: Family Medicine

## 2023-01-08 ENCOUNTER — Ambulatory Visit: Payer: BC Managed Care – PPO | Admitting: Family Medicine

## 2023-01-08 ENCOUNTER — Encounter: Payer: Self-pay | Admitting: Family Medicine

## 2023-01-08 VITALS — BP 122/62 | HR 75 | Temp 97.8°F | Ht 69.0 in | Wt 148.6 lb

## 2023-01-08 DIAGNOSIS — Z23 Encounter for immunization: Secondary | ICD-10-CM

## 2023-01-08 DIAGNOSIS — Z1211 Encounter for screening for malignant neoplasm of colon: Secondary | ICD-10-CM

## 2023-01-08 DIAGNOSIS — I10 Essential (primary) hypertension: Secondary | ICD-10-CM

## 2023-01-08 DIAGNOSIS — Z794 Long term (current) use of insulin: Secondary | ICD-10-CM | POA: Diagnosis not present

## 2023-01-08 DIAGNOSIS — E1165 Type 2 diabetes mellitus with hyperglycemia: Secondary | ICD-10-CM | POA: Diagnosis not present

## 2023-01-08 DIAGNOSIS — E7800 Pure hypercholesterolemia, unspecified: Secondary | ICD-10-CM

## 2023-01-08 LAB — BASIC METABOLIC PANEL
BUN: 22 mg/dL (ref 6–23)
CO2: 31 mEq/L (ref 19–32)
Calcium: 9.6 mg/dL (ref 8.4–10.5)
Chloride: 102 mEq/L (ref 96–112)
Creatinine, Ser: 0.86 mg/dL (ref 0.40–1.20)
GFR: 71.35 mL/min (ref >60.00)
Glucose, Bld: 143 mg/dL — ABNORMAL HIGH (ref 70–99)
Potassium: 4.8 mEq/L (ref 3.5–5.1)
Sodium: 140 mEq/L (ref 135–145)

## 2023-01-08 LAB — MICROALBUMIN / CREATININE URINE RATIO
Creatinine,U: 204.7 mg/dL
Microalb Creat Ratio: 5.2 mg/g (ref 0.0–30.0)
Microalb, Ur: 10.6 mg/dL — ABNORMAL HIGH (ref 0.0–1.9)

## 2023-01-08 LAB — CBC WITH DIFFERENTIAL/PLATELET
Basophils Absolute: 0.1 10*3/uL (ref 0.0–0.1)
Basophils Relative: 0.9 % (ref 0.0–3.0)
Eosinophils Absolute: 0.3 10*3/uL (ref 0.0–0.7)
Eosinophils Relative: 4.8 % (ref 0.0–5.0)
HCT: 37.9 % (ref 36.0–46.0)
Hemoglobin: 12.6 g/dL (ref 12.0–15.0)
Lymphocytes Relative: 29.9 % (ref 12.0–46.0)
Lymphs Abs: 2.1 10*3/uL (ref 0.7–4.0)
MCHC: 33.2 g/dL (ref 30.0–36.0)
MCV: 89.4 fl (ref 78.0–100.0)
Monocytes Absolute: 0.8 10*3/uL (ref 0.1–1.0)
Monocytes Relative: 12 % (ref 3.0–12.0)
Neutro Abs: 3.6 10*3/uL (ref 1.4–7.7)
Neutrophils Relative %: 52.4 % (ref 43.0–77.0)
Platelets: 333 10*3/uL (ref 150.0–400.0)
RBC: 4.24 Mil/uL (ref 3.87–5.11)
RDW: 13.5 % (ref 11.5–15.5)
WBC: 6.9 10*3/uL (ref 4.0–10.5)

## 2023-01-08 LAB — HEPATIC FUNCTION PANEL
ALT: 14 U/L (ref 0–35)
AST: 19 U/L (ref 0–37)
Albumin: 4.1 g/dL (ref 3.5–5.2)
Alkaline Phosphatase: 68 U/L (ref 39–117)
Bilirubin, Direct: 0.1 mg/dL (ref 0.0–0.3)
Total Bilirubin: 0.5 mg/dL (ref 0.2–1.2)
Total Protein: 7.1 g/dL (ref 6.0–8.3)

## 2023-01-08 LAB — LIPID PANEL
Cholesterol: 138 mg/dL (ref 0–200)
HDL: 59.3 mg/dL (ref >39.00)
LDL Cholesterol: 60 mg/dL (ref 0–99)
NonHDL: 78.37
Total CHOL/HDL Ratio: 2
Triglycerides: 92 mg/dL (ref 0.0–149.0)
VLDL: 18.4 mg/dL (ref 0.0–40.0)

## 2023-01-08 LAB — TSH: TSH: 1.18 u[IU]/mL (ref 0.35–5.50)

## 2023-01-08 NOTE — Assessment & Plan Note (Signed)
Chronic problem.  On Lipitor 10mg daily w/o difficulty.  Check labs.  Adjust meds prn  

## 2023-01-08 NOTE — Patient Instructions (Signed)
Schedule your complete physical in 6 months We'll notify you of your lab results and make any changes if needed Keep up the good work!  You look great! We'll call you with your GI referral regarding colonoscopy Call with any questions or concerns Stay Safe!  Stay Healthy! Have a great summer!!

## 2023-01-08 NOTE — Progress Notes (Signed)
   Subjective:    Patient ID: Amber Washington, female    DOB: 06-21-1956, 67 y.o.   MRN: [id number]  HPI HTN- chronic problem, on Losartan 50mg  daily w/ good control.  Denies CP, SOB, HA's, visual changes, edema.  Hyperlipidemia- chronic problem, on Lipitor 10mg  daily.  No abd pain, N/V.  DM- chronic problem, following w/ Endo.  Due for microalbumin.  Last A1C 6.2%  Mammo- UTD w/ [facility]  Colonoscopy- due.  Had issues getting [facility] to schedule.   Review of Systems For ROS see HPI     Objective:   Physical Exam Vitals reviewed.  Constitutional:      General: She is not in acute distress.    Appearance: Normal appearance. She is well-developed. She is not ill-appearing.  HENT:     Head: Normocephalic and atraumatic.  Eyes:     Conjunctiva/sclera: Conjunctivae normal.     Pupils: Pupils are equal, round, and reactive to light.  Neck:     Thyroid: No thyromegaly.  Cardiovascular:     Rate and Rhythm: Normal rate and regular rhythm.     Heart sounds: Normal heart sounds. No murmur heard. Pulmonary:     Effort: Pulmonary effort is normal. No respiratory distress.     Breath sounds: Normal breath sounds.  Abdominal:     General: There is no distension.     Palpations: Abdomen is soft.     Tenderness: There is no abdominal tenderness.  Musculoskeletal:     Cervical back: Normal range of motion and neck supple.  Lymphadenopathy:     Cervical: No cervical adenopathy.  Skin:    General: Skin is warm and dry.  Neurological:     Mental Status: She is alert and oriented to person, place, and time.  Psychiatric:        Behavior: Behavior normal.           Assessment & Plan:

## 2023-01-08 NOTE — Assessment & Plan Note (Signed)
Chronic problem.  On Losartan 50mg  daily w/ good control.  Currently asymptomatic.  Check labs due to ARB use but no anticipated med changes.

## 2023-01-08 NOTE — Assessment & Plan Note (Signed)
Chronic problem.  Following w/ Endo- last A1C 6.2%  UTD on foot exam, eye exam.  Microalbumin ordered.

## 2023-01-09 ENCOUNTER — Telehealth: Payer: Self-pay

## 2023-01-09 NOTE — Telephone Encounter (Signed)
Pt aware of results 

## 2023-01-09 NOTE — Telephone Encounter (Signed)
-----   Message from Sheliah Hatch, MD sent at 01/09/2023  7:42 AM EDT ----- Labs look great!  No changes at this time

## 2023-01-15 DIAGNOSIS — M542 Cervicalgia: Secondary | ICD-10-CM | POA: Insufficient documentation

## 2023-01-15 DIAGNOSIS — H6993 Unspecified Eustachian tube disorder, bilateral: Secondary | ICD-10-CM | POA: Insufficient documentation

## 2023-01-15 DIAGNOSIS — H93299 Other abnormal auditory perceptions, unspecified ear: Secondary | ICD-10-CM | POA: Insufficient documentation

## 2023-03-24 ENCOUNTER — Encounter: Payer: Self-pay | Admitting: Internal Medicine

## 2023-03-24 ENCOUNTER — Ambulatory Visit (INDEPENDENT_AMBULATORY_CARE_PROVIDER_SITE_OTHER): Payer: BC Managed Care – PPO | Admitting: Internal Medicine

## 2023-03-24 VITALS — BP 120/80 | HR 79 | Ht 69.0 in | Wt 150.0 lb

## 2023-03-24 DIAGNOSIS — E7800 Pure hypercholesterolemia, unspecified: Secondary | ICD-10-CM

## 2023-03-24 DIAGNOSIS — Z794 Long term (current) use of insulin: Secondary | ICD-10-CM | POA: Diagnosis not present

## 2023-03-24 DIAGNOSIS — Z90411 Acquired partial absence of pancreas: Secondary | ICD-10-CM | POA: Diagnosis not present

## 2023-03-24 DIAGNOSIS — Z7984 Long term (current) use of oral hypoglycemic drugs: Secondary | ICD-10-CM

## 2023-03-24 DIAGNOSIS — E1365 Other specified diabetes mellitus with hyperglycemia: Secondary | ICD-10-CM | POA: Diagnosis not present

## 2023-03-24 DIAGNOSIS — E0865 Diabetes mellitus due to underlying condition with hyperglycemia: Secondary | ICD-10-CM

## 2023-03-24 DIAGNOSIS — E119 Type 2 diabetes mellitus without complications: Secondary | ICD-10-CM

## 2023-03-24 DIAGNOSIS — E1165 Type 2 diabetes mellitus with hyperglycemia: Secondary | ICD-10-CM

## 2023-03-24 LAB — HEMOGLOBIN A1C: Hemoglobin A1C: 6.4

## 2023-03-24 MED ORDER — METFORMIN HCL ER 500 MG PO TB24
500.0000 mg | ORAL_TABLET | Freq: Two times a day (BID) | ORAL | Status: AC
Start: 2023-03-24 — End: ?

## 2023-03-24 MED ORDER — DEXCOM G7 SENSOR MISC
3 refills | Status: DC
Start: 2023-03-24 — End: 2023-10-12

## 2023-03-24 MED ORDER — RYBELSUS 7 MG PO TABS: 7.0000 mg | ORAL_TABLET | Freq: Every day | ORAL | Status: DC

## 2023-03-24 MED ORDER — LANTUS SOLOSTAR 100 UNIT/ML ~~LOC~~ SOPN: 8.0000 [IU] | PEN_INJECTOR | Freq: Every day | SUBCUTANEOUS | 3 refills | Status: DC

## 2023-03-24 MED ORDER — NOVOLOG FLEXPEN 100 UNIT/ML ~~LOC~~ SOPN: PEN_INJECTOR | SUBCUTANEOUS | 3 refills | Status: DC

## 2023-03-24 NOTE — Patient Instructions (Addendum)
Please continue: - Metformin ER 500 mg 2x a day - Lantus 8 units in a.m.  Please increase: - Rybelsus 7 mg before b'fast - NovoLog 3-5 units 30 min before meals Also, use the following sliding scale for NovoLog: - 130-180: + 1 unit  - 181-230: + 2 units  - 231-280: + 3 units  - >281: + 4 units    Please return in 4 months

## 2023-03-24 NOTE — Progress Notes (Signed)
Patient ID: JAE BRUCK, female   DOB: 15-Jan-1956, 67 y.o.   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  Last visit 5 months ago.  Interim history: No increased urination, blurry vision, nausea, chest pain.  Reviewed HbA1c levels: Lab Results  Component Value Date   HGBA1C 6.2 (A) 10/23/2022   HGBA1C 6.2 (A) 06/17/2022   HGBA1C 6.3 (A) 01/30/2022   She is on: - Januvia 100 mg before breakfast >> Rybelsus 3.5 mg before b'fast (she could not tolerate the 7 mg dose) >> moved before Dinner - Metformin ER 500 >> 1000 mg twice a day with meals (diarrhea) >> .Marland KitchenMarland Kitchen 1000 mg with dinner >> 500 mg 2x a day (b/c loose stools) - Basaglar 5 units daily at bedtime >> ...8 >> 10 >> Lantus 8 units in a.m. - Novolog  6-8 >> 3-4 units before dinner and occasionally lunch >> 2-3-5 >> 2-4 units before lunch and dinner + Sliding scale: - 130-180: + 1 unit  - 181-230: + 2 units  - 231-280: + 3 units  - >281: + 4 units  She was on Jardiance 25 mg before b'fast >> yeast inf's >> stopped.  Pt checks sugars >4 times a day with her CGM - now Dexcom G7:   Previously:  Prev.:   Lowest: 50s >> 60s >> 60. Highest: 302 (with steroids) >> 200s >> 300 >> high 200s  Glucometer: Bayer Contour  Pt's meals are: - Breakfast: oatmeal + yoghurt + crackers - Lunch: salads + meat/cheese/crackers + fruit - Dinner: chicken + veggies or salad - Snacks: nuts , crackers, carrots - 1-x a day She only drinks water. She is walking for exercise.  No CKD, last BUN/creatinine:  Lab Results  Component Value Date   BUN 22 01/08/2023   BUN 26 (H) 07/09/2022   CREATININE 0.86 01/08/2023   CREATININE 0.83 07/09/2022   Normal ACR: Lab Results  Component Value Date   MICRALBCREAT 5.2 01/08/2023   MICRALBCREAT 6.2 12/28/2019    MICRALBCREAT 3.4 06/28/2012   MICRALBCREAT 2.3 01/27/2011   MICRALBCREAT 3.2 02/05/2010  On losartan.  + HL; last set of lipids: Lab Results  Component Value Date   CHOL 138 01/08/2023   HDL 59.30 01/08/2023   LDLCALC 60 01/08/2023   TRIG 92.0 01/08/2023   CHOLHDL 2 01/08/2023  On Lipitor 10.  - last eye exam was: 09/23/2022: No DR.  -she denies numbness and tingling in her feet.  Last foot exam 01/2022.  She has a history of HTN, hematuria, chronic insomnia-Xanax helping.  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  ROS: + see HPI  I reviewed pt's medications, allergies, PMH, social hx, family hx, and changes were documented in the history of present illness. Otherwise, unchanged from my initial visit note.  Past Medical History:  Diagnosis Date   Allergic rhinitis    Anxiety state, unspecified    Asthma    Benign neoplasm of pancreas, except islets of Langerhans    Borderline hypertension    Hypercholesteremia    Malignant neoplasm of breast (female), unspecified site    Osteoporosis, unspecified    Personal history of chemotherapy    Personal history of radiation therapy    Stenosis of nasolacrimal duct, acquired    Type  II or unspecified type diabetes mellitus without mention of complication, not stated as uncontrolled    Past Surgical History:  Procedure Laterality Date   BREAST BIOPSY     BREAST EXCISIONAL BIOPSY     BREAST LUMPECTOMY     [id number] 1995   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   NASAL SEPTUM SURGERY     right breast cancer sugery  1995   surgery on neck for removal of birthmark     tur induction     Social History   Social History   Marital status: Married    Spouse name: N/A   Number of children: 2   Occupational History   Catering manager   Social History Main Topics   Smoking status: Never Smoker   Smokeless tobacco: Never Used   Alcohol  use No   Drug use: No   Current Outpatient Medications on File Prior to Visit  Medication Sig Dispense Refill   albuterol (VENTOLIN HFA) 108 (90 Base) MCG/ACT inhaler Inhale 2 puffs into the lungs every 6 (six) hours as needed for wheezing or shortness of breath. 1 each 2   ALPRAZolam (XANAX) 0.5 MG tablet TAKE ONE TABLET BY MOUTH THREE TIMES DAILY AS NEEDED FOR SLEEP OR FOR ANXIETY 90 tablet 1   aspirin 81 MG tablet Take 81 mg by mouth daily.     atorvastatin (LIPITOR) 10 MG tablet TAKE 1 TABLET DAILY (MYLAN) 90 tablet 1   BD PEN NEEDLE NANO U/F 32G X 4 MM MISC USE 2 TO 3 TIMES A DAY 200 each 3   Blood Glucose Monitoring Suppl (ACCU-CHEK GUIDE) w/Device KIT Use as directed 1 kit 0   budesonide-formoterol (SYMBICORT) 80-4.5 MCG/ACT inhaler Inhale 2 puffs into the lungs 2 (two) times daily. 1 each 3   Cholecalciferol (VITAMIN D) 2000 UNITS CAPS Take 1 capsule by mouth daily.     Continuous Blood Gluc Sensor (DEXCOM G7 SENSOR) MISC Use as instructed to check blood sugar. Change every 10 days 9 each 3   insulin aspart (NOVOLOG FLEXPEN) 100 UNIT/ML FlexPen Inject under skin 3-5 units before dinner and lunch 15 mL 3   insulin glargine (LANTUS) 100 UNIT/ML injection Inject 8 Units into the skin daily.     losartan (COZAAR) 50 MG tablet Take 1 tablet (50 mg total) by mouth daily. 180 tablet 1   metFORMIN (GLUCOPHAGE-XR) 500 MG 24 hr tablet TAKE 2 TABLETS DAILY WITH  SUPPER 180 tablet 3   montelukast (SINGULAIR) 10 MG tablet Take 1 tablet (10 mg total) by mouth at bedtime. 180 tablet 1   Semaglutide (RYBELSUS) 7 MG TABS Take 7 mg by mouth daily. (Patient taking differently: Take 3.5 mg by mouth daily.) 30 tablet 5   triamcinolone cream (KENALOG) 0.1 % Apply 1 Application topically 2 (two) times daily. 80 g 0   No current facility-administered medications on file prior to visit.   Allergies  Allergen Reactions   Influenza Vaccines Other (See Comments)    Unknown, allergic to egg whites   Bactrim  [Sulfamethoxazole-Trimethoprim] Nausea And Vomiting and Other (See Comments)    Shaking chills   Egg-Derived Products    FH: - mother - leukemia, father - liver cancer - mother - HTN - DM in nephew.  PE: BP 120/80   Pulse 79   Ht 5\' 9"  (1.753 m)   Wt 150 lb (68 kg)   SpO2 95%   BMI 22.15 kg/m   Wt Readings from Last  3 Encounters:  03/24/23 150 lb (68 kg)  01/08/23 148 lb 9.6 oz (67.4 kg)  10/23/22 150 lb 3.2 oz (68.1 kg)   Constitutional: normal weight, in NAD Eyes: EOMI, no exophthalmos, + arcus cornealis ENT: no thyromegaly, no cervical lymphadenopathy Cardiovascular: RRR, No MRG Respiratory: CTA B Musculoskeletal: no deformities Skin:  no rashes Neurological: no tremor with outstretched hands Diabetic Foot Exam - Simple   Simple Foot Form Diabetic Foot exam was performed with the following findings: Yes 03/24/2023  8:35 AM  Visual Inspection No deformities, no ulcerations, no other skin breakdown bilaterally: Yes Sensation Testing Intact to touch and monofilament testing bilaterally: Yes Pulse Check Posterior Tibialis and Dorsalis pulse intact bilaterally: Yes Comments    ASSESSMENT: 1. DM, insulin-dependent, previously uncontrolled, without long term complications, but with hyperglycemia  Component     Latest Ref Rng & Units 12/04/2016  C-Peptide     0.80 - 3.85 ng/mL 0.94  Glucose, Fasting     65 - 99 mg/dL 161 (H)  C-peptide is not very high, but is still in the normal range.   She has a history of distal pancreatic resection (?%) For a benign cyst and she developed diabetes soon after the surgery.  2. HL  PLAN:  1. Patient with history of diabetes due to distal pancreatectomy for pancreatic cyst.  Sugars improved after adding low-dose basal insulin but then worsened so we had to add mealtime insulin, initially with dinner, and then for lunch, also.  She is also on metformin extended release and previously DPP 4 inhibitor but then switched to low-dose  GLP-1 receptor agonist.  HbA1c at last visit was stable, at goal, at 6.2%.  At that time, sugars were still fluctuating mostly within the target range but occasionally higher after breakfast and lunch and more consistently elevated after dinner.  We discussed about different treatment options to improve her post dinner blood sugars.  I advised her to try to move Rybelsus before dinner, but if this was not working well, to take NovoLog earlier before dinner.  We discussed that she may also need to move NovoLog 30 minutes before lunch, also.  We continued the rest of the regimen.  She occasionally had low blood sugars and we discussed about trying not to overcorrect these.  We emphasized the 15-15 hypoglycemia correction rule. CGM interpretation: -At today's visit, we reviewed her CGM downloads: It appears that 83% of values are in target range (goal >70%), while 17% are higher than 180 (goal <25%), and 0% are lower than 70 (goal <4%).  The calculated average blood sugar is 140.  The projected HbA1c for the next 3 months (GMI) is 6.6%. -Reviewing the CGM trends, sugars are mostly fluctuating within target range with higher blood sugars occasionally after breakfast and more consistently after dinner.  She did move Rybelsus before dinner since last visit, sugars appear to be improved except for the last 2 weeks, when she was out of town.  Due to the hyperglycemic spikes, we discussed about the possibility of trying to increase the Rybelsus again, if tolerated.  If not, I advised her to try to move the NovoLog injections approximately 30 minutes before the meals.  If this is also not effective in alleviating the postprandial hyperglycemia, we may need slightly higher doses of NovoLog.  For now, we discussed about using 5 units only before larger meals. - I suggested to:  Patient Instructions  Please continue: - Metformin ER 500 mg 2x a day - Lantus  8 units in a.m.  Please increase: - Rybelsus 7 mg before  b'fast - NovoLog 3-5 units 30 min before meals Also, use the following sliding scale for NovoLog: - 130-180: + 1 unit  - 181-230: + 2 units  - 231-280: + 3 units  - >281: + 4 units    Please return in 4 months   - we checked her HbA1c: 6.4% (slightly higher) - advised to check sugars at different times of the day - 4x a day, rotating check times - advised for yearly eye exams >> she is UTD - return to clinic in 4 months  2. HL -She has arcus cornealis -Reviewed latest lipid panel from 01/2023: All fractions at goal: Lab Results  Component Value Date   CHOL 138 01/08/2023   HDL 59.30 01/08/2023   LDLCALC 60 01/08/2023   TRIG 92.0 01/08/2023   CHOLHDL 2 01/08/2023  -She continues Lipitor 10 mg daily without side effects  Carlus Pavlov, MD PhD [facility]

## 2023-04-09 ENCOUNTER — Other Ambulatory Visit: Payer: Self-pay

## 2023-04-09 ENCOUNTER — Encounter: Payer: Self-pay | Admitting: Family Medicine

## 2023-04-09 MED ORDER — ALPRAZOLAM 0.5 MG PO TABS: ORAL_TABLET | ORAL | 1 refills | Status: DC

## 2023-04-09 NOTE — Telephone Encounter (Signed)
Xanax 0.5 mg LOV: 01/08/23 Last Refill:10/27/22 Upcoming appt: 07/21/23  [facility]

## 2023-04-13 ENCOUNTER — Other Ambulatory Visit: Payer: Self-pay | Admitting: Family Medicine

## 2023-04-17 ENCOUNTER — Other Ambulatory Visit: Payer: Self-pay | Admitting: Internal Medicine

## 2023-04-24 ENCOUNTER — Telehealth: Payer: BC Managed Care – PPO | Admitting: Family Medicine

## 2023-04-24 ENCOUNTER — Telehealth: Payer: Self-pay | Admitting: Family Medicine

## 2023-04-24 NOTE — Telephone Encounter (Signed)
Pt started with burning yesterday, pt has pressure causing frequency and urgency, pink tinge to her urine. Notes she is in [place]

## 2023-04-24 NOTE — Telephone Encounter (Signed)
Pt has been advised to seek care at urgent care

## 2023-04-24 NOTE — Telephone Encounter (Signed)
Caller name: DEVON LOVEN  On DPR?: Yes  Call back number: [phone] (mobile)  Provider they see: Sheliah Hatch, MD  Reason for call:  Pt states she is out of town and thinks she has a yeast infection. She has pain and bleeding and some burning when she goes to the bathroom. She is also experiencing pressure that makes her feel like she needs to go more frequently. She wants to know what she can do or if something can be called in where she is.

## 2023-05-14 ENCOUNTER — Encounter: Payer: Self-pay | Admitting: Internal Medicine

## 2023-05-14 NOTE — Telephone Encounter (Signed)
Please Advise

## 2023-05-18 LAB — HM COLONOSCOPY

## 2023-05-19 DIAGNOSIS — Z860101 Personal history of adenomatous and serrated colon polyps: Secondary | ICD-10-CM | POA: Insufficient documentation

## 2023-05-19 DIAGNOSIS — K5790 Diverticulosis of intestine, part unspecified, without perforation or abscess without bleeding: Secondary | ICD-10-CM | POA: Insufficient documentation

## 2023-05-26 ENCOUNTER — Encounter: Payer: Self-pay | Admitting: Internal Medicine

## 2023-05-26 ENCOUNTER — Telehealth: Payer: Self-pay

## 2023-05-26 NOTE — Telephone Encounter (Signed)
Pt needs PA for Rybelsus.

## 2023-05-27 ENCOUNTER — Other Ambulatory Visit (HOSPITAL_COMMUNITY): Payer: Self-pay

## 2023-05-27 ENCOUNTER — Telehealth: Payer: Self-pay

## 2023-05-27 NOTE — Telephone Encounter (Signed)
Pharmacy Patient Advocate Encounter   Received notification from Pt Calls Messages that prior authorization for Rybelsus is required/requested   Per test claim: PA required; PA submitted to [facility] via CoverMyMeds Key/confirmation #/EOC [id number] Status is pending

## 2023-05-28 ENCOUNTER — Other Ambulatory Visit (HOSPITAL_COMMUNITY): Payer: Self-pay

## 2023-06-03 NOTE — Telephone Encounter (Signed)
Pharmacy Patient Advocate Encounter  Received notification from [facility] that Prior Authorization for Rybelsus has been APPROVED through 05/26/2026   PA #/Case ID/Reference #:  [id number]

## 2023-06-05 ENCOUNTER — Other Ambulatory Visit: Payer: Self-pay | Admitting: Internal Medicine

## 2023-06-05 DIAGNOSIS — Z794 Long term (current) use of insulin: Secondary | ICD-10-CM

## 2023-06-23 ENCOUNTER — Encounter: Payer: Self-pay | Admitting: Internal Medicine

## 2023-06-29 ENCOUNTER — Telehealth: Payer: Self-pay

## 2023-06-29 ENCOUNTER — Other Ambulatory Visit (HOSPITAL_COMMUNITY): Payer: Self-pay

## 2023-06-29 NOTE — Telephone Encounter (Signed)
Pharmacy Patient Advocate Encounter   Received notification from Pt Calls Messages that prior authorization for Dexcom g7 sensor is required/requested.   Insurance verification completed.   The patient is insured through [place] .   Per test claim: PA required; PA submitted to [facility] via CoverMyMeds Key/confirmation #/EOC [id number] Status is pending   Per PA form:    Looks like a PA has already been started. Will follow up with determination

## 2023-06-29 NOTE — Telephone Encounter (Signed)
Patient needs a PA for Dexcom

## 2023-07-09 ENCOUNTER — Other Ambulatory Visit: Payer: Self-pay

## 2023-07-09 ENCOUNTER — Encounter: Payer: Self-pay | Admitting: Family Medicine

## 2023-07-09 DIAGNOSIS — J309 Allergic rhinitis, unspecified: Secondary | ICD-10-CM

## 2023-07-09 DIAGNOSIS — I10 Essential (primary) hypertension: Secondary | ICD-10-CM

## 2023-07-09 MED ORDER — LOSARTAN POTASSIUM 50 MG PO TABS: 50.0000 mg | ORAL_TABLET | Freq: Every day | ORAL | 1 refills | Status: DC

## 2023-07-09 MED ORDER — MONTELUKAST SODIUM 10 MG PO TABS
10.0000 mg | ORAL_TABLET | Freq: Every day | ORAL | 1 refills | Status: DC
Start: 2023-07-09 — End: 2024-07-07

## 2023-07-13 DIAGNOSIS — E1165 Type 2 diabetes mellitus with hyperglycemia: Secondary | ICD-10-CM | POA: Diagnosis not present

## 2023-07-21 ENCOUNTER — Encounter: Payer: Self-pay | Admitting: Family Medicine

## 2023-07-21 ENCOUNTER — Ambulatory Visit: Payer: Medicare PPO | Admitting: Family Medicine

## 2023-07-21 VITALS — BP 122/72 | HR 80 | Temp 98.0°F | Ht 69.0 in | Wt 151.2 lb

## 2023-07-21 DIAGNOSIS — Z794 Long term (current) use of insulin: Secondary | ICD-10-CM

## 2023-07-21 DIAGNOSIS — E1165 Type 2 diabetes mellitus with hyperglycemia: Secondary | ICD-10-CM

## 2023-07-21 DIAGNOSIS — Z0000 Encounter for general adult medical examination without abnormal findings: Secondary | ICD-10-CM

## 2023-07-21 DIAGNOSIS — Z23 Encounter for immunization: Secondary | ICD-10-CM

## 2023-07-21 DIAGNOSIS — M8580 Other specified disorders of bone density and structure, unspecified site: Secondary | ICD-10-CM | POA: Diagnosis not present

## 2023-07-21 DIAGNOSIS — G4700 Insomnia, unspecified: Secondary | ICD-10-CM

## 2023-07-21 DIAGNOSIS — Z0001 Encounter for general adult medical examination with abnormal findings: Secondary | ICD-10-CM | POA: Diagnosis not present

## 2023-07-21 LAB — CBC WITH DIFFERENTIAL/PLATELET
Basophils Absolute: 0.1 10*3/uL (ref 0.0–0.1)
Basophils Relative: 1.2 % (ref 0.0–3.0)
Eosinophils Absolute: 0.4 10*3/uL (ref 0.0–0.7)
Eosinophils Relative: 5.1 % — ABNORMAL HIGH (ref 0.0–5.0)
HCT: 39.1 % (ref 36.0–46.0)
Hemoglobin: 13.1 g/dL (ref 12.0–15.0)
Lymphocytes Relative: 34.3 % (ref 12.0–46.0)
Lymphs Abs: 2.4 10*3/uL (ref 0.7–4.0)
MCHC: 33.4 g/dL (ref 30.0–36.0)
MCV: 88.9 fL (ref 78.0–100.0)
Monocytes Absolute: 0.8 10*3/uL (ref 0.1–1.0)
Monocytes Relative: 12 % (ref 3.0–12.0)
Neutro Abs: 3.3 10*3/uL (ref 1.4–7.7)
Neutrophils Relative %: 47.4 % (ref 43.0–77.0)
Platelets: 352 10*3/uL (ref 150.0–400.0)
RBC: 4.4 Mil/uL (ref 3.87–5.11)
RDW: 13.3 % (ref 11.5–15.5)
WBC: 6.9 10*3/uL (ref 4.0–10.5)

## 2023-07-21 LAB — LIPID PANEL
Cholesterol: 152 mg/dL (ref 0–200)
HDL: 61.4 mg/dL (ref >39.00)
LDL Cholesterol: 74 mg/dL (ref 0–99)
NonHDL: 90.88
Total CHOL/HDL Ratio: 2
Triglycerides: 84 mg/dL (ref 0.0–149.0)
VLDL: 16.8 mg/dL (ref 0.0–40.0)

## 2023-07-21 LAB — HEPATIC FUNCTION PANEL
ALT: 17 U/L (ref 0–35)
AST: 18 U/L (ref 0–37)
Albumin: 4.4 g/dL (ref 3.5–5.2)
Alkaline Phosphatase: 68 U/L (ref 39–117)
Bilirubin, Direct: 0.1 mg/dL (ref 0.0–0.3)
Total Bilirubin: 0.5 mg/dL (ref 0.2–1.2)
Total Protein: 7.6 g/dL (ref 6.0–8.3)

## 2023-07-21 LAB — TSH: TSH: 1.62 u[IU]/mL (ref 0.35–5.50)

## 2023-07-21 LAB — BASIC METABOLIC PANEL
BUN: 25 mg/dL — ABNORMAL HIGH (ref 6–23)
CO2: 30 meq/L (ref 19–32)
Calcium: 10 mg/dL (ref 8.4–10.5)
Chloride: 100 meq/L (ref 96–112)
Creatinine, Ser: 0.81 mg/dL (ref 0.40–1.20)
GFR: 76.38 mL/min (ref >60.00)
Glucose, Bld: 128 mg/dL — ABNORMAL HIGH (ref 70–99)
Potassium: 4.6 meq/L (ref 3.5–5.1)
Sodium: 139 meq/L (ref 135–145)

## 2023-07-21 LAB — VITAMIN D 25 HYDROXY (VIT D DEFICIENCY, FRACTURES): VITD: 77.19 ng/mL (ref 30.00–100.00)

## 2023-07-21 LAB — HEMOGLOBIN A1C: Hgb A1c MFr Bld: 6.5 % (ref 4.6–6.5)

## 2023-07-21 MED ORDER — TRAZODONE HCL 50 MG PO TABS: 25.0000 mg | ORAL_TABLET | Freq: Every evening | ORAL | 3 refills | Status: AC | PRN

## 2023-07-21 NOTE — Patient Instructions (Signed)
Follow up as needed or as scheduled We'll notify you of your lab results and make any changes if needed Continue to work on healthy diet and regular exercise- you look great! They should call you to schedule your bone density test, but if not, you can go to [address] and have this done Call with any questions or concerns Stay Safe!  Stay Healthy! Happy Thanksgiving!!!

## 2023-07-21 NOTE — Assessment & Plan Note (Signed)
Pt's PE WNL.  UTD on mammo, pap, colonoscopy, Tdap, eye exam, microalbumin.  Due for Prevnar 20 and flu- given today.  Check labs.  Anticipatory guidance provided.

## 2023-07-21 NOTE — Assessment & Plan Note (Signed)
Pt reports she has a hx of this.  Was screened regularly for the first few years after her breast cancer dx but then screening stopped.  Ordered DEXA today.

## 2023-07-21 NOTE — Progress Notes (Signed)
Subjective:    Patient ID: Amber Washington, female    DOB: [birth year], 67 y.o.   MRN: [id number]  HPI Here today for Welcome To Medicare CPE.  Risk Factors: DM HTN Physical Activity: exercising regularly Fall Risk: low Depression: denies current sxs Hearing: normal to conversational tones and whispered voice at 6 ft ADL's: independent  Cognitive: normal mini cog Home Safety: safe at home Height, Weight, BMI, Visual Acuity: see vitals, vision corrected to 20/20 w/ glasses Counseling: UTD on Tdap, eye exam, microalbumin, foot exam, pap, colonoscopy.  Pt reports mammo was done in April or May- will attempt to get records.  Due for DEXA- hx of osteopenia.  Due for Prevnar 20. Healthcare POA/Living will: pt has both, will bring copies Labs Ordered: See A&P Care Plan: See A&P   Addiction risk- takes Alprazolam prn.  PDMP database reviewed, no red flags.  No drug use, no pain medication, no regular alcohol use, no tobacco use  Insomnia- pt reports she is having trouble sleeping despite taking Alprazolam.  Having a hard time turning off her brain.  Open to trying something else   Patient Care Team    Relationship Specialty Notifications Start End  Sheliah Hatch, MD PCP - General Family Medicine  02/16/14   Sharrell Ku, MD Consulting Physician Gastroenterology  06/21/15   Key, Verita Schneiders, NP Nurse Practitioner Gynecology  06/26/16   Ernesto Rutherford, MD Consulting Physician Ophthalmology  06/26/16     Health Maintenance  Topic Date Due   Medicare Annual Wellness (AWV)  Never done   MAMMOGRAM  10/29/2022   HEMOGLOBIN A1C  04/23/2023   COVID-19 Vaccine (6 - 2023-24 season) 05/10/2023   DEXA SCAN  07/06/2023   Pneumonia Vaccine 66+ Years old (2 of 2 - PCV) 07/06/2023   OPHTHALMOLOGY EXAM  09/24/2023   Diabetic kidney evaluation - eGFR measurement  01/08/2024   Diabetic kidney evaluation - Urine ACR  01/08/2024   FOOT EXAM  03/23/2024   Cervical Cancer Screening (HPV/Pap  Cotest)  10/25/2025   Colonoscopy  05/17/2028   DTaP/Tdap/Td (4 - Td or Tdap) 07/09/2032   Hepatitis C Screening  Completed   HIV Screening  Completed   Zoster Vaccines- Shingrix  Completed   HPV VACCINES  Aged Out      Review of Systems Patient reports no vision/ hearing changes, adenopathy,fever, weight change,  persistant/recurrent hoarseness , swallowing issues, chest pain, palpitations, edema, persistant/recurrent cough, hemoptysis, dyspnea (rest/exertional/paroxysmal nocturnal), gastrointestinal bleeding (melena, rectal bleeding), abdominal pain, significant heartburn, bowel changes, GU symptoms (dysuria, hematuria, incontinence), Gyn symptoms (abnormal  bleeding, pain),  syncope, focal weakness, memory loss, numbness & tingling, skin/hair/nail changes, abnormal bruising or bleeding, anxiety, or depression.     Objective:   Physical Exam General Appearance:    Alert, cooperative, no distress, appears stated age  Head:    Normocephalic, without obvious abnormality, atraumatic  Eyes:    PERRL, conjunctiva/corneas clear, EOM's intact both eyes  Ears:    Normal TM's and external ear canals, both ears  Nose:   Nares normal, septum midline, mucosa normal, no drainage    or sinus tenderness  Throat:   Lips, mucosa, and tongue normal; teeth and gums normal  Neck:   Supple, symmetrical, trachea midline, no adenopathy;    Thyroid: no enlargement/tenderness/nodules  Back:     Symmetric, no curvature, ROM normal, no CVA tenderness  Lungs:     Clear to auscultation bilaterally, respirations unlabored  Chest Wall:    No tenderness  or deformity   Heart:    Regular rate and rhythm, S1 and S2 normal, no murmur, rub   or gallop  Breast Exam:    Deferred to GYN  Abdomen:     Soft, non-tender, bowel sounds active all four quadrants,    no masses, no organomegaly  Genitalia:    Deferred to GYN  Rectal:    Extremities:   Extremities normal, atraumatic, no cyanosis or edema  Pulses:   2+ and  symmetric all extremities  Skin:   Skin color, texture, turgor normal, no rashes or lesions  Lymph nodes:   Cervical, supraclavicular, and axillary nodes normal  Neurologic:   CNII-XII intact, normal strength, sensation and reflexes    throughout          Assessment & Plan:

## 2023-07-21 NOTE — Assessment & Plan Note (Signed)
Deteriorated.  Pt reports she is struggling to sleep despite Alprazolam.  Has a lot going on right now- daughter is getting married and son is expecting a baby on the wedding date.  At this time, will re-try Trazodone and monitor for improvement.  Pt expressed understanding and is in agreement w/ plan.

## 2023-07-22 ENCOUNTER — Telehealth: Payer: Self-pay

## 2023-07-22 NOTE — Telephone Encounter (Signed)
-----   Message from Neena Rhymes sent at 07/22/2023  7:26 AM EST ----- Labs look good!  No changes at this time

## 2023-07-31 ENCOUNTER — Ambulatory Visit (INDEPENDENT_AMBULATORY_CARE_PROVIDER_SITE_OTHER): Payer: Medicare PPO | Admitting: Internal Medicine

## 2023-07-31 ENCOUNTER — Encounter: Payer: Self-pay | Admitting: Internal Medicine

## 2023-07-31 ENCOUNTER — Ambulatory Visit (INDEPENDENT_AMBULATORY_CARE_PROVIDER_SITE_OTHER)
Admission: RE | Admit: 2023-07-31 | Discharge: 2023-07-31 | Disposition: A | Discharge status: Home / Self Care | Payer: Medicare PPO | Source: Ambulatory Visit | Attending: Family Medicine | Admitting: Family Medicine

## 2023-07-31 VITALS — BP 136/84 | HR 83 | Ht 69.0 in | Wt 150.8 lb

## 2023-07-31 DIAGNOSIS — M8580 Other specified disorders of bone density and structure, unspecified site: Secondary | ICD-10-CM | POA: Diagnosis not present

## 2023-07-31 DIAGNOSIS — Z90411 Acquired partial absence of pancreas: Secondary | ICD-10-CM | POA: Diagnosis not present

## 2023-07-31 DIAGNOSIS — Z794 Long term (current) use of insulin: Secondary | ICD-10-CM

## 2023-07-31 DIAGNOSIS — E0865 Diabetes mellitus due to underlying condition with hyperglycemia: Secondary | ICD-10-CM

## 2023-07-31 DIAGNOSIS — E7800 Pure hypercholesterolemia, unspecified: Secondary | ICD-10-CM | POA: Diagnosis not present

## 2023-07-31 DIAGNOSIS — E1365 Other specified diabetes mellitus with hyperglycemia: Secondary | ICD-10-CM | POA: Diagnosis not present

## 2023-07-31 MED ORDER — RYBELSUS 7 MG PO TABS: 1.0000 | ORAL_TABLET | Freq: Every day | ORAL | 3 refills | Status: DC

## 2023-07-31 NOTE — Progress Notes (Signed)
Patient ID: GEORGAN REISE, female   DOB: 1956-07-22, 67 y.o.   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  Last visit 4 months ago.  Interim history: No increased urination, blurry vision, nausea, chest pain.   Since last visit, she adjusted her diet - eating healthier.  Sugars have improved.  Reviewed HbA1c levels: Lab Results  Component Value Date   HGBA1C 6.5 07/21/2023   HGBA1C 6.2 (A) 10/23/2022   HGBA1C 6.2 (A) 06/17/2022  03/24/2023: HbA1c 6.4%  She is on: - Rybelsus 3.5 mg before breakfast >> moved before Dinner >> 7 mg before breakfast - Metformin ER 500 >> 1000 mg twice a day with meals (diarrhea) >> .Marland KitchenMarland Kitchen 1000 mg with dinner >> 500 mg 2x a day (b/c loose stools) - Basaglar 5 units daily at bedtime >> ...8 >> 10 >> Lantus 8 units in a.m. - Novolog  6-8 >> 3-4 units before dinner and occasionally lunch >> 2-3-5 >> 2-4 >> 2-5 units before lunch and dinner + Sliding scale: - 130-180: + 1 unit  - 181-230: + 2 units  - 231-280: + 3 units  - >281: + 4 units  She was on Jardiance 25 mg before b'fast >> yeast inf's >> stopped. She was previously on Januvia, before changing to Rybelsus.  Pt checks sugars >4 times a day with her CGM - Dexcom G7 ([facility] - free for her now)  Previously:   Previously:  Lowest: 50s >> 60s >> 60 >> 58. Highest: 302 (with steroids) >> 200s >> 300 >> high 200s >> 200s  Glucometer: Bayer Contour  Pt's meals are: - Breakfast: oatmeal + yoghurt + crackers - Lunch: salads + meat/cheese/crackers + fruit - Dinner: chicken + veggies or salad - Snacks: nuts , crackers, carrots - 1-x a day She only drinks water. She is walking for exercise.  No CKD, last BUN/creatinine:  Lab Results  Component Value Date   BUN 25 (H) 07/21/2023   BUN 22 01/08/2023    CREATININE 0.81 07/21/2023   CREATININE 0.86 01/08/2023   Normal ACR: Lab Results  Component Value Date   MICRALBCREAT 5.2 01/08/2023   MICRALBCREAT 6.2 12/28/2019   MICRALBCREAT 3.4 06/28/2012   MICRALBCREAT 2.3 01/27/2011   MICRALBCREAT 3.2 02/05/2010  On losartan.  + HL; last set of lipids: Lab Results  Component Value Date   CHOL 152 07/21/2023   HDL 61.40 07/21/2023   LDLCALC 74 07/21/2023   TRIG 84.0 07/21/2023   CHOLHDL 2 07/21/2023  On Lipitor 10.  - last eye exam was: 09/23/2022: No DR.  -she denies numbness and tingling in her feet.  Last foot exam 03/24/2023.  She has a history of HTN, hematuria, chronic insomnia-Xanax helping.  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  ROS: + see HPI  I reviewed pt's medications, allergies, PMH, social hx, family hx, and changes were documented in the history of present illness. Otherwise, unchanged from my initial visit note.  Past Medical History:  Diagnosis Date   Allergic rhinitis    Anxiety state, unspecified    Asthma    Benign neoplasm of pancreas, except islets of Langerhans    Borderline hypertension    Hypercholesteremia    Malignant neoplasm of breast (female), unspecified site  Osteoporosis, unspecified    Personal history of chemotherapy    Personal history of radiation therapy    Stenosis of nasolacrimal duct, acquired    Type II or unspecified type diabetes mellitus without mention of complication, not stated as uncontrolled    Past Surgical History:  Procedure Laterality Date   BREAST BIOPSY     BREAST EXCISIONAL BIOPSY     BREAST LUMPECTOMY     [id number] 1995   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   NASAL SEPTUM SURGERY     right breast cancer sugery  1995   surgery on neck for removal of birthmark     tur induction     Social History   Social History   Marital status: Married    Spouse name:  N/A   Number of children: 2   Occupational History   Catering manager   Social History Main Topics   Smoking status: Never Smoker   Smokeless tobacco: Never Used   Alcohol use No   Drug use: No   Current Outpatient Medications on File Prior to Visit  Medication Sig Dispense Refill   ALPRAZolam (XANAX) 0.5 MG tablet TAKE ONE TABLET BY MOUTH THREE TIMES DAILY AS NEEDED FOR SLEEP OR FOR ANXIETY 90 tablet 1   aspirin 81 MG tablet Take 81 mg by mouth daily.     atorvastatin (LIPITOR) 10 MG tablet TAKE 1 TABLET DAILY *MYLAN* 90 tablet 1   Blood Glucose Monitoring Suppl (ACCU-CHEK GUIDE) w/Device KIT Use as directed 1 kit 0   budesonide-formoterol (SYMBICORT) 80-4.5 MCG/ACT inhaler Inhale 2 puffs into the lungs 2 (two) times daily. 1 each 3   Cholecalciferol (VITAMIN D) 2000 UNITS CAPS Take 1 capsule by mouth daily.     Continuous Glucose Sensor (DEXCOM G7 SENSOR) MISC Use as instructed to check blood sugar. Change every 10 days 9 each 3   fluticasone-salmeterol (ADVAIR) 250-50 MCG/ACT AEPB      glucose blood test strip      insulin aspart (NOVOLOG FLEXPEN) 100 UNIT/ML FlexPen Inject under skin 3-5 units before dinner and lunch 15 mL 3   insulin glargine (LANTUS SOLOSTAR) 100 UNIT/ML Solostar Pen Inject 8-10 Units into the skin daily. 15 mL 3   losartan (COZAAR) 50 MG tablet Take 1 tablet (50 mg total) by mouth daily. 180 tablet 1   metFORMIN (GLUCOPHAGE-XR) 500 MG 24 hr tablet TAKE 2 TABLETS DAILY WITH  SUPPER 180 tablet 3   montelukast (SINGULAIR) 10 MG tablet Take 1 tablet (10 mg total) by mouth at bedtime. 180 tablet 1   RYBELSUS 7 MG TABS TAKE 1 TABLET BY MOUTH DAILY 30 tablet 5   traZODone (DESYREL) 50 MG tablet Take 0.5-1 tablets (25-50 mg total) by mouth at bedtime as needed for sleep. 30 tablet 3   No current facility-administered medications on file prior to visit.   Allergies  Allergen Reactions   Influenza Vaccines Other (See Comments)    Unknown, allergic to egg whites  Other  reaction(s): Other (See Comments)  Unknown, allergic to egg whites   Bactrim [Sulfamethoxazole-Trimethoprim] Nausea And Vomiting and Other (See Comments)    Shaking chills   Egg-Derived Products    FH: - mother - leukemia, father - liver cancer - mother - HTN - DM in nephew.  PE: BP 136/84   Pulse 83   Ht 5\' 9"  (1.753 m)   Wt 150 lb 12.8 oz (68.4 kg)   SpO2 99%   BMI 22.27  kg/m   Wt Readings from Last 3 Encounters:  07/31/23 150 lb 12.8 oz (68.4 kg)  07/21/23 151 lb 4 oz (68.6 kg)  03/24/23 150 lb (68 kg)   Constitutional: normal weight, in NAD Eyes: EOMI, no exophthalmos, + arcus cornealis ENT: no thyromegaly, no cervical lymphadenopathy Cardiovascular: RRR, No MRG Respiratory: CTA B Musculoskeletal: no deformities Skin:  no rashes Neurological: no tremor with outstretched hands  ASSESSMENT: 1. DM, insulin-dependent, previously uncontrolled, without long term complications, but with hyperglycemia  Component     Latest Ref Rng & Units 12/04/2016  C-Peptide     0.80 - 3.85 ng/mL 0.94  Glucose, Fasting     65 - 99 mg/dL 865 (H)  C-peptide is not very high, but is still in the normal range.   She has a history of distal pancreatic resection (?%) For a benign cyst and she developed diabetes soon after the surgery.  2. HL  PLAN:  1. Patient with history of diabetes due to distal pancreatectomy for pancreatic cyst.  She is on a basal Bolus insulin regimen and also metformin and p.o. GLP-1 receptor agonist.  At last visit, HbA1c was slightly higher but still at goal at 6.4% and sugars were fluctuating within the target range with higher blood sugars occasionally after breakfast and more consistently after dinner.  She was taking Rybelsus for dinner to try to see if her sugars improved overnight but I advised her to move it back to the morning.  I also recommended to increase the dose, if tolerated.  Discussed about moving NovoLog 30 minutes instead of 15 minutes before meals  to see if this would alleviate postprandial hyperglycemia.  I advised her that if she did not, to increase the dose of NovoLog. -Earlier this month she had another HbA1c which was even higher, at 6.5%. CGM interpretation: -At today's visit, we reviewed her CGM downloads: It appears that 94% of values are in target range (goal >70%), while 5% are higher than 180 (goal <25%), and 1% are lower than 70 (goal <4%).  The calculated average blood sugar is 120.  The projected HbA1c for the next 3 months (GMI) is 6.2%. -Reviewing the CGM trends, sugars have improved and they are now fluctuating within the target range with only occasional hyperglycemia after lunch.  She does not have particular larger lunches, but we discussed that for larger meals, she may need to use slightly more NovoLog.  As of now, she is mostly using 3 units for meals.  I advised her to use 4 units for larger meal and possibly even 5 units for a holiday meal.  Otherwise, I do not feel we need to change her regimen.  She is doing better with the higher dose of Rybelsus moved to the morning.  The previously obtained HbA1c of 6.5% is not correlating with the more recent blood sugars. - I suggested to:  Patient Instructions  Please continue: - Metformin ER 500 mg 2x a day - Rybelsus 7 mg before b'fast - Lantus 8 units in a.m. - NovoLog 3-5 units 30 min before meals Also, use the following sliding scale for NovoLog: - 130-180: + 1 unit  - 181-230: + 2 units  - 231-280: + 3 units  - >281: + 4 units    Please return in 4 months.  - advised to check sugars at different times of the day - 4x a day, rotating check times - advised for yearly eye exams >> she is UTD - return  to clinic in 4 months  2. HL -She has arcus cornealis -Reviewed latest lipid panel from earlier this month: Fractions at goal: Lab Results  Component Value Date   CHOL 152 07/21/2023   HDL 61.40 07/21/2023   LDLCALC 74 07/21/2023   TRIG 84.0 07/21/2023    CHOLHDL 2 07/21/2023  -She is on Lipitor 10 mg daily without side effects  Carlus Pavlov, MD PhD [facility]

## 2023-07-31 NOTE — Patient Instructions (Signed)
Please continue: - Metformin ER 500 mg 2x a day - Rybelsus 7 mg before b'fast - Lantus 8 units in a.m. - NovoLog 3-5 units 30 min before meals Also, use the following sliding scale for NovoLog: - 130-180: + 1 unit  - 181-230: + 2 units  - 231-280: + 3 units  - >281: + 4 units               Please return in 4 months.

## 2023-08-04 ENCOUNTER — Other Ambulatory Visit: Payer: Self-pay

## 2023-08-04 ENCOUNTER — Encounter: Payer: Self-pay | Admitting: Family Medicine

## 2023-08-04 MED ORDER — ALENDRONATE SODIUM 70 MG PO TABS: 70.0000 mg | ORAL_TABLET | ORAL | 3 refills | Status: DC

## 2023-08-11 DIAGNOSIS — L821 Other seborrheic keratosis: Secondary | ICD-10-CM | POA: Diagnosis not present

## 2023-08-11 DIAGNOSIS — Z7189 Other specified counseling: Secondary | ICD-10-CM | POA: Diagnosis not present

## 2023-08-11 DIAGNOSIS — L814 Other melanin hyperpigmentation: Secondary | ICD-10-CM | POA: Diagnosis not present

## 2023-08-11 DIAGNOSIS — D225 Melanocytic nevi of trunk: Secondary | ICD-10-CM | POA: Diagnosis not present

## 2023-09-07 ENCOUNTER — Encounter: Payer: Self-pay | Admitting: Family Medicine

## 2023-09-07 ENCOUNTER — Ambulatory Visit: Payer: Self-pay | Admitting: Family Medicine

## 2023-09-07 NOTE — Telephone Encounter (Signed)
  Chief Complaint: Covid positive with severe sinus pain Symptoms: Sinus pressure and pain in face not relieved by medication Frequency: constant Pertinent Negatives: Patient denies fever, cough, difficulty breathing Disposition: [] ED /[] Urgent Care (no appt availability in office) / [] Appointment(In office/virtual)/ [x]  [facility]/ [] Home Care/ [] Refused Recommended Disposition /[] [facility]/ []  Follow-up with PCP Additional Notes: Patient stated she tested positive for Covid yesterday morning after husband was positive on Friday. States her sinus pressure and pain is a 9/10 and unrelieved by any type of pain medication or mucinex. Patient denies fever, difficulty breathing, or coughing.    Copied from CRM [id number]. Topic: Clinical - Pink Word Triage >> Sep 07, 2023 11:48 AM Kathryne Eriksson wrote: Reason for Triage: Positive For COVID >> Sep 07, 2023 11:51 AM Kathryne Eriksson wrote: Patient has severe headache and sinus pain. She has been taking Mucinex congestion with a pain reliever and it is not touching the pain. Also, Tylenol and Ibuprofen in an effort to get relief. So far my lungs are clear with no coughing. Patient is requesting a call back in regards to medication / prescription that'll help.  Reason for Disposition  [1] SEVERE pain AND [2] not improved 2 hours after pain medicine  Answer Assessment - Initial Assessment Questions 1. LOCATION: "Where does it hurt?"      All in head, severe sinus pain 2. ONSET: "When did the sinus pain start?"  (e.g., hours, days)      Yesterday morning, progressively getting worse 3. SEVERITY: "How bad is the pain?"   (Scale 1-10; mild, moderate or severe)   - MILD (1-3): doesn't interfere with normal activities    - MODERATE (4-7): interferes with normal activities (e.g., work or school) or awakens from sleep   - SEVERE (8-10): excruciating pain and patient unable to do any normal activities        9 4. RECURRENT SYMPTOM: "Have  you ever had sinus problems before?" If Yes, ask: "When was the last time?" and "What happened that time?"      The pain is a little bit abnormal compared to what she is used to 5. NASAL CONGESTION: "Is the nose blocked?" If Yes, ask: "Can you open it or must you breathe through your mouth?"     Cannot close mouth and breathe through nose fully, very heavy nose congestion 6. NASAL DISCHARGE: "Do you have discharge from your nose?" If so ask, "What color?"     Some dripping, clear. When she blew nose, it was cloudy but didn't look infected 7. FEVER: "Do you have a fever?" If Yes, ask: "What is it, how was it measured, and when did it start?"      No fever 8. OTHER SYMPTOMS: "Do you have any other symptoms?" (e.g., sore throat, cough, earache, difficulty breathing)     Immense pressure in head, some ear pressure,  Protocols used: Sinus Pain or Congestion-A-AH

## 2023-09-07 NOTE — Telephone Encounter (Signed)
Pt has MyChart visit tomorrow and let us know if further assistance is needed afterwards

## 2023-09-07 NOTE — Telephone Encounter (Signed)
Unfortunatly we have not seen the patient for these concerns she will need an appointment

## 2023-09-08 ENCOUNTER — Telehealth (INDEPENDENT_AMBULATORY_CARE_PROVIDER_SITE_OTHER): Payer: Medicare PPO | Admitting: Internal Medicine

## 2023-09-08 ENCOUNTER — Encounter: Payer: Self-pay | Admitting: Internal Medicine

## 2023-09-08 DIAGNOSIS — U071 COVID-19: Secondary | ICD-10-CM | POA: Insufficient documentation

## 2023-09-08 MED ORDER — TRAMADOL HCL 50 MG PO TABS: 50.0000 mg | ORAL_TABLET | Freq: Three times a day (TID) | ORAL | 0 refills | Status: AC | PRN

## 2023-09-08 MED ORDER — NIRMATRELVIR/RITONAVIR (PAXLOVID)TABLET: 3.0000 | ORAL_TABLET | Freq: Two times a day (BID) | ORAL | 0 refills | Status: AC

## 2023-09-08 NOTE — Assessment & Plan Note (Signed)
Given concurrent medical conditions rx paxlovid treatment. Renal function adequate for full dosing. Advised due to illness watch sugars closely. Rx tramadol to help with head pain/pressure as otc are not effective.

## 2023-09-08 NOTE — Progress Notes (Signed)
 Virtual Visit via Video Note  I connected with Amber Washington on 09/08/23 at 11:40 AM EST by a video enabled telemedicine application and verified that I am speaking with the correct person using two identifiers.  The patient and the provider were at separate locations throughout the entire encounter. Patient location: home, Provider location: work   I discussed the limitations of evaluation and management by telemedicine and the availability of in person appointments. The patient expressed understanding and agreed to proceed. The patient and the provider were the only parties present for the visit unless noted in HPI below.  History of Present Illness: The patient is a 67 y.o. female with visit for covid-19. Started sunday. Has sinus pressure and pain. Denies SOB or bad cough.   Observations/Objective: Appearance: normal, breathing appears normal some coughing, casual grooming, mental status is A and O times 3  Assessment and Plan: See problem oriented charting  Follow Up Instructions: rx paxlovid  and tramadol  for head pain  I discussed the assessment and treatment plan with the patient. The patient was provided an opportunity to ask questions and all were answered. The patient agreed with the plan and demonstrated an understanding of the instructions.   The patient was advised to call back or seek an in-person evaluation if the symptoms worsen or if the condition fails to improve as anticipated.  Amber DELENA Cleveland, MD

## 2023-09-11 NOTE — Telephone Encounter (Signed)
 Copied from CRM [id number]. Topic: Clinical - Medical Advice >> Sep 11, 2023 11:25 AM Corin V wrote: Reason for CRM: Patient called in stating she tested positive for COVID Sunday. She saw Dr. Rollene on Tuesday and the Rx prescribed have helped slightly. She has been taking mucinex  since Sunday. She is not sure how long she can or should keep taking it. The sinus pressure is gone but there is still pressure through her head. She is still very fatigued and spent all day yesterday in bed. She is not taking much of the pain medication prescribed and is only taking it PRN at night to help her sleep. She is not sure if it is just COVID and it just needs to run its course or if there is anything else she can do or take to alleviate symptoms.

## 2023-09-14 ENCOUNTER — Ambulatory Visit (INDEPENDENT_AMBULATORY_CARE_PROVIDER_SITE_OTHER): Payer: Medicare Other | Admitting: Family Medicine

## 2023-09-14 ENCOUNTER — Encounter: Payer: Self-pay | Admitting: Family Medicine

## 2023-09-14 VITALS — BP 130/70 | HR 85 | Temp 97.9°F | Ht 69.0 in | Wt 146.1 lb

## 2023-09-14 DIAGNOSIS — U071 COVID-19: Secondary | ICD-10-CM | POA: Diagnosis not present

## 2023-09-14 DIAGNOSIS — J4541 Moderate persistent asthma with (acute) exacerbation: Secondary | ICD-10-CM

## 2023-09-14 MED ORDER — ALBUTEROL SULFATE HFA 108 (90 BASE) MCG/ACT IN AERS
2.0000 | INHALATION_SPRAY | Freq: Four times a day (QID) | RESPIRATORY_TRACT | 3 refills | Status: AC | PRN
Start: 2023-09-14 — End: ?

## 2023-09-14 MED ORDER — BUDESONIDE-FORMOTEROL FUMARATE 80-4.5 MCG/ACT IN AERO: 2.0000 | INHALATION_SPRAY | Freq: Two times a day (BID) | RESPIRATORY_TRACT | 3 refills | Status: AC

## 2023-09-14 MED ORDER — ALPRAZOLAM 0.5 MG PO TABS: ORAL_TABLET | ORAL | 1 refills | Status: DC

## 2023-09-14 NOTE — Patient Instructions (Signed)
 Follow up as needed or as scheduled START the Symbicort twice daily until feeling better USE the Albuterol as needed for cough or wheezing or tightness Lots of fluids!  Lots of rest! Call with any questions or concerns Hang in there!!

## 2023-09-14 NOTE — Progress Notes (Signed)
   Subjective:    Patient ID: Amber Washington, female    DOB: Sep 25, 1955, 68 y.o.   MRN: [id number]  HPI COVID- pt is on day 9.  Pt completed course of Paxlovid .  Pt reports ongoing fatigue.  HA is slowly getting better.  Has recently started wheezing and having chest tightness.  Denies SOB.  Using Symbicort  and Albuterol  but both are expired.   Review of Systems For ROS see HPI     Objective:   Physical Exam Vitals reviewed.  Constitutional:      General: She is not in acute distress.    Appearance: Normal appearance. She is not ill-appearing.  HENT:     Head: Normocephalic and atraumatic.     Nose: Congestion present. No rhinorrhea.  Cardiovascular:     Rate and Rhythm: Normal rate and regular rhythm.  Pulmonary:     Effort: Pulmonary effort is normal. No respiratory distress.     Breath sounds: Normal breath sounds. No wheezing, rhonchi or rales.     Comments: + dry cough Musculoskeletal:     Cervical back: Neck supple.  Lymphadenopathy:     Cervical: No cervical adenopathy.  Skin:    General: Skin is warm and dry.  Neurological:     General: No focal deficit present.     Mental Status: She is alert and oriented to person, place, and time.  Psychiatric:        Mood and Affect: Mood normal.        Behavior: Behavior normal.        Thought Content: Thought content normal.           Assessment & Plan:  COVID- new.  Pt completed Paxlovid  2 days ago.  Reports that she is starting to feel better but she is concerned bc she has had increased chest tightness and wheezing.  No wheezing on exam today, but will treat as COVID triggered asthma exacerbation.  Refills provided on both Symbicort  and Albuterol .  Reviewed supportive care and red flags that should prompt return.  Pt expressed understanding and is in agreement w/ plan.   Asthma- deteriorated.  Likely COVID triggered.  Provided refill on Symbicort  and Albuterol .  Reviewed supportive care and red flags that should prompt  return.  Pt expressed understanding and is in agreement w/ plan.

## 2023-09-22 ENCOUNTER — Telehealth: Payer: Self-pay | Admitting: Family Medicine

## 2023-09-22 DIAGNOSIS — M9901 Segmental and somatic dysfunction of cervical region: Secondary | ICD-10-CM | POA: Diagnosis not present

## 2023-09-22 DIAGNOSIS — S29012A Strain of muscle and tendon of back wall of thorax, initial encounter: Secondary | ICD-10-CM | POA: Diagnosis not present

## 2023-09-22 DIAGNOSIS — M9905 Segmental and somatic dysfunction of pelvic region: Secondary | ICD-10-CM | POA: Diagnosis not present

## 2023-09-22 DIAGNOSIS — M9903 Segmental and somatic dysfunction of lumbar region: Secondary | ICD-10-CM | POA: Diagnosis not present

## 2023-09-22 DIAGNOSIS — M47816 Spondylosis without myelopathy or radiculopathy, lumbar region: Secondary | ICD-10-CM | POA: Diagnosis not present

## 2023-09-22 DIAGNOSIS — M9902 Segmental and somatic dysfunction of thoracic region: Secondary | ICD-10-CM | POA: Diagnosis not present

## 2023-09-22 DIAGNOSIS — S138XXA Sprain of joints and ligaments of other parts of neck, initial encounter: Secondary | ICD-10-CM | POA: Diagnosis not present

## 2023-09-22 NOTE — Telephone Encounter (Signed)
 Reviewed forms.  Sent to scan

## 2023-09-22 NOTE — Telephone Encounter (Signed)
 Type of form received:[employer] Advantage [employer]   Additional comments:   Received ab:[id number] Desk  Form should be Faxed/mailed to: N/A  Is patient requesting call for pickup:N/A  Form placed:  [employer] charge sheet.  Provider will determine charge.N/A  Individual made aware of 3-5 business day turn around No?

## 2023-09-30 DIAGNOSIS — H10413 Chronic giant papillary conjunctivitis, bilateral: Secondary | ICD-10-CM | POA: Diagnosis not present

## 2023-09-30 DIAGNOSIS — H04413 Chronic dacryocystitis of bilateral lacrimal passages: Secondary | ICD-10-CM | POA: Diagnosis not present

## 2023-09-30 DIAGNOSIS — H01131 Eczematous dermatitis of right upper eyelid: Secondary | ICD-10-CM | POA: Diagnosis not present

## 2023-09-30 DIAGNOSIS — H04223 Epiphora due to insufficient drainage, bilateral lacrimal glands: Secondary | ICD-10-CM | POA: Diagnosis not present

## 2023-09-30 DIAGNOSIS — E119 Type 2 diabetes mellitus without complications: Secondary | ICD-10-CM | POA: Diagnosis not present

## 2023-09-30 DIAGNOSIS — H04123 Dry eye syndrome of bilateral lacrimal glands: Secondary | ICD-10-CM | POA: Diagnosis not present

## 2023-09-30 DIAGNOSIS — H01134 Eczematous dermatitis of left upper eyelid: Secondary | ICD-10-CM | POA: Diagnosis not present

## 2023-09-30 DIAGNOSIS — H2513 Age-related nuclear cataract, bilateral: Secondary | ICD-10-CM | POA: Diagnosis not present

## 2023-09-30 LAB — HM DIABETES EYE EXAM

## 2023-10-12 ENCOUNTER — Encounter: Payer: Self-pay | Admitting: Internal Medicine

## 2023-10-12 DIAGNOSIS — E0865 Diabetes mellitus due to underlying condition with hyperglycemia: Secondary | ICD-10-CM

## 2023-10-12 MED ORDER — DEXCOM G7 SENSOR MISC: 3 refills | Status: DC

## 2023-10-12 NOTE — Addendum Note (Signed)
Addended by: Pollie Meyer on: 10/12/2023 10:18 AM   Modules accepted: Orders

## 2023-10-13 MED ORDER — DEXCOM G7 SENSOR MISC: 0 refills | Status: DC

## 2023-10-13 NOTE — Addendum Note (Signed)
Addended by: Pollie Meyer on: 10/13/2023 01:40 PM   Modules accepted: Orders

## 2023-10-19 ENCOUNTER — Encounter: Payer: Self-pay | Admitting: Family Medicine

## 2023-10-19 ENCOUNTER — Encounter: Payer: Self-pay | Admitting: Internal Medicine

## 2023-10-19 DIAGNOSIS — Z794 Long term (current) use of insulin: Secondary | ICD-10-CM

## 2023-10-20 DIAGNOSIS — S29012A Strain of muscle and tendon of back wall of thorax, initial encounter: Secondary | ICD-10-CM | POA: Diagnosis not present

## 2023-10-20 DIAGNOSIS — M9902 Segmental and somatic dysfunction of thoracic region: Secondary | ICD-10-CM | POA: Diagnosis not present

## 2023-10-20 DIAGNOSIS — M9905 Segmental and somatic dysfunction of pelvic region: Secondary | ICD-10-CM | POA: Diagnosis not present

## 2023-10-20 DIAGNOSIS — M9901 Segmental and somatic dysfunction of cervical region: Secondary | ICD-10-CM | POA: Diagnosis not present

## 2023-10-20 DIAGNOSIS — M9903 Segmental and somatic dysfunction of lumbar region: Secondary | ICD-10-CM | POA: Diagnosis not present

## 2023-10-20 DIAGNOSIS — M47816 Spondylosis without myelopathy or radiculopathy, lumbar region: Secondary | ICD-10-CM | POA: Diagnosis not present

## 2023-10-20 DIAGNOSIS — S138XXA Sprain of joints and ligaments of other parts of neck, initial encounter: Secondary | ICD-10-CM | POA: Diagnosis not present

## 2023-10-20 MED ORDER — METFORMIN HCL ER 500 MG PO TB24: 1000.0000 mg | ORAL_TABLET | Freq: Every day | ORAL | 3 refills | Status: AC

## 2023-10-29 ENCOUNTER — Telehealth: Payer: Self-pay

## 2023-10-29 MED ORDER — ACCU-CHEK GUIDE TEST VI STRP: ORAL_STRIP | 12 refills | Status: AC

## 2023-10-29 NOTE — Addendum Note (Signed)
 Addended by: Pollie Meyer on: 10/29/2023 12:26 PM   Modules accepted: Orders

## 2023-10-29 NOTE — Telephone Encounter (Signed)
 Spoke with the patient via phone and she will be coming by to pick up a sensor. Also I send in a script for her test strips.   Amber Washington

## 2023-10-29 NOTE — Telephone Encounter (Signed)
 Sample  Device/Supplies: Dexcom G7 Quantity:2 WUJ:[id number] EXP:12/06/2024  Dicie Beam

## 2023-10-29 NOTE — Telephone Encounter (Signed)
Patient came in to office today and picked up 2 samples of Dexcom G7 sensors.

## 2023-11-16 ENCOUNTER — Other Ambulatory Visit: Payer: Self-pay | Admitting: Internal Medicine

## 2023-11-16 ENCOUNTER — Other Ambulatory Visit: Payer: Self-pay | Admitting: Family Medicine

## 2023-11-16 DIAGNOSIS — E0865 Diabetes mellitus due to underlying condition with hyperglycemia: Secondary | ICD-10-CM

## 2023-11-17 ENCOUNTER — Telehealth: Payer: Self-pay | Admitting: Pharmacy Technician

## 2023-11-17 ENCOUNTER — Other Ambulatory Visit (HOSPITAL_COMMUNITY): Payer: Self-pay

## 2023-11-17 DIAGNOSIS — M9905 Segmental and somatic dysfunction of pelvic region: Secondary | ICD-10-CM | POA: Diagnosis not present

## 2023-11-17 DIAGNOSIS — M9903 Segmental and somatic dysfunction of lumbar region: Secondary | ICD-10-CM | POA: Diagnosis not present

## 2023-11-17 DIAGNOSIS — M47816 Spondylosis without myelopathy or radiculopathy, lumbar region: Secondary | ICD-10-CM | POA: Diagnosis not present

## 2023-11-17 DIAGNOSIS — S29012A Strain of muscle and tendon of back wall of thorax, initial encounter: Secondary | ICD-10-CM | POA: Diagnosis not present

## 2023-11-17 DIAGNOSIS — S138XXA Sprain of joints and ligaments of other parts of neck, initial encounter: Secondary | ICD-10-CM | POA: Diagnosis not present

## 2023-11-17 DIAGNOSIS — M9902 Segmental and somatic dysfunction of thoracic region: Secondary | ICD-10-CM | POA: Diagnosis not present

## 2023-11-17 DIAGNOSIS — M9901 Segmental and somatic dysfunction of cervical region: Secondary | ICD-10-CM | POA: Diagnosis not present

## 2023-11-17 MED ORDER — DEXCOM G7 SENSOR MISC: 1 refills | Status: DC

## 2023-11-17 NOTE — Telephone Encounter (Signed)
 Pharmacy Patient Advocate Encounter   Received notification from CoverMyMeds that prior authorization for Dexcom G7 is required/requested.   Insurance verification completed.   The patient is insured through [facility] says AARP).   Per test claim: PA required; PA submitted to above mentioned insurance via CoverMyMeds Key/confirmation #/EOC [id number] Status is pending  Test claim goes through for a 30 day supply, but says this. Claim may have to go through Part B, not D.

## 2023-11-24 NOTE — Telephone Encounter (Signed)
 Pharmacy Patient Advocate Encounter  Received notification from [facility] that Prior Authorization for Dexcom G7 Sensor has been APPROVED from 11/17/2023 to 11/16/2024   PA #/Case ID/Reference #: [id number]

## 2023-12-05 ENCOUNTER — Other Ambulatory Visit: Payer: Self-pay | Admitting: Internal Medicine

## 2023-12-15 DIAGNOSIS — S29012A Strain of muscle and tendon of back wall of thorax, initial encounter: Secondary | ICD-10-CM | POA: Diagnosis not present

## 2023-12-15 DIAGNOSIS — M9905 Segmental and somatic dysfunction of pelvic region: Secondary | ICD-10-CM | POA: Diagnosis not present

## 2023-12-15 DIAGNOSIS — S138XXA Sprain of joints and ligaments of other parts of neck, initial encounter: Secondary | ICD-10-CM | POA: Diagnosis not present

## 2023-12-15 DIAGNOSIS — M47816 Spondylosis without myelopathy or radiculopathy, lumbar region: Secondary | ICD-10-CM | POA: Diagnosis not present

## 2023-12-15 DIAGNOSIS — M9902 Segmental and somatic dysfunction of thoracic region: Secondary | ICD-10-CM | POA: Diagnosis not present

## 2023-12-15 DIAGNOSIS — M9903 Segmental and somatic dysfunction of lumbar region: Secondary | ICD-10-CM | POA: Diagnosis not present

## 2023-12-15 DIAGNOSIS — M9901 Segmental and somatic dysfunction of cervical region: Secondary | ICD-10-CM | POA: Diagnosis not present

## 2023-12-29 NOTE — Progress Notes (Unsigned)
 Patient ID: Amber Washington, female   DOB: 09-26-1955, 68 y.o.   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  Last visit 5 months ago.  Interim history: No increased urination, blurry vision, nausea, chest pain.   She was very busy in the last few months: Daughter's wedding and also has a new grandbaby (her son's).  Reviewed HbA1c levels: Lab Results  Component Value Date   HGBA1C 6.5 07/21/2023   HGBA1C 6.4 03/24/2023   HGBA1C 6.2 (A) 10/23/2022  03/24/2023: HbA1c 6.4%  She is on: - Rybelsus  3.5 mg before breakfast >> moved before Dinner >> 7 mg before breakfast - Metformin  ER 500 >> 1000 mg twice a day with meals (diarrhea) >> .Aaron AasAaron Aas 1000 mg with dinner >> 500 mg 2x a day (b/c loose stools) - Basaglar  5 units daily at bedtime >> ...8 >> 10 >> Lantus  8 units in a.m. - Novolog   6-8 >> .Aaron AasAaron Aas2-5 >> 3-5 units before lunch and dinner + Sliding scale: - 130-180: + 1 unit  - 181-230: + 2 units  - 231-280: + 3 units  - >281: + 4 units  She was on Jardiance  25 mg before b'fast >> yeast inf's >> stopped. She was previously on Januvia , before changing to Rybelsus .  Pt checks sugars >4 times a day with her CGM - Dexcom G7 ([facility] - free for her now):  Previously:  Previously:   Lowest: 60s >> 60 >> 58 >> 60s. Highest: 300 >> high 200s >> 200s >> 200s.  Glucometer: [employer]  Pt's meals are: - Breakfast: oatmeal + yoghurt + crackers - Lunch: salads + meat/cheese/crackers + fruit - Dinner: chicken + veggies or salad - Snacks: nuts , crackers, carrots - 1-x a day She only drinks water. She is walking for exercise.  No CKD, last BUN/creatinine:  Lab Results  Component Value Date   BUN 25 (H) 07/21/2023   BUN 22 01/08/2023   CREATININE 0.81 07/21/2023   CREATININE 0.86 01/08/2023    Normal ACR: Lab Results  Component Value Date   MICRALBCREAT 5.2 01/08/2023   MICRALBCREAT 6.2 12/28/2019   MICRALBCREAT 3.4 06/28/2012   MICRALBCREAT 2.3 01/27/2011   MICRALBCREAT 3.2 02/05/2010  On losartan .  + HL; last set of lipids: Lab Results  Component Value Date   CHOL 152 07/21/2023   HDL 61.40 07/21/2023   LDLCALC 74 07/21/2023   TRIG 84.0 07/21/2023   CHOLHDL 2 07/21/2023  On Lipitor 10.  - last eye exam was: 09/2023 - No DR reportedly.    -she denies numbness and tingling in her feet.  Last foot exam 03/24/2023.  She has a history of HTN, hematuria, chronic insomnia-Xanax  helping.  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  ROS: + see HPI  I reviewed pt's medications, allergies, PMH, social hx, family hx, and changes were documented in the history of present illness. Otherwise, unchanged from my initial visit note.  Past Medical History:  Diagnosis Date   Allergic rhinitis    Anxiety state, unspecified    Asthma    Benign neoplasm of pancreas, except islets of Langerhans    Borderline hypertension    Hypercholesteremia    Malignant neoplasm of breast (female), unspecified site    Osteoporosis, unspecified    Personal  history of chemotherapy    Personal history of radiation therapy    Stenosis of nasolacrimal duct, acquired    Type II or unspecified type diabetes mellitus without mention of complication, not stated as uncontrolled    Past Surgical History:  Procedure Laterality Date   BREAST BIOPSY     BREAST EXCISIONAL BIOPSY     BREAST LUMPECTOMY     [id number] 1995   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   NASAL SEPTUM SURGERY     right breast cancer sugery  1995   surgery on neck for removal of birthmark     tur induction     Social History   Social History   Marital status: Married    Spouse name: N/A   Number of children: 2   Occupational  History   Catering manager   Social History Main Topics   Smoking status: Never Smoker   Smokeless tobacco: Never Used   Alcohol use No   Drug use: No   Current Outpatient Medications on File Prior to Visit  Medication Sig Dispense Refill   albuterol  (VENTOLIN  HFA) 108 (90 Base) MCG/ACT inhaler Inhale 2 puffs into the lungs every 6 (six) hours as needed for wheezing or shortness of breath. 8 g 3   alendronate  (FOSAMAX ) 70 MG tablet Take 1 tablet (70 mg total) by mouth every 7 (seven) days. Take with a full glass of water on an empty stomach. 12 tablet 3   ALPRAZolam  (XANAX ) 0.5 MG tablet TAKE ONE TABLET BY MOUTH THREE TIMES DAILY AS NEEDED FOR SLEEP OR FOR ANXIETY 90 tablet 1   aspirin 81 MG tablet Take 81 mg by mouth daily.     atorvastatin  (LIPITOR) 10 MG tablet TAKE 1 TABLET DAILY *MYLAN* 90 tablet 1   BD PEN NEEDLE NANO U/F 32G X 4 MM MISC USE 2 TO 3 TIMES A DAY 200 each 4   Blood Glucose Monitoring Suppl (ACCU-CHEK GUIDE) w/Device KIT Use as directed 1 kit 0   budesonide -formoterol  (SYMBICORT ) 80-4.5 MCG/ACT inhaler Inhale 2 puffs into the lungs 2 (two) times daily. 1 each 3   Cholecalciferol (VITAMIN D ) 2000 UNITS CAPS Take 1 capsule by mouth daily.     Continuous Glucose Sensor (DEXCOM G7 SENSOR) MISC Use as instructed to check blood sugar. Change every 10 days 9 each 1   glucose blood (ACCU-CHEK GUIDE TEST) test strip Use to check blood sugar 3-4 times a day. 400 each 12   insulin  aspart (NOVOLOG  FLEXPEN) 100 UNIT/ML FlexPen Inject under skin 3-5 units before dinner and lunch 15 mL 3   insulin  glargine (LANTUS  SOLOSTAR) 100 UNIT/ML Solostar Pen Inject 8-10 Units into the skin daily. 15 mL 3   losartan  (COZAAR ) 50 MG tablet Take 1 tablet (50 mg total) by mouth daily. 180 tablet 1   metFORMIN  (GLUCOPHAGE -XR) 500 MG 24 hr tablet Take 2 tablets (1,000 mg total) by mouth daily with supper. 180 tablet 3   montelukast  (SINGULAIR ) 10 MG tablet Take 1 tablet (10 mg total) by mouth at bedtime. 180  tablet 1   Semaglutide  (RYBELSUS ) 7 MG TABS Take 1 tablet (7 mg total) by mouth daily. 90 tablet 3   traZODone  (DESYREL ) 50 MG tablet Take 0.5-1 tablets (25-50 mg total) by mouth at bedtime as needed for sleep. 30 tablet 3   No current facility-administered medications on file prior to visit.   Allergies  Allergen Reactions   Influenza Vaccines Other (See Comments)  Unknown, allergic to egg whites  Other reaction(s): Other (See Comments)  Unknown, allergic to egg whites   Bactrim  [Sulfamethoxazole -Trimethoprim ] Nausea And Vomiting and Other (See Comments)    Shaking chills   Egg-Derived Products    FH: - mother - leukemia, father - liver cancer - mother - HTN - DM in nephew.  PE: BP 120/80   Pulse 78   Ht 5\' 9"  (1.753 m)   Wt 148 lb 6.4 oz (67.3 kg)   SpO2 97%   BMI 21.91 kg/m   Wt Readings from Last 3 Encounters:  12/30/23 148 lb 6.4 oz (67.3 kg)  09/14/23 146 lb 2 oz (66.3 kg)  07/31/23 150 lb 12.8 oz (68.4 kg)   Constitutional: normal weight, in NAD Eyes: EOMI, no exophthalmos, + arcus cornealis ENT: no thyromegaly, no cervical lymphadenopathy Cardiovascular: RRR, No MRG Respiratory: CTA B Musculoskeletal: no deformities Skin:  no rashes Neurological: no tremor with outstretched hands  ASSESSMENT: 1. DM, insulin -dependent, previously uncontrolled, without long term complications, but with hyperglycemia  Component     Latest Ref Rng & Units 12/04/2016  C-Peptide     0.80 - 3.85 ng/mL 0.94  Glucose, Fasting     65 - 99 mg/dL 161 (H)  C-peptide is not very high, but is still in the normal range.   She has a history of distal pancreatic resection (?%) For a benign cyst and she developed diabetes soon after the surgery.  2. HL  PLAN:  1. Patient with history of diabetes due to distal pancreatectomy for pancreatic cyst.  She is on a basal/bolus insulin  regimen along with metformin  and p.o. GLP-1 receptor agonist.  At last visit, HbA1c was still at goal, at  6.5%, stable.  Sugars appear to have been improved and fluctuating mostly within the target range with only occasional hyperglycemia after lunch.  She did not have particular larger lunches but we did discuss that for larger meals she may need to use slightly more NovoLog .  Otherwise, we did not change the regimen. CGM interpretation: -At today's visit, we reviewed her CGM downloads: It appears that 92% of values are in target range (goal >70%), while 7% are higher than 180 (goal <25%), and 1% are lower than 70 (goal <4%).  The calculated average blood sugar is 124.  The projected HbA1c for the next 3 months (GMI) is 6.3%. -Reviewing the CGM trends, sugars appear to be mostly at goal, excellent overnight and increasing slightly after meals, but not consistently.  She had a higher hyperglycemic peaks after Easter lunch despite taking 5 units of NovoLog  so we discussed that for larger meals, she may need to take 6 units, but otherwise, I did not suggest any changes in her regimen. -She does not have a glucagon  kit at home so I sent a prescription to her pharmacy -She had the PA approved for her Dexcom G7 sensors through [facility] so I sent another prescription to this pharmacy, rather than the local pharmacy - I suggested to:  Patient Instructions  Please use the following regimen: - Metformin  ER 500 mg 2x a day - Rybelsus  7 mg before b'fast - Lantus  8 units in a.m. - NovoLog  3-6 units 30 min before meals Also, use the following sliding scale for NovoLog : - 130-180: + 1 unit  - 181-230: + 2 units  - 231-280: + 3 units  - >281: + 4 units    Please return in 4-6 months.  - we checked her HbA1c: 6.3% (lower) -  advised to check sugars at different times of the day - 4x a day, rotating check times - advised for yearly eye exams >> she is UTD - return to clinic in 4-6 months  2. HL -She has arcus cornealis - Lipid panel was reviewed from 07/2023: Fractions at goal: Lab Results  Component  Value Date   CHOL 152 07/21/2023   HDL 61.40 07/21/2023   LDLCALC 74 07/21/2023   TRIG 84.0 07/21/2023   CHOLHDL 2 07/21/2023  -She denies Lipitor 10 mg daily without side effects  Emilie Harden, MD PhD [facility]

## 2023-12-30 ENCOUNTER — Ambulatory Visit (INDEPENDENT_AMBULATORY_CARE_PROVIDER_SITE_OTHER): Payer: Medicare PPO | Admitting: Internal Medicine

## 2023-12-30 ENCOUNTER — Encounter: Payer: Self-pay | Admitting: Internal Medicine

## 2023-12-30 VITALS — BP 120/80 | HR 78 | Ht 69.0 in | Wt 148.4 lb

## 2023-12-30 DIAGNOSIS — Z90411 Acquired partial absence of pancreas: Secondary | ICD-10-CM | POA: Diagnosis not present

## 2023-12-30 DIAGNOSIS — E7800 Pure hypercholesterolemia, unspecified: Secondary | ICD-10-CM | POA: Diagnosis not present

## 2023-12-30 DIAGNOSIS — Z794 Long term (current) use of insulin: Secondary | ICD-10-CM | POA: Diagnosis not present

## 2023-12-30 DIAGNOSIS — E1365 Other specified diabetes mellitus with hyperglycemia: Secondary | ICD-10-CM | POA: Diagnosis not present

## 2023-12-30 DIAGNOSIS — E0865 Diabetes mellitus due to underlying condition with hyperglycemia: Secondary | ICD-10-CM

## 2023-12-30 DIAGNOSIS — E1165 Type 2 diabetes mellitus with hyperglycemia: Secondary | ICD-10-CM | POA: Diagnosis not present

## 2023-12-30 LAB — POCT GLYCOSYLATED HEMOGLOBIN (HGB A1C): Hemoglobin A1C: 6.3 % — AB (ref 4.0–5.6)

## 2023-12-30 LAB — MICROALBUMIN / CREATININE URINE RATIO
Creatinine, Urine: 156 mg/dL (ref 20–275)
Microalb Creat Ratio: 87 mg/g{creat} — ABNORMAL HIGH (ref <30)
Microalb, Ur: 13.5 mg/dL

## 2023-12-30 MED ORDER — DEXCOM G7 SENSOR MISC: 3 refills | Status: AC

## 2023-12-30 MED ORDER — GLUCAGON 3 MG/DOSE NA POWD: 3.0000 mg | Freq: Once | NASAL | 11 refills | Status: AC | PRN

## 2023-12-30 NOTE — Patient Instructions (Addendum)
 Please use the following regimen: - Metformin  ER 500 mg 2x a day - Rybelsus  7 mg before b'fast - Lantus  8 units in a.m. - NovoLog  3-6 units 30 min before meals Also, use the following sliding scale for NovoLog : - 130-180: + 1 unit  - 181-230: + 2 units  - 231-280: + 3 units  - >281: + 4 units    Please return in 4-6 months.

## 2023-12-31 ENCOUNTER — Encounter: Payer: Self-pay | Admitting: Internal Medicine

## 2024-01-05 DIAGNOSIS — Z1231 Encounter for screening mammogram for malignant neoplasm of breast: Secondary | ICD-10-CM | POA: Diagnosis not present

## 2024-01-05 LAB — HM MAMMOGRAPHY

## 2024-01-12 DIAGNOSIS — M9902 Segmental and somatic dysfunction of thoracic region: Secondary | ICD-10-CM | POA: Diagnosis not present

## 2024-01-12 DIAGNOSIS — M47816 Spondylosis without myelopathy or radiculopathy, lumbar region: Secondary | ICD-10-CM | POA: Diagnosis not present

## 2024-01-12 DIAGNOSIS — S138XXA Sprain of joints and ligaments of other parts of neck, initial encounter: Secondary | ICD-10-CM | POA: Diagnosis not present

## 2024-01-12 DIAGNOSIS — M9905 Segmental and somatic dysfunction of pelvic region: Secondary | ICD-10-CM | POA: Diagnosis not present

## 2024-01-12 DIAGNOSIS — S29012A Strain of muscle and tendon of back wall of thorax, initial encounter: Secondary | ICD-10-CM | POA: Diagnosis not present

## 2024-01-12 DIAGNOSIS — M9903 Segmental and somatic dysfunction of lumbar region: Secondary | ICD-10-CM | POA: Diagnosis not present

## 2024-01-12 DIAGNOSIS — M9901 Segmental and somatic dysfunction of cervical region: Secondary | ICD-10-CM | POA: Diagnosis not present

## 2024-01-18 ENCOUNTER — Encounter: Payer: Self-pay | Admitting: Family Medicine

## 2024-01-18 ENCOUNTER — Ambulatory Visit (INDEPENDENT_AMBULATORY_CARE_PROVIDER_SITE_OTHER): Payer: Medicare PPO | Admitting: Family Medicine

## 2024-01-18 VITALS — BP 130/80 | HR 71 | Temp 98.0°F | Ht 69.0 in | Wt 149.1 lb

## 2024-01-18 DIAGNOSIS — E7800 Pure hypercholesterolemia, unspecified: Secondary | ICD-10-CM

## 2024-01-18 DIAGNOSIS — I10 Essential (primary) hypertension: Secondary | ICD-10-CM | POA: Diagnosis not present

## 2024-01-18 LAB — LIPID PANEL
Cholesterol: 149 mg/dL (ref 0–200)
HDL: 57.7 mg/dL (ref >39.00)
LDL Cholesterol: 75 mg/dL (ref 0–99)
NonHDL: 91.13
Total CHOL/HDL Ratio: 3
Triglycerides: 80 mg/dL (ref 0.0–149.0)
VLDL: 16 mg/dL (ref 0.0–40.0)

## 2024-01-18 LAB — CBC WITH DIFFERENTIAL/PLATELET
Basophils Absolute: 0.1 10*3/uL (ref 0.0–0.1)
Basophils Relative: 1.2 % (ref 0.0–3.0)
Eosinophils Absolute: 0.6 10*3/uL (ref 0.0–0.7)
Eosinophils Relative: 8.8 % — ABNORMAL HIGH (ref 0.0–5.0)
HCT: 38.5 % (ref 36.0–46.0)
Hemoglobin: 12.6 g/dL (ref 12.0–15.0)
Lymphocytes Relative: 36 % (ref 12.0–46.0)
Lymphs Abs: 2.4 10*3/uL (ref 0.7–4.0)
MCHC: 32.7 g/dL (ref 30.0–36.0)
MCV: 90.2 fl (ref 78.0–100.0)
Monocytes Absolute: 0.7 10*3/uL (ref 0.1–1.0)
Monocytes Relative: 11 % (ref 3.0–12.0)
Neutro Abs: 2.9 10*3/uL (ref 1.4–7.7)
Neutrophils Relative %: 43 % (ref 43.0–77.0)
Platelets: 346 10*3/uL (ref 150.0–400.0)
RBC: 4.27 Mil/uL (ref 3.87–5.11)
RDW: 13.2 % (ref 11.5–15.5)
WBC: 6.7 10*3/uL (ref 4.0–10.5)

## 2024-01-18 LAB — HEPATIC FUNCTION PANEL
ALT: 22 U/L (ref 0–35)
AST: 23 U/L (ref 0–37)
Albumin: 4.2 g/dL (ref 3.5–5.2)
Alkaline Phosphatase: 52 U/L (ref 39–117)
Bilirubin, Direct: 0 mg/dL (ref 0.0–0.3)
Total Bilirubin: 0.4 mg/dL (ref 0.2–1.2)
Total Protein: 7.2 g/dL (ref 6.0–8.3)

## 2024-01-18 LAB — BASIC METABOLIC PANEL WITH GFR
BUN: 24 mg/dL — ABNORMAL HIGH (ref 6–23)
CO2: 33 meq/L — ABNORMAL HIGH (ref 19–32)
Calcium: 10 mg/dL (ref 8.4–10.5)
Chloride: 99 meq/L (ref 96–112)
Creatinine, Ser: 0.82 mg/dL (ref 0.40–1.20)
GFR: 75 mL/min (ref >60.00)
Glucose, Bld: 147 mg/dL — ABNORMAL HIGH (ref 70–99)
Potassium: 4.7 meq/L (ref 3.5–5.1)
Sodium: 139 meq/L (ref 135–145)

## 2024-01-18 LAB — TSH: TSH: 1.98 u[IU]/mL (ref 0.35–5.50)

## 2024-01-18 NOTE — Progress Notes (Signed)
   Subjective:    Patient ID: Amber Washington, female    DOB: 02-Feb-1956, 68 y.o.   MRN: [id number]  HPI Hyperlipidemia- chronic problem, on Lipitor 10mg  daily w/o difficulty.  No abd pain, N/V.  HTN- chronic problem, on Losartan  50mg  daily w/ good control.  Pt reports feeling good.  No CP, SOB, HA's, visual changes, edema.   Review of Systems For ROS see HPI     Objective:   Physical Exam Vitals reviewed.  Constitutional:      General: She is not in acute distress.    Appearance: Normal appearance. She is well-developed. She is not ill-appearing.  HENT:     Head: Normocephalic and atraumatic.  Eyes:     Conjunctiva/sclera: Conjunctivae normal.     Pupils: Pupils are equal, round, and reactive to light.  Neck:     Thyroid : No thyromegaly.  Cardiovascular:     Rate and Rhythm: Normal rate and regular rhythm.     Pulses: Normal pulses.     Heart sounds: Normal heart sounds. No murmur heard. Pulmonary:     Effort: Pulmonary effort is normal. No respiratory distress.     Breath sounds: Normal breath sounds.  Abdominal:     General: There is no distension.     Palpations: Abdomen is soft.     Tenderness: There is no abdominal tenderness.  Musculoskeletal:     Cervical back: Normal range of motion and neck supple.     Right lower leg: No edema.     Left lower leg: No edema.  Lymphadenopathy:     Cervical: No cervical adenopathy.  Skin:    General: Skin is warm and dry.  Neurological:     General: No focal deficit present.     Mental Status: She is alert and oriented to person, place, and time.  Psychiatric:        Mood and Affect: Mood normal.        Behavior: Behavior normal.        Thought Content: Thought content normal.           Assessment & Plan:

## 2024-01-18 NOTE — Assessment & Plan Note (Addendum)
 Chronic problem.  On Losartan  50mg  daily w/ good control.  Currently asymptomatic.  Check labs due to ARB use but no anticipated med changes.  Will follow.

## 2024-01-18 NOTE — Patient Instructions (Signed)
 Schedule your complete physical in 6 months We'll notify you of your lab results and make any changes if needed Keep up the good work on healthy diet and regular exercise- you look great! Call with any questions or concerns Stay Safe!  Stay Healthy! Happy [place] Mother[place] Day!!!

## 2024-01-18 NOTE — Assessment & Plan Note (Signed)
 Chronic problem.  On Lipitor 10mg  daily w/o difficulty.  Check labs.  Adjust meds prn

## 2024-01-19 ENCOUNTER — Ambulatory Visit: Payer: Self-pay | Admitting: Family Medicine

## 2024-01-19 NOTE — Telephone Encounter (Signed)
-----   Message from Laymon Priest sent at 01/19/2024  8:51 AM EDT ----- Labs look great!  No changes at this time

## 2024-01-19 NOTE — Telephone Encounter (Signed)
 Pt has reviewed labs via MyChart

## 2024-01-27 ENCOUNTER — Encounter: Payer: Self-pay | Admitting: Family Medicine

## 2024-02-09 DIAGNOSIS — M9905 Segmental and somatic dysfunction of pelvic region: Secondary | ICD-10-CM | POA: Diagnosis not present

## 2024-02-09 DIAGNOSIS — M9903 Segmental and somatic dysfunction of lumbar region: Secondary | ICD-10-CM | POA: Diagnosis not present

## 2024-02-09 DIAGNOSIS — M9901 Segmental and somatic dysfunction of cervical region: Secondary | ICD-10-CM | POA: Diagnosis not present

## 2024-02-09 DIAGNOSIS — M9902 Segmental and somatic dysfunction of thoracic region: Secondary | ICD-10-CM | POA: Diagnosis not present

## 2024-02-09 DIAGNOSIS — M47816 Spondylosis without myelopathy or radiculopathy, lumbar region: Secondary | ICD-10-CM | POA: Diagnosis not present

## 2024-02-09 DIAGNOSIS — S29012A Strain of muscle and tendon of back wall of thorax, initial encounter: Secondary | ICD-10-CM | POA: Diagnosis not present

## 2024-02-09 DIAGNOSIS — S138XXA Sprain of joints and ligaments of other parts of neck, initial encounter: Secondary | ICD-10-CM | POA: Diagnosis not present

## 2024-03-04 ENCOUNTER — Encounter: Payer: Self-pay | Admitting: Family Medicine

## 2024-03-04 MED ORDER — ALPRAZOLAM 0.5 MG PO TABS: ORAL_TABLET | ORAL | 0 refills | Status: DC

## 2024-03-04 NOTE — Telephone Encounter (Signed)
 Requested Prescriptions   Pending Prescriptions Disp Refills   ALPRAZolam  (XANAX ) 0.5 MG tablet 90 tablet 0    Sig: TAKE ONE TABLET BY MOUTH THREE TIMES DAILY AS NEEDED FOR SLEEP OR FOR ANXIETY     Date of patient request: 02/23/2024 Last office visit: 01/18/2024 Upcoming visit: 07/25/2024 Date of last refill: 09/14/2023 Last refill amount: 90

## 2024-03-08 DIAGNOSIS — M47816 Spondylosis without myelopathy or radiculopathy, lumbar region: Secondary | ICD-10-CM | POA: Diagnosis not present

## 2024-03-08 DIAGNOSIS — S138XXA Sprain of joints and ligaments of other parts of neck, initial encounter: Secondary | ICD-10-CM | POA: Diagnosis not present

## 2024-03-08 DIAGNOSIS — M9901 Segmental and somatic dysfunction of cervical region: Secondary | ICD-10-CM | POA: Diagnosis not present

## 2024-03-08 DIAGNOSIS — S29012A Strain of muscle and tendon of back wall of thorax, initial encounter: Secondary | ICD-10-CM | POA: Diagnosis not present

## 2024-03-08 DIAGNOSIS — M9902 Segmental and somatic dysfunction of thoracic region: Secondary | ICD-10-CM | POA: Diagnosis not present

## 2024-03-08 DIAGNOSIS — M9905 Segmental and somatic dysfunction of pelvic region: Secondary | ICD-10-CM | POA: Diagnosis not present

## 2024-03-08 DIAGNOSIS — M9903 Segmental and somatic dysfunction of lumbar region: Secondary | ICD-10-CM | POA: Diagnosis not present

## 2024-04-04 ENCOUNTER — Encounter: Payer: Self-pay | Admitting: Internal Medicine

## 2024-04-05 ENCOUNTER — Other Ambulatory Visit (HOSPITAL_COMMUNITY): Payer: Self-pay

## 2024-04-05 ENCOUNTER — Telehealth: Payer: Self-pay

## 2024-04-05 DIAGNOSIS — M9903 Segmental and somatic dysfunction of lumbar region: Secondary | ICD-10-CM | POA: Diagnosis not present

## 2024-04-05 DIAGNOSIS — M47816 Spondylosis without myelopathy or radiculopathy, lumbar region: Secondary | ICD-10-CM | POA: Diagnosis not present

## 2024-04-05 DIAGNOSIS — M9905 Segmental and somatic dysfunction of pelvic region: Secondary | ICD-10-CM | POA: Diagnosis not present

## 2024-04-05 DIAGNOSIS — M9901 Segmental and somatic dysfunction of cervical region: Secondary | ICD-10-CM | POA: Diagnosis not present

## 2024-04-05 DIAGNOSIS — S138XXA Sprain of joints and ligaments of other parts of neck, initial encounter: Secondary | ICD-10-CM | POA: Diagnosis not present

## 2024-04-05 DIAGNOSIS — M9902 Segmental and somatic dysfunction of thoracic region: Secondary | ICD-10-CM | POA: Diagnosis not present

## 2024-04-05 DIAGNOSIS — S29012A Strain of muscle and tendon of back wall of thorax, initial encounter: Secondary | ICD-10-CM | POA: Diagnosis not present

## 2024-04-05 NOTE — Telephone Encounter (Signed)
 Pharmacy Patient Advocate Encounter   Received notification from Patient Advice Request messages that prior authorization for Dexcom G7 sensor is required/requested.   Insurance verification completed.   The patient is insured through [facility] .   Per test claim: PA required; PA submitted to above mentioned insurance via CoverMyMeds Key/confirmation #/EOC [facility] Status is pending

## 2024-04-11 NOTE — Telephone Encounter (Signed)
 Pharmacy Patient Advocate Encounter  Received notification from [employer] that Prior Authorization for Dexcom G7 sensor has been APPROVED from 04/05/24 to 09/07/24   PA #/Case ID/Reference #: [id number]

## 2024-05-03 DIAGNOSIS — S29012A Strain of muscle and tendon of back wall of thorax, initial encounter: Secondary | ICD-10-CM | POA: Diagnosis not present

## 2024-05-03 DIAGNOSIS — M9902 Segmental and somatic dysfunction of thoracic region: Secondary | ICD-10-CM | POA: Diagnosis not present

## 2024-05-03 DIAGNOSIS — M47816 Spondylosis without myelopathy or radiculopathy, lumbar region: Secondary | ICD-10-CM | POA: Diagnosis not present

## 2024-05-03 DIAGNOSIS — M9901 Segmental and somatic dysfunction of cervical region: Secondary | ICD-10-CM | POA: Diagnosis not present

## 2024-05-03 DIAGNOSIS — M9903 Segmental and somatic dysfunction of lumbar region: Secondary | ICD-10-CM | POA: Diagnosis not present

## 2024-05-03 DIAGNOSIS — S138XXA Sprain of joints and ligaments of other parts of neck, initial encounter: Secondary | ICD-10-CM | POA: Diagnosis not present

## 2024-05-03 DIAGNOSIS — M9905 Segmental and somatic dysfunction of pelvic region: Secondary | ICD-10-CM | POA: Diagnosis not present

## 2024-05-18 ENCOUNTER — Other Ambulatory Visit: Payer: Self-pay | Admitting: Family Medicine

## 2024-05-24 ENCOUNTER — Other Ambulatory Visit: Payer: Self-pay | Admitting: Student in an Organized Health Care Education/Training Program

## 2024-05-24 NOTE — Telephone Encounter (Signed)
 Requested Prescriptions   Pending Prescriptions Disp Refills   ALPRAZolam  (XANAX ) 0.5 MG tablet [Pharmacy Med Name: ALPRAZOLAM  0.5 MG TABS 0.5 Tablet] 90 tablet 0    Sig: TAKE 1 TABLET BY MOUTH 3 TIMES DAILY AS NEEDED FOR SLEEP OR FOR ANXIETY     Date of patient request: 05/24/2024 Last office visit: Visit date not found Upcoming visit: Visit date not found Date of last refill: 03/04/2024 Last refill amount: 90

## 2024-05-31 DIAGNOSIS — M9902 Segmental and somatic dysfunction of thoracic region: Secondary | ICD-10-CM | POA: Diagnosis not present

## 2024-05-31 DIAGNOSIS — M9903 Segmental and somatic dysfunction of lumbar region: Secondary | ICD-10-CM | POA: Diagnosis not present

## 2024-05-31 DIAGNOSIS — M9905 Segmental and somatic dysfunction of pelvic region: Secondary | ICD-10-CM | POA: Diagnosis not present

## 2024-05-31 DIAGNOSIS — M9901 Segmental and somatic dysfunction of cervical region: Secondary | ICD-10-CM | POA: Diagnosis not present

## 2024-05-31 DIAGNOSIS — S29012A Strain of muscle and tendon of back wall of thorax, initial encounter: Secondary | ICD-10-CM | POA: Diagnosis not present

## 2024-05-31 DIAGNOSIS — M47816 Spondylosis without myelopathy or radiculopathy, lumbar region: Secondary | ICD-10-CM | POA: Diagnosis not present

## 2024-05-31 DIAGNOSIS — S138XXA Sprain of joints and ligaments of other parts of neck, initial encounter: Secondary | ICD-10-CM | POA: Diagnosis not present

## 2024-06-24 ENCOUNTER — Encounter: Payer: Self-pay | Admitting: Internal Medicine

## 2024-06-24 ENCOUNTER — Ambulatory Visit (INDEPENDENT_AMBULATORY_CARE_PROVIDER_SITE_OTHER): Admitting: Internal Medicine

## 2024-06-24 ENCOUNTER — Other Ambulatory Visit

## 2024-06-24 VITALS — BP 120/80 | HR 86 | Ht 69.0 in | Wt 150.0 lb

## 2024-06-24 DIAGNOSIS — Z90411 Acquired partial absence of pancreas: Secondary | ICD-10-CM | POA: Diagnosis not present

## 2024-06-24 DIAGNOSIS — Z794 Long term (current) use of insulin: Secondary | ICD-10-CM

## 2024-06-24 DIAGNOSIS — E7800 Pure hypercholesterolemia, unspecified: Secondary | ICD-10-CM | POA: Diagnosis not present

## 2024-06-24 DIAGNOSIS — E1165 Type 2 diabetes mellitus with hyperglycemia: Secondary | ICD-10-CM

## 2024-06-24 NOTE — Patient Instructions (Addendum)
 Please use the following regimen: - Metformin  ER 1000 mg in am - Rybelsus  7 mg before b'fast - Lantus  8 units in a.m. - NovoLog  4-6 units 15-30 min before meals Also, use the following sliding scale for NovoLog : - 130-180: + 1 unit  - 181-230: + 2 units  - 231-280: + 3 units  - >281: + 4 units    Let me know if FiAsp  or Lyumjev are covered.  Please return in 4-6 months.

## 2024-06-24 NOTE — Progress Notes (Signed)
 Patient ID: Amber Washington, female   DOB: May 11, 1956, 68 y.o.   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  Last visit 6 months ago.  Interim history: No increased urination, blurry vision, nausea, chest pain.    Reviewed HbA1c levels: Lab Results  Component Value Date   HGBA1C 6.3 (A) 12/30/2023   HGBA1C 6.5 07/21/2023   HGBA1C 6.4 03/24/2023  03/24/2023: HbA1c 6.4%  She is on: - Rybelsus  3.5 mg before breakfast >> moved before Dinner >> 7 mg before breakfast - Metformin  ER 500 >> 1000 mg twice a day with meals (diarrhea) >> .SABRASABRA 1000 mg with dinner >> 500 mg 2x a day (b/c loose stools) >> 1000 mg in am - Basaglar  5 units daily at bedtime >> ...8 >> 10 >> Lantus  8 units in a.m. - Novolog   6-8 >> .SABRASABRA2-5 >> 3-5 >> 3-6 (still using 4-5) units before lunch and dinner + Sliding scale: - 130-180: + 1 unit  - 181-230: + 2 units  - 231-280: + 3 units  - >281: + 4 units  She was on Jardiance  25 mg before b'fast >> yeast inf's >> stopped.  She did not have problems with the lower dose. She was previously on Januvia , before changing to Rybelsus .  Pt checks sugars >4 times a day with her CGM - Dexcom G7 -through [facility]:  Previously:  Previously:   Lowest: 58 >> 60s. Highest: 300 >> ... 200s.  Glucometer: [employer]  Pt's meals are: - Breakfast: oatmeal + yoghurt + crackers - Lunch: salads + meat/cheese/crackers + fruit - Dinner: chicken + veggies or salad - Snacks: nuts , crackers, carrots - 1-x a day She only drinks water. She is walking for exercise.  No CKD, last BUN/creatinine:  Lab Results  Component Value Date   BUN 24 (H) 01/18/2024   BUN 25 (H) 07/21/2023   CREATININE 0.82 01/18/2024   CREATININE 0.81 07/21/2023   Normal ACR: Lab Results  Component Value Date    MICRALBCREAT 87 (H) 12/30/2023   MICRALBCREAT 3.2 02/05/2010  On losartan  50ty.  + HL; last set of lipids: Lab Results  Component Value Date   CHOL 149 01/18/2024   HDL 57.70 01/18/2024   LDLCALC 75 01/18/2024   TRIG 80.0 01/18/2024   CHOLHDL 3 01/18/2024  On Lipitor 10.  - last eye exam was: 09/30/2023 - No DR.  -she denies numbness and tingling in her feet.  Last foot exam 12/30/2023.  She has a history of HTN, hematuria, chronic insomnia-Xanax  helping.  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  ROS: + see HPI  I reviewed pt's medications, allergies, PMH, social hx, family hx, and changes were documented in the history of present illness. Otherwise, unchanged from my initial visit note.  Past Medical History:  Diagnosis Date   Allergic rhinitis    Anxiety state, unspecified    Asthma    Benign neoplasm of pancreas, except islets of Langerhans    Borderline hypertension    Hypercholesteremia    Malignant neoplasm of breast (female), unspecified site    Osteoporosis, unspecified    Personal history of chemotherapy    Personal history of radiation therapy    Stenosis of nasolacrimal duct, acquired    Type II or unspecified  type diabetes mellitus without mention of complication, not stated as uncontrolled    Past Surgical History:  Procedure Laterality Date   BREAST BIOPSY     BREAST EXCISIONAL BIOPSY     BREAST LUMPECTOMY     [id number] 1995   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   NASAL SEPTUM SURGERY     right breast cancer sugery  1995   surgery on neck for removal of birthmark     tur induction     Social History   Social History   Marital status: Married    Spouse name: N/A   Number of children: 2   Occupational History   Catering manager   Social History Main Topics   Smoking status: Never Smoker   Smokeless tobacco: Never Used   Alcohol use No   Drug use:  No   Current Outpatient Medications on File Prior to Visit  Medication Sig Dispense Refill   albuterol  (VENTOLIN  HFA) 108 (90 Base) MCG/ACT inhaler Inhale 2 puffs into the lungs every 6 (six) hours as needed for wheezing or shortness of breath. 8 g 3   alendronate  (FOSAMAX ) 70 MG tablet Take 1 tablet (70 mg total) by mouth every 7 (seven) days. Take with a full glass of water on an empty stomach. 12 tablet 3   ALPRAZolam  (XANAX ) 0.5 MG tablet TAKE 1 TABLET BY MOUTH 3 TIMES DAILY AS NEEDED FOR SLEEP OR FOR ANXIETY 90 tablet 0   aspirin 81 MG tablet Take 81 mg by mouth daily.     atorvastatin  (LIPITOR) 10 MG tablet TAKE 1 TABLET DAILY *MYLAN* 90 tablet 1   BD PEN NEEDLE NANO U/F 32G X 4 MM MISC USE 2 TO 3 TIMES A DAY 200 each 4   Blood Glucose Monitoring Suppl (ACCU-CHEK GUIDE) w/Device KIT Use as directed 1 kit 0   budesonide -formoterol  (SYMBICORT ) 80-4.5 MCG/ACT inhaler Inhale 2 puffs into the lungs 2 (two) times daily. 1 each 3   Cholecalciferol (VITAMIN D ) 2000 UNITS CAPS Take 1 capsule by mouth daily.     Continuous Glucose Sensor (DEXCOM G7 SENSOR) MISC Use as instructed to check blood sugar. Change every 10 days 9 each 3   Glucagon  3 MG/DOSE POWD Place 3 mg into the nose once as needed for up to 1 dose. 1 each 11   glucose blood (ACCU-CHEK GUIDE TEST) test strip Use to check blood sugar 3-4 times a day. (Patient not taking: Reported on 01/18/2024) 400 each 12   insulin  aspart (NOVOLOG  FLEXPEN) 100 UNIT/ML FlexPen Inject under skin 3-5 units before dinner and lunch 15 mL 3   insulin  glargine (LANTUS  SOLOSTAR) 100 UNIT/ML Solostar Pen Inject 8-10 Units into the skin daily. 15 mL 3   losartan  (COZAAR ) 50 MG tablet Take 1 tablet (50 mg total) by mouth daily. 180 tablet 1   metFORMIN  (GLUCOPHAGE -XR) 500 MG 24 hr tablet Take 2 tablets (1,000 mg total) by mouth daily with supper. 180 tablet 3   montelukast  (SINGULAIR ) 10 MG tablet Take 1 tablet (10 mg total) by mouth at bedtime. 180 tablet 1    Semaglutide  (RYBELSUS ) 7 MG TABS Take 1 tablet (7 mg total) by mouth daily. 90 tablet 3   traZODone  (DESYREL ) 50 MG tablet Take 0.5-1 tablets (25-50 mg total) by mouth at bedtime as needed for sleep. 30 tablet 3   No current facility-administered medications on file prior to visit.   Allergies  Allergen Reactions   Influenza Vaccines Other (See  Comments)    Unknown, allergic to egg whites  Other reaction(s): Other (See Comments)  Unknown, allergic to egg whites   Bactrim  [Sulfamethoxazole -Trimethoprim ] Nausea And Vomiting and Other (See Comments)    Shaking chills   Egg Protein-Containing Drug Products    FH: - mother - leukemia, father - liver cancer - mother - HTN - DM in nephew.  PE: There were no vitals taken for this visit.  Wt Readings from Last 3 Encounters:  01/18/24 149 lb 2 oz (67.6 kg)  12/30/23 148 lb 6.4 oz (67.3 kg)  09/14/23 146 lb 2 oz (66.3 kg)   Constitutional: normal weight, in NAD Eyes: EOMI, no exophthalmos, + arcus cornealis ENT: no thyromegaly, no cervical lymphadenopathy Cardiovascular: RRR, No MRG Respiratory: CTA B Musculoskeletal: no deformities Skin:  no rashes Neurological: no tremor with outstretched hands  ASSESSMENT: 1. DM, insulin -dependent, previously uncontrolled, without long term complications, but with hyperglycemia  Component     Latest Ref Rng & Units 12/04/2016  C-Peptide     0.80 - 3.85 ng/mL 0.94  Glucose, Fasting     65 - 99 mg/dL 839 (H)  C-peptide is not very high, but is still in the normal range.   She has a history of distal pancreatic resection (?%) For a benign cyst and she developed diabetes soon after the surgery.  2. HL  PLAN:  1. Patient with history of diabetes due to distal pancreatectomy for pancreatic cyst.  She is on a basal large bolus insulin  regimen along with metformin  and p.o. GLP-1 receptor agonist.  Last visit, HbA1c improved to 6.3%.  Sugars appears to be mostly at goal, excellent overnight and  increasing slightly after meals, but not consistently.  She had higher hyperglycemic peaks after Easter lunch despite taking 5 units of NovoLog  and we discussed that for larger meals, she may need to add 1 more unit.  Otherwise I did not suggest any changes in her regimen. - She had problems obtaining her CGM through [facility] (higher price), so the prescription needs to go through [place]. CGM interpretation: -At today's visit, we reviewed her CGM downloads: It appears that 89% of values are in target range (goal >70%), while 11% are higher than 180 (goal <25%), and 0% are lower than 70 (goal <4%).  The calculated average blood sugar is 133.  The projected HbA1c for the next 3 months (GMI) is 6.5%. -Reviewing the CGM trends, sugars appear to fluctuate mainly within the target range but with increasing blood sugars particularly after breakfast and more so after dinner.  Upon questioning, she is increasing the doses of insulin  based on the size of her meal but not up to 6 units, as advised at last visit.  We discussed about trying this but also to vary the lag time between injecting NovoLog  and the start of the meal.  I advised her to experiment with longer lag times up to  30 minutes. Otherwise, I do not feel we need to change her regimen.  I did advise her to look up whether Fiasp  or Lyumjev are covered for her.  She recently changed metformin  to taking both tablets in the morning and we will continue this to further improve her blood sugars during the day. - I suggested to:  Patient Instructions  Please use the following regimen: - Metformin  ER 1000 mg in am - Rybelsus  7 mg before b'fast - Lantus  8 units in a.m. - NovoLog  4-6 units 15-30 min before meals Also, use the following  sliding scale for NovoLog : - 130-180: + 1 unit  - 181-230: + 2 units  - 231-280: + 3 units  - >281: + 4 units    Let me know if FiAsp  or Lyumjev are covered.  Please return in 4-6 months.  - we checked her HbA1c: 6.2%  (lower) - advised to check sugars at different times of the day - 4x a day, rotating check times - advised for yearly eye exams >> she is UTD - her ACR was slightly high at last visit, at 87. Will repeat this today. - return to clinic in 4-6 months  2. HL - She has arcus Cornelius - Lipid panel from 01/2024 shows fractions at goal: Lab Results  Component Value Date   CHOL 149 01/18/2024   HDL 57.70 01/18/2024   LDLCALC 75 01/18/2024   TRIG 80.0 01/18/2024   CHOLHDL 3 01/18/2024  - She is on Lipitor 10 mg daily without side effects  Orders Placed This Encounter  Procedures   Microalbumin / creatinine urine ratio   Lela Fendt, MD PhD [facility] Endocrinology

## 2024-06-25 LAB — MICROALBUMIN / CREATININE URINE RATIO
Creatinine, Urine: 193 mg/dL (ref 20–275)
Microalb Creat Ratio: 94 mg/g{creat} — ABNORMAL HIGH (ref <30)
Microalb, Ur: 18.2 mg/dL

## 2024-06-27 ENCOUNTER — Ambulatory Visit: Payer: Self-pay | Admitting: Internal Medicine

## 2024-06-27 MED ORDER — EMPAGLIFLOZIN 10 MG PO TABS: 10.0000 mg | ORAL_TABLET | Freq: Every day | ORAL | Status: DC

## 2024-06-27 NOTE — Addendum Note (Signed)
 Addended by: TRIXIE FILE on: 06/27/2024 11:49 AM   Modules accepted: Orders

## 2024-06-30 ENCOUNTER — Other Ambulatory Visit: Payer: Self-pay | Admitting: Internal Medicine

## 2024-06-30 ENCOUNTER — Ambulatory Visit: Admitting: Internal Medicine

## 2024-06-30 DIAGNOSIS — E1165 Type 2 diabetes mellitus with hyperglycemia: Secondary | ICD-10-CM

## 2024-06-30 MED ORDER — EMPAGLIFLOZIN 10 MG PO TABS: 10.0000 mg | ORAL_TABLET | Freq: Every day | ORAL | 11 refills | Status: AC

## 2024-07-01 DIAGNOSIS — M47816 Spondylosis without myelopathy or radiculopathy, lumbar region: Secondary | ICD-10-CM | POA: Diagnosis not present

## 2024-07-01 DIAGNOSIS — M9902 Segmental and somatic dysfunction of thoracic region: Secondary | ICD-10-CM | POA: Diagnosis not present

## 2024-07-01 DIAGNOSIS — M9903 Segmental and somatic dysfunction of lumbar region: Secondary | ICD-10-CM | POA: Diagnosis not present

## 2024-07-01 DIAGNOSIS — M9901 Segmental and somatic dysfunction of cervical region: Secondary | ICD-10-CM | POA: Diagnosis not present

## 2024-07-01 DIAGNOSIS — S29012A Strain of muscle and tendon of back wall of thorax, initial encounter: Secondary | ICD-10-CM | POA: Diagnosis not present

## 2024-07-01 DIAGNOSIS — M9905 Segmental and somatic dysfunction of pelvic region: Secondary | ICD-10-CM | POA: Diagnosis not present

## 2024-07-01 DIAGNOSIS — S138XXA Sprain of joints and ligaments of other parts of neck, initial encounter: Secondary | ICD-10-CM | POA: Diagnosis not present

## 2024-07-07 ENCOUNTER — Other Ambulatory Visit: Payer: Self-pay | Admitting: Family Medicine

## 2024-07-07 DIAGNOSIS — J309 Allergic rhinitis, unspecified: Secondary | ICD-10-CM

## 2024-07-07 DIAGNOSIS — I10 Essential (primary) hypertension: Secondary | ICD-10-CM

## 2024-07-08 ENCOUNTER — Other Ambulatory Visit: Payer: Self-pay

## 2024-07-08 MED ORDER — ALPRAZOLAM 0.5 MG PO TABS: ORAL_TABLET | ORAL | 0 refills | Status: AC

## 2024-07-08 NOTE — Telephone Encounter (Signed)
 Requested Prescriptions   Pending Prescriptions Disp Refills   ALPRAZolam  (XANAX ) 0.5 MG tablet 90 tablet 0    Sig: TAKE 1 TABLET BY MOUTH 3 TIMES DAILY AS NEEDED FOR SLEEP OR FOR ANXIETY     Date of patient request: 07/08/24 Last office visit: Visit date not found Upcoming visit: 07/25/24 Date of last refill: 05/24/24 Last refill amount: 90

## 2024-07-25 ENCOUNTER — Ambulatory Visit: Admitting: Family Medicine

## 2024-07-25 ENCOUNTER — Encounter: Payer: Self-pay | Admitting: Family Medicine

## 2024-07-25 ENCOUNTER — Encounter: Payer: Self-pay | Admitting: Pharmacist

## 2024-07-25 VITALS — BP 138/82 | HR 89 | Temp 97.8°F | Ht 68.0 in | Wt 150.8 lb

## 2024-07-25 DIAGNOSIS — Z794 Long term (current) use of insulin: Secondary | ICD-10-CM | POA: Diagnosis not present

## 2024-07-25 DIAGNOSIS — E1165 Type 2 diabetes mellitus with hyperglycemia: Secondary | ICD-10-CM | POA: Diagnosis not present

## 2024-07-25 DIAGNOSIS — Z23 Encounter for immunization: Secondary | ICD-10-CM

## 2024-07-25 DIAGNOSIS — M8580 Other specified disorders of bone density and structure, unspecified site: Secondary | ICD-10-CM

## 2024-07-25 DIAGNOSIS — Z0000 Encounter for general adult medical examination without abnormal findings: Secondary | ICD-10-CM

## 2024-07-25 LAB — CBC WITH DIFFERENTIAL/PLATELET
Basophils Absolute: 0.1 K/uL (ref 0.0–0.1)
Basophils Relative: 1 % (ref 0.0–3.0)
Eosinophils Absolute: 0.4 K/uL (ref 0.0–0.7)
Eosinophils Relative: 5.2 % — ABNORMAL HIGH (ref 0.0–5.0)
HCT: 38.3 % (ref 36.0–46.0)
Hemoglobin: 12.9 g/dL (ref 12.0–15.0)
Lymphocytes Relative: 32.2 % (ref 12.0–46.0)
Lymphs Abs: 2.4 K/uL (ref 0.7–4.0)
MCHC: 33.7 g/dL (ref 30.0–36.0)
MCV: 89 fl (ref 78.0–100.0)
Monocytes Absolute: 0.6 K/uL (ref 0.1–1.0)
Monocytes Relative: 8.1 % (ref 3.0–12.0)
Neutro Abs: 4 K/uL (ref 1.4–7.7)
Neutrophils Relative %: 53.5 % (ref 43.0–77.0)
Platelets: 332 K/uL (ref 150.0–400.0)
RBC: 4.31 Mil/uL (ref 3.87–5.11)
RDW: 14.2 % (ref 11.5–15.5)
WBC: 7.4 K/uL (ref 4.0–10.5)

## 2024-07-25 LAB — TSH: TSH: 1.87 u[IU]/mL (ref 0.35–5.50)

## 2024-07-25 LAB — VITAMIN D 25 HYDROXY (VIT D DEFICIENCY, FRACTURES): VITD: 65.59 ng/mL (ref 30.00–100.00)

## 2024-07-25 NOTE — Patient Instructions (Signed)
 Follow up in 6 months to recheck cholesterol, blood pressure We'll notify you of your lab results and make any changes if needed Keep up the good work on healthy diet and regular exercise- you look great!! Call with any questions or concerns Stay Safe!  Stay Healthy! HAPPY HOLIDAYS!!!

## 2024-07-25 NOTE — Progress Notes (Signed)
   Subjective:    Patient ID: Amber Washington, female    DOB: [birth year], 68 y.o.   MRN: [id number]  HPI CPE- UTD on eye exam, foot exam, microalbumin.  UTD on mammo, colonoscopy, Tdap, PNA  Health Maintenance  Topic Date Due  . COVID-19 Vaccine (6 - 2025-26 season) 05/09/2024  . Medicare Annual Wellness (AWV)  07/20/2024  . HEMOGLOBIN A1C  06/30/2024  . OPHTHALMOLOGY EXAM  09/29/2024  . FOOT EXAM  12/29/2024  . Mammogram  01/04/2025  . Diabetic kidney evaluation - eGFR measurement  01/17/2025  . Diabetic kidney evaluation - Urine ACR  06/24/2025  . Colonoscopy  05/17/2028  . DTaP/Tdap/Td (4 - Td or Tdap) 07/09/2032  . Pneumococcal Vaccine: 50+ Years  Completed  . DEXA SCAN  Completed  . Hepatitis C Screening  Completed  . Zoster Vaccines- Shingrix   Completed  . Meningococcal B Vaccine  Aged Out    Patient Care Team    Relationship Specialty Notifications Start End  Mahlon Comer BRAVO, MD PCP - General Family Medicine  02/16/14   Luis Purchase, MD Consulting Physician Gastroenterology  06/21/15   Key, Hargis HERO, NP Nurse Practitioner Gynecology  06/26/16   Octavia Charleston, MD Consulting Physician Ophthalmology  06/26/16   Associates, [facility]    01/18/24       Review of Systems Patient reports no vision/ hearing changes, adenopathy,fever, weight change,  persistant/recurrent hoarseness , swallowing issues, chest pain, palpitations, edema, persistant/recurrent cough, hemoptysis, dyspnea (rest/exertional/paroxysmal nocturnal), gastrointestinal bleeding (melena, rectal bleeding), abdominal pain, significant heartburn, bowel changes, GU symptoms (dysuria, hematuria, incontinence), Gyn symptoms (abnormal  bleeding, pain),  syncope, focal weakness, memory loss, numbness & tingling, skin/hair changes, abnormal bruising or bleeding, anxiety, or depression.   + peeling nails    Objective:   Physical Exam General Appearance:    Alert, cooperative, no distress, appears stated age   Head:    Normocephalic, without obvious abnormality, atraumatic  Eyes:    PERRL, conjunctiva/corneas clear, EOM's intact both eyes  Ears:    Normal TM's and external ear canals, both ears  Nose:   Nares normal, septum midline, mucosa normal, no drainage    or sinus tenderness  Throat:   Lips, mucosa, and tongue normal; teeth and gums normal  Neck:   Supple, symmetrical, trachea midline, no adenopathy;    Thyroid : no enlargement/tenderness/nodules  Back:     Symmetric, no curvature, ROM normal, no CVA tenderness  Lungs:     Clear to auscultation bilaterally, respirations unlabored  Chest Wall:    No tenderness or deformity   Heart:    Regular rate and rhythm, S1 and S2 normal, no murmur, rub   or gallop  Breast Exam:    Deferred to GYN  Abdomen:     Soft, non-tender, bowel sounds active all four quadrants,    no masses, no organomegaly  Genitalia:    Deferred to GYN  Rectal:    Extremities:   Extremities normal, atraumatic, no cyanosis or edema  Pulses:   2+ and symmetric all extremities  Skin:   Skin color, texture, turgor normal, no rashes or lesions  Lymph nodes:   Cervical, supraclavicular, and axillary nodes normal  Neurologic:   CNII-XII intact, normal strength, sensation and reflexes    throughout          Assessment & Plan:

## 2024-07-25 NOTE — Assessment & Plan Note (Signed)
 Pt's PE WNL.  UTD on mammo, colonoscopy, Tdap, PNA.  UTD on eye exam, foot exam, microalbumin.  Flu shot given today (egg free).  Check labs.  Anticipatory guidance provided.

## 2024-07-25 NOTE — Progress Notes (Signed)
 Pharmacy Quality Measure Review  This patient is appearing on the insurance-providing list for being at risk of failing the adherence measure for Statin Use in Persons with Diabetes (SUPD) medications this calendar year.   Reviewed patient's medication profile. She actually has been taking atorvastatin  10mg  daily and has filled on time in 2025. She has prescription drug coverage with a [facility] plan and also [facility]. She gets atorvastatin  filled at [facility] so they only use her [facility] plan but cost is $0 with either plan that she uses.   Spoke with patient about her 2 plans. She has questions about Jardiance  cost - $47 per month. She took it in the past and cost was $10 with copay card but her pharmacy would not allow her to use it this time. Explained that they are using her Medicare pharmacy benefits and copay cards are not able to be used with Medicare.   No further intervention needed - patient is taking statin but claims are not visible to her [facility] plan.   Madelin Ray, PharmD Clinical Pharmacist [facility]  Population Health [phone]

## 2024-07-26 LAB — HEPATIC FUNCTION PANEL
ALT: 14 U/L (ref 0–35)
AST: 19 U/L (ref 0–37)
Albumin: 4.5 g/dL (ref 3.5–5.2)
Alkaline Phosphatase: 47 U/L (ref 39–117)
Bilirubin, Direct: 0.1 mg/dL (ref 0.0–0.3)
Total Bilirubin: 0.5 mg/dL (ref 0.2–1.2)
Total Protein: 7.4 g/dL (ref 6.0–8.3)

## 2024-07-26 LAB — BASIC METABOLIC PANEL WITH GFR
BUN: 25 mg/dL — ABNORMAL HIGH (ref 6–23)
CO2: 27 meq/L (ref 19–32)
Calcium: 10 mg/dL (ref 8.4–10.5)
Chloride: 102 meq/L (ref 96–112)
Creatinine, Ser: 0.92 mg/dL (ref 0.40–1.20)
GFR: 65.09 mL/min (ref >60.00)
Glucose, Bld: 114 mg/dL — ABNORMAL HIGH (ref 70–99)
Potassium: 4.5 meq/L (ref 3.5–5.1)
Sodium: 140 meq/L (ref 135–145)

## 2024-07-26 LAB — HEMOGLOBIN A1C: Hgb A1c MFr Bld: 6.6 % — ABNORMAL HIGH (ref 4.6–6.5)

## 2024-07-26 LAB — LIPID PANEL
Cholesterol: 164 mg/dL (ref 0–200)
HDL: 65.8 mg/dL (ref >39.00)
LDL Cholesterol: 85 mg/dL (ref 0–99)
NonHDL: 98.5
Total CHOL/HDL Ratio: 2
Triglycerides: 70 mg/dL (ref 0.0–149.0)
VLDL: 14 mg/dL (ref 0.0–40.0)

## 2024-07-27 ENCOUNTER — Encounter: Payer: Self-pay | Admitting: Family Medicine

## 2024-07-27 ENCOUNTER — Ambulatory Visit: Payer: Self-pay | Admitting: Family Medicine

## 2024-08-12 ENCOUNTER — Other Ambulatory Visit: Payer: Self-pay | Admitting: Internal Medicine

## 2024-08-24 ENCOUNTER — Ambulatory Visit

## 2024-08-24 VITALS — Ht 68.5 in | Wt 150.0 lb

## 2024-08-24 DIAGNOSIS — Z0000 Encounter for general adult medical examination without abnormal findings: Secondary | ICD-10-CM | POA: Diagnosis not present

## 2024-08-24 NOTE — Patient Instructions (Addendum)
 Ms. Amber Washington,  Thank you for taking the time for your Medicare Wellness Visit. I appreciate your continued commitment to your health goals. Please review the care plan we discussed, and feel free to reach out if I can assist you further.  Please note that Annual Wellness Visits do not include a physical exam. Some assessments may be limited, especially if the visit was conducted virtually. If needed, we may recommend an in-person follow-up with your provider.  Ongoing Care Seeing your primary care provider every 3 to 6 months helps us  monitor your health and provide consistent, personalized care. Next office visit on 01/25/2025.  Aim for 30 minutes of exercise or brisk walking, 6-8 glasses of water, and 5 servings of fruits and vegetables each day.   Referrals If a referral was made during today's visit and you haven't received any updates within two weeks, please contact the referred provider directly to check on the status.  Recommended Screenings:  Health Maintenance  Topic Date Due   COVID-19 Vaccine (6 - 2025-26 season) 05/09/2024   Medicare Annual Wellness Visit  07/20/2024   Eye exam for diabetics  09/29/2024   Complete foot exam   12/29/2024   Breast Cancer Screening  01/04/2025   Hemoglobin A1C  01/22/2025   Yearly kidney health urinalysis for diabetes  06/24/2025   Yearly kidney function blood test for diabetes  07/25/2025   Colon Cancer Screening  05/17/2028   DTaP/Tdap/Td vaccine (4 - Td or Tdap) 07/09/2032   Pneumococcal Vaccine for age over 58  Completed   Osteoporosis screening with Bone Density Scan  Completed   Hepatitis C Screening  Completed   Zoster (Shingles) Vaccine  Completed   Meningitis B Vaccine  Aged Out       08/23/2024    4:32 PM  Advanced Directives  Does Patient Have a Medical Advance Directive? Yes  Type of Estate Agent of [place];Living will;Out of facility DNR (pink MOST or yellow form)  Copy of Healthcare Power of Attorney in  Chart? No - copy requested    Vision: Annual vision screenings are recommended for early detection of glaucoma, cataracts, and diabetic retinopathy. These exams can also reveal signs of chronic conditions such as diabetes and high blood pressure.  Dental: Annual dental screenings help detect early signs of oral cancer, gum disease, and other conditions linked to overall health, including heart disease and diabetes.  Please see the attached documents for additional preventive care recommendations.

## 2024-08-24 NOTE — Progress Notes (Signed)
 Chief Complaint  Patient presents with   Medicare Wellness     Subjective:   Amber Washington is a 68 y.o. female who presents for a Medicare Annual Wellness Visit.  Visit info / Clinical Intake: Medicare Wellness Visit Type:: Initial Annual Wellness Visit Persons participating in visit and providing information:: patient Medicare Wellness Visit Mode:: Telephone If telephone:: video declined Since this visit was completed virtually, some vitals may be partially provided or unavailable. Missing vitals are due to the limitations of the virtual format.: Documented vitals are patient reported If Telephone or Video please confirm:: I connected with patient using audio/video enable telemedicine. I verified patient identity with two identifiers, discussed telehealth limitations, and patient agreed to proceed. Patient Location:: Home Provider Location:: Home Interpreter Needed?: No Pre-visit prep was completed: yes AWV questionnaire completed by patient prior to visit?: yes Date:: 08/23/24 Living arrangements:: (Patient-Rptd) lives with spouse/significant other Patient's Overall Health Status Rating: (Patient-Rptd) very good Typical amount of pain: (Patient-Rptd) none Does pain affect daily life?: (Patient-Rptd) no Are you currently prescribed opioids?: no  Dietary Habits and Nutritional Risks How many meals a day?: (Patient-Rptd) 3 Eats fruit and vegetables daily?: (Patient-Rptd) yes Most meals are obtained by: (Patient-Rptd) preparing own meals In the last 2 weeks, have you had any of the following?: none Diabetic:: (!) yes Any non-healing wounds?: no How often do you check your BS?: continuous glucose monitor (Dexacom sensor) Would you like to be referred to a Nutritionist or for Diabetic Management? : no  Functional Status Activities of Daily Living (to include ambulation/medication): (Patient-Rptd) Independent Ambulation: Independent with device- listed below Home Assistive  Devices/Equipment: Eyeglasses Medication Administration: (Patient-Rptd) Independent Home Management (perform basic housework or laundry): (Patient-Rptd) Independent Manage your own finances?: (Patient-Rptd) yes Primary transportation is: (Patient-Rptd) driving Concerns about vision?: no *vision screening is required for WTM* Concerns about hearing?: no  Fall Screening Falls in the past year?: (Patient-Rptd) 0 Number of falls in past year: 0 Was there an injury with Fall?: 0 Fall Risk Category Calculator: 0 Patient Fall Risk Level: Low Fall Risk  Fall Risk Patient at Risk for Falls Due to: No Fall Risks Fall risk Follow up: Falls evaluation completed; Falls prevention discussed  Home and Transportation Safety: All rugs have non-skid backing?: (Patient-Rptd) yes All stairs or steps have railings?: (Patient-Rptd) yes Grab bars in the bathtub or shower?: (!) (Patient-Rptd) no Have non-skid surface in bathtub or shower?: (Patient-Rptd) yes Good home lighting?: (Patient-Rptd) yes Regular seat belt use?: (Patient-Rptd) yes Hospital stays in the last year:: (Patient-Rptd) no  Cognitive Assessment Difficulty concentrating, remembering, or making decisions? : (Patient-Rptd) no Will 6CIT or Mini Cog be Completed: no 6CIT or Mini Cog Declined: patient alert, oriented, able to answer questions appropriately and recall recent events  Advance Directives (For Healthcare) Does Patient Have a Medical Advance Directive?: Yes Type of Advance Directive: Healthcare Power of Brethren; Living will; Out of facility DNR (pink MOST or yellow form) Copy of Healthcare Power of Attorney in Chart?: No - copy requested Copy of Living Will in Chart?: No - copy requested Out of facility DNR (pink MOST or yellow form) in Chart? (Ambulatory ONLY): No - copy requested  Reviewed/Updated  Reviewed/Updated: Reviewed All (Medical, Surgical, Family, Medications, Allergies, Care Teams, Patient Goals)    Allergies  (verified) Influenza vaccines, Bactrim  [sulfamethoxazole -trimethoprim ], and Egg protein-containing drug products   Current Medications (verified) Outpatient Encounter Medications as of 08/24/2024  Medication Sig   albuterol  (VENTOLIN  HFA) 108 (90 Base) MCG/ACT inhaler  Inhale 2 puffs into the lungs every 6 (six) hours as needed for wheezing or shortness of breath.   alendronate  (FOSAMAX ) 70 MG tablet TAKE 1 TABLET BY MOUTH EVERY 7 DAYS. TAKE WITH A FULL GLASS OF WATER ON AN EMPTY STOMACH.   ALPRAZolam  (XANAX ) 0.5 MG tablet TAKE 1 TABLET BY MOUTH 3 TIMES DAILY AS NEEDED FOR SLEEP OR FOR ANXIETY   aspirin 81 MG tablet Take 81 mg by mouth daily.   atorvastatin  (LIPITOR) 10 MG tablet TAKE 1 TABLET DAILY *MYLAN*   BD PEN NEEDLE NANO U/F 32G X 4 MM MISC USE 2 TO 3 TIMES A DAY   Blood Glucose Monitoring Suppl (ACCU-CHEK GUIDE) w/Device KIT Use as directed   budesonide -formoterol  (SYMBICORT ) 80-4.5 MCG/ACT inhaler Inhale 2 puffs into the lungs 2 (two) times daily.   Cholecalciferol (VITAMIN D ) 2000 UNITS CAPS Take 1 capsule by mouth daily.   Continuous Glucose Sensor (DEXCOM G7 SENSOR) MISC Use as instructed to check blood sugar. Change every 10 days   empagliflozin  (JARDIANCE ) 10 MG TABS tablet Take 1 tablet (10 mg total) by mouth daily before breakfast.   Glucagon  3 MG/DOSE POWD Place 3 mg into the nose once as needed for up to 1 dose.   glucose blood (ACCU-CHEK GUIDE TEST) test strip Use to check blood sugar 3-4 times a day.   insulin  aspart (NOVOLOG  FLEXPEN) 100 UNIT/ML FlexPen INJECT UNDER SKIN 3 TO 5 UNITS BEFORE DINNER AND LUNCH   LANTUS  SOLOSTAR 100 UNIT/ML Solostar Pen INJECT 8 TO 10 UNITS INTO THE SKIN DAILY.   losartan  (COZAAR ) 50 MG tablet TAKE 1 TABLET (50 MG TOTAL) BY MOUTH DAILY.   metFORMIN  (GLUCOPHAGE -XR) 500 MG 24 hr tablet Take 2 tablets (1,000 mg total) by mouth daily with supper.   montelukast  (SINGULAIR ) 10 MG tablet TAKE 1 TABLET BY MOUTH AT BEDTIME.   RYBELSUS  7 MG TABS TAKE 1  TABLET (7 MG TOTAL) BY MOUTH DAILY.   traZODone  (DESYREL ) 50 MG tablet Take 0.5-1 tablets (25-50 mg total) by mouth at bedtime as needed for sleep.   No facility-administered encounter medications on file as of 08/24/2024.    History: Past Medical History:  Diagnosis Date   Allergic rhinitis    Anxiety state, unspecified    Asthma    Benign neoplasm of pancreas, except islets of Langerhans    Borderline hypertension    Hypercholesteremia    Malignant neoplasm of breast (female), unspecified site    Osteoporosis, unspecified    Personal history of chemotherapy    Personal history of radiation therapy    Stenosis of nasolacrimal duct, acquired    Type II or unspecified type diabetes mellitus without mention of complication, not stated as uncontrolled    Past Surgical History:  Procedure Laterality Date   BREAST BIOPSY     BREAST EXCISIONAL BIOPSY     BREAST LUMPECTOMY     [id number] 1995   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   NASAL SEPTUM SURGERY     right breast cancer sugery  1995   surgery on neck for removal of birthmark     tur induction     Family History  Problem Relation Age of Onset   Hyperlipidemia Mother    Social History   Occupational History   Occupation: RETIRED  Tobacco Use   Smoking status: Never   Smokeless tobacco: Never  Vaping Use   Vaping status: Never Used  Substance and Sexual Activity   Alcohol  use: Yes    Comment: social use   Drug use: Never   Sexual activity: Yes   Tobacco Counseling Counseling given: Not Answered  SDOH Screenings   Food Insecurity: No Food Insecurity (08/24/2024)  Housing: Unknown (08/24/2024)  Transportation Needs: No Transportation Needs (08/24/2024)  Utilities: Not At Risk (08/24/2024)  Depression (PHQ2-9): Low Risk (08/24/2024)  Financial Resource Strain: Low Risk (07/21/2024)  Physical Activity: Inactive (08/24/2024)  Social Connections: Unknown (08/24/2024)   Stress: No Stress Concern Present (08/24/2024)  Tobacco Use: Low Risk (08/24/2024)  Health Literacy: Adequate Health Literacy (08/24/2024)   See flowsheets for full screening details  Depression Screen PHQ 2 & 9 Depression Scale- Over the past 2 weeks, how often have you been bothered by any of the following problems? Little interest or pleasure in doing things: 0 Feeling down, depressed, or hopeless (PHQ Adolescent also includes...irritable): 0 PHQ-2 Total Score: 0 Trouble falling or staying asleep, or sleeping too much: 0 Feeling tired or having little energy: 0 Poor appetite or overeating (PHQ Adolescent also includes...weight loss): 0 Feeling bad about yourself - or that you are a failure or have let yourself or your family down: 0 Trouble concentrating on things, such as reading the newspaper or watching television (PHQ Adolescent also includes...like school work): 0 Moving or speaking so slowly that other people could have noticed. Or the opposite - being so fidgety or restless that you have been moving around a lot more than usual: 0 Thoughts that you would be better off dead, or of hurting yourself in some way: 0 PHQ-9 Total Score: 0 If you checked off any problems, how difficult have these problems made it for you to do your work, take care of things at home, or get along with other people?: Not difficult at all  Depression Treatment Depression Interventions/Treatment : EYV7-0 Score <4 Follow-up Not Indicated     Goals Addressed               This Visit's Progress     Patient Stated (pt-stated)        To stay healthy/2025             Objective:    Today's Vitals   08/24/24 0814  Weight: 150 lb (68 kg)  Height: 5' 8.5 (1.74 m)   Body mass index is 22.48 kg/m.  Hearing/Vision screen Hearing Screening - Comments:: Denies hearing difficulties   Vision Screening - Comments:: Wears eyeglasses/Dr. FABIENE Brandy Immunizations and Health Maintenance Health  Maintenance  Topic Date Due   COVID-19 Vaccine (6 - 2025-26 season) 05/09/2024   OPHTHALMOLOGY EXAM  09/29/2024   FOOT EXAM  12/29/2024   Mammogram  01/04/2025   HEMOGLOBIN A1C  01/22/2025   Diabetic kidney evaluation - Urine ACR  06/24/2025   Diabetic kidney evaluation - eGFR measurement  07/25/2025   Medicare Annual Wellness (AWV)  08/24/2025   Colonoscopy  05/17/2028   DTaP/Tdap/Td (4 - Td or Tdap) 07/09/2032   Pneumococcal Vaccine: 50+ Years  Completed   Bone Density Scan  Completed   Hepatitis C Screening  Completed   Zoster Vaccines- Shingrix   Completed   Meningococcal B Vaccine  Aged Out        Assessment/Plan:  This is a routine wellness examination for Jazzlene.  Patient Care Team: Mahlon Comer BRAVO, MD as PCP - General (Family Medicine) Luis Purchase, MD as Consulting Physician (Gastroenterology) Key, Hargis HERO, NP as Nurse Practitioner (Gynecology) Octavia Charleston, MD as Consulting Physician (Ophthalmology) [facility]  I have personally reviewed and noted the following in the patients chart:   Medical and social history Use of alcohol, tobacco or illicit drugs  Current medications and supplements including opioid prescriptions. Functional ability and status Nutritional status Physical activity Advanced directives List of other physicians Hospitalizations, surgeries, and ER visits in previous 12 months Vitals Screenings to include cognitive, depression, and falls Referrals and appointments  No orders of the defined types were placed in this encounter.  In addition, I have reviewed and discussed with patient certain preventive protocols, quality metrics, and best practice recommendations. A written personalized care plan for preventive services as well as general preventive health recommendations were provided to patient.   Margreat Widener L Leisl Spurrier, CMA   08/24/2024   Return in 1 year (on 08/24/2025).  After Visit Summary: (MyChart) Due to this  being a telephonic visit, the after visit summary with patients personalized plan was offered to patient via MyChart   Nurse Notes: No voiced or noted concerns at this time

## 2024-09-27 ENCOUNTER — Encounter: Payer: Self-pay | Admitting: Internal Medicine

## 2024-12-05 ENCOUNTER — Ambulatory Visit: Admitting: Internal Medicine

## 2025-01-25 ENCOUNTER — Ambulatory Visit: Admitting: Family Medicine
